# Patient Record
Sex: Male | Born: 1945 | Race: White | Hispanic: No | Marital: Single | Smoking: Former smoker
Health system: Southern US, Community
[De-identification: ages and names within clinical notes are randomized; demographics above are authoritative.]

## PROBLEM LIST (undated history)

## (undated) DIAGNOSIS — E119 Type 2 diabetes mellitus without complications: Secondary | ICD-10-CM

## (undated) DIAGNOSIS — I10 Essential (primary) hypertension: Secondary | ICD-10-CM

## (undated) DIAGNOSIS — L80 Vitiligo: Secondary | ICD-10-CM

## (undated) DIAGNOSIS — N189 Chronic kidney disease, unspecified: Secondary | ICD-10-CM

## (undated) DIAGNOSIS — J449 Chronic obstructive pulmonary disease, unspecified: Secondary | ICD-10-CM

## (undated) DIAGNOSIS — I4891 Unspecified atrial fibrillation: Secondary | ICD-10-CM

## (undated) HISTORY — DX: Vitiligo: L80

## (undated) HISTORY — DX: Essential (primary) hypertension: I10

## (undated) HISTORY — PX: MELANOMA EXCISION: SHX5266

## (undated) HISTORY — DX: Chronic obstructive pulmonary disease, unspecified: J44.9

## (undated) HISTORY — DX: Unspecified atrial fibrillation: I48.91

## (undated) HISTORY — DX: Type 2 diabetes mellitus without complications: E11.9

## (undated) HISTORY — DX: Chronic kidney disease, unspecified: N18.9

---

## 2010-06-24 ENCOUNTER — Emergency Department (HOSPITAL_COMMUNITY): Payer: BC Managed Care – PPO

## 2010-06-24 ENCOUNTER — Inpatient Hospital Stay (HOSPITAL_COMMUNITY)
Admission: EM | Admit: 2010-06-24 | Discharge: 2010-06-25 | DRG: 294 | Disposition: A | Discharge status: Home / Self Care | Payer: BC Managed Care – PPO | Attending: Internal Medicine | Admitting: Internal Medicine

## 2010-06-24 DIAGNOSIS — J40 Bronchitis, not specified as acute or chronic: Secondary | ICD-10-CM | POA: Diagnosis present

## 2010-06-24 DIAGNOSIS — Z9641 Presence of insulin pump (external) (internal): Secondary | ICD-10-CM

## 2010-06-24 DIAGNOSIS — E039 Hypothyroidism, unspecified: Secondary | ICD-10-CM | POA: Diagnosis present

## 2010-06-24 DIAGNOSIS — I10 Essential (primary) hypertension: Secondary | ICD-10-CM | POA: Diagnosis present

## 2010-06-24 DIAGNOSIS — E860 Dehydration: Secondary | ICD-10-CM | POA: Diagnosis present

## 2010-06-24 DIAGNOSIS — D72829 Elevated white blood cell count, unspecified: Secondary | ICD-10-CM | POA: Diagnosis present

## 2010-06-24 DIAGNOSIS — R748 Abnormal levels of other serum enzymes: Secondary | ICD-10-CM | POA: Diagnosis present

## 2010-06-24 DIAGNOSIS — J4489 Other specified chronic obstructive pulmonary disease: Secondary | ICD-10-CM | POA: Diagnosis present

## 2010-06-24 DIAGNOSIS — E1010 Type 1 diabetes mellitus with ketoacidosis without coma: Principal | ICD-10-CM | POA: Diagnosis present

## 2010-06-24 DIAGNOSIS — J449 Chronic obstructive pulmonary disease, unspecified: Secondary | ICD-10-CM | POA: Diagnosis present

## 2010-06-24 DIAGNOSIS — Z794 Long term (current) use of insulin: Secondary | ICD-10-CM

## 2010-06-24 LAB — DIFFERENTIAL
Blasts: 0 %
Metamyelocytes Relative: 0 %
Monocytes Absolute: 0.5 10*3/uL (ref 0.1–1.0)
Monocytes Relative: 3 % (ref 3–12)
Promyelocytes Absolute: 0 %
WBC Morphology: INCREASED
nRBC: 0 /100 WBC

## 2010-06-24 LAB — URINALYSIS, ROUTINE W REFLEX MICROSCOPIC
Glucose, UA: >1000 mg/dL — AB
Hgb urine dipstick: NEGATIVE
Leukocytes, UA: NEGATIVE
Specific Gravity, Urine: 1.026 (ref 1.005–1.030)
Urobilinogen, UA: 0.2 mg/dL (ref 0.0–1.0)

## 2010-06-24 LAB — BASIC METABOLIC PANEL
BUN: 36 mg/dL — ABNORMAL HIGH (ref 6–23)
BUN: 41 mg/dL — ABNORMAL HIGH (ref 6–23)
BUN: 40 mg/dL — ABNORMAL HIGH (ref 6–23)
CO2: 17 mEq/L — ABNORMAL LOW (ref 19–32)
CO2: 26 mEq/L (ref 19–32)
CO2: 25 mEq/L (ref 19–32)
Calcium: 7.9 mg/dL — ABNORMAL LOW (ref 8.4–10.5)
Calcium: 7.8 mg/dL — ABNORMAL LOW (ref 8.4–10.5)
Calcium: 7.8 mg/dL — ABNORMAL LOW (ref 8.4–10.5)
Chloride: 99 mEq/L (ref 96–112)
Chloride: 105 mEq/L (ref 96–112)
Creatinine, Ser: 1.61 mg/dL — ABNORMAL HIGH (ref 0.4–1.5)
Creatinine, Ser: 1.51 mg/dL — ABNORMAL HIGH (ref 0.4–1.5)
GFR calc Af Amer: 52 mL/min — ABNORMAL LOW (ref >60)
GFR calc non Af Amer: 43 mL/min — ABNORMAL LOW (ref >60)
GFR calc non Af Amer: 44 mL/min — ABNORMAL LOW (ref >60)
Glucose, Bld: 594 mg/dL (ref 70–99)
Glucose, Bld: 311 mg/dL — ABNORMAL HIGH (ref 70–99)
Glucose, Bld: 272 mg/dL — ABNORMAL HIGH (ref 70–99)
Glucose, Bld: 203 mg/dL — ABNORMAL HIGH (ref 70–99)
Potassium: 5.3 mEq/L — ABNORMAL HIGH (ref 3.5–5.1)
Potassium: 4.1 mEq/L (ref 3.5–5.1)
Sodium: 135 mEq/L (ref 135–145)
Sodium: 136 mEq/L (ref 135–145)

## 2010-06-24 LAB — RAPID URINE DRUG SCREEN, HOSP PERFORMED
Amphetamines: NOT DETECTED
Barbiturates: NOT DETECTED
Benzodiazepines: NOT DETECTED
Opiates: NOT DETECTED

## 2010-06-24 LAB — GLUCOSE, CAPILLARY
Glucose-Capillary: 339 mg/dL — ABNORMAL HIGH (ref 70–99)
Glucose-Capillary: 233 mg/dL — ABNORMAL HIGH (ref 70–99)
Glucose-Capillary: 210 mg/dL — ABNORMAL HIGH (ref 70–99)

## 2010-06-24 LAB — CARDIAC PANEL(CRET KIN+CKTOT+MB+TROPI)
CK, MB: 7.5 ng/mL (ref 0.3–4.0)
Total CK: 136 U/L (ref 7–232)

## 2010-06-24 LAB — CBC
HCT: 45.2 % (ref 39.0–52.0)
Hemoglobin: 15 g/dL (ref 13.0–17.0)
MCV: 97 fL (ref 78.0–100.0)
RBC: 4.66 MIL/uL (ref 4.22–5.81)
WBC: 16.6 10*3/uL — ABNORMAL HIGH (ref 4.0–10.5)

## 2010-06-24 LAB — PROTIME-INR
INR: 1.07 (ref 0.00–1.49)
Prothrombin Time: 14.1 seconds (ref 11.6–15.2)

## 2010-06-24 LAB — ETHANOL: Alcohol, Ethyl (B): <5 mg/dL (ref 0–10)

## 2010-06-24 LAB — URINE MICROSCOPIC-ADD ON

## 2010-06-25 ENCOUNTER — Inpatient Hospital Stay (HOSPITAL_COMMUNITY): Payer: BC Managed Care – PPO

## 2010-06-25 DIAGNOSIS — R7989 Other specified abnormal findings of blood chemistry: Secondary | ICD-10-CM

## 2010-06-25 LAB — CARDIAC PANEL(CRET KIN+CKTOT+MB+TROPI)
CK, MB: 9.4 ng/mL (ref 0.3–4.0)
CK, MB: 10.3 ng/mL (ref 0.3–4.0)
Relative Index: 5.3 — ABNORMAL HIGH (ref 0.0–2.5)
Total CK: 176 U/L (ref 7–232)
Total CK: 184 U/L (ref 7–232)

## 2010-06-25 LAB — DIFFERENTIAL
Basophils Relative: 0 % (ref 0–1)
Eosinophils Absolute: 0.3 10*3/uL (ref 0.0–0.7)
Eosinophils Relative: 2 % (ref 0–5)
Lymphs Abs: 1.9 10*3/uL (ref 0.7–4.0)
Monocytes Relative: 7 % (ref 3–12)

## 2010-06-25 LAB — SODIUM, URINE, RANDOM: Sodium, Ur: 33 mEq/L

## 2010-06-25 LAB — BASIC METABOLIC PANEL
BUN: 36 mg/dL — ABNORMAL HIGH (ref 6–23)
CO2: 22 mEq/L (ref 19–32)
CO2: 26 mEq/L (ref 19–32)
Calcium: 7.8 mg/dL — ABNORMAL LOW (ref 8.4–10.5)
Chloride: 107 mEq/L (ref 96–112)
Creatinine, Ser: 1.09 mg/dL (ref 0.4–1.5)
GFR calc Af Amer: >60 mL/min (ref >60)
Glucose, Bld: 296 mg/dL — ABNORMAL HIGH (ref 70–99)
Glucose, Bld: 283 mg/dL — ABNORMAL HIGH (ref 70–99)
Sodium: 138 mEq/L (ref 135–145)
Sodium: 137 mEq/L (ref 135–145)

## 2010-06-25 LAB — CBC
MCH: 31.5 pg (ref 26.0–34.0)
MCHC: 33.1 g/dL (ref 30.0–36.0)
MCV: 95.1 fL (ref 78.0–100.0)
Platelets: 263 10*3/uL (ref 150–400)
RDW: 13.8 % (ref 11.5–15.5)

## 2010-06-25 LAB — GLUCOSE, CAPILLARY: Glucose-Capillary: 193 mg/dL — ABNORMAL HIGH (ref 70–99)

## 2010-06-25 LAB — COMPREHENSIVE METABOLIC PANEL
Albumin: 3 g/dL — ABNORMAL LOW (ref 3.5–5.2)
BUN: 37 mg/dL — ABNORMAL HIGH (ref 6–23)
Calcium: 7.9 mg/dL — ABNORMAL LOW (ref 8.4–10.5)
Creatinine, Ser: 1.15 mg/dL (ref 0.4–1.5)
GFR calc Af Amer: >60 mL/min (ref >60)
Total Bilirubin: 1.2 mg/dL (ref 0.3–1.2)
Total Protein: 5.2 g/dL — ABNORMAL LOW (ref 6.0–8.3)

## 2010-06-25 LAB — TSH: TSH: 0.708 u[IU]/mL (ref 0.350–4.500)

## 2010-06-25 LAB — HEMOGLOBIN A1C
Hgb A1c MFr Bld: 10.1 % — ABNORMAL HIGH (ref <5.7)
Mean Plasma Glucose: 243 mg/dL — ABNORMAL HIGH (ref <117)

## 2010-06-26 LAB — URINE CULTURE

## 2010-06-27 NOTE — Discharge Summary (Signed)
  [name], GRAND               ACCOUNT NO.:  [id number]  MEDICAL RECORD NO.:  [id number]           PATIENT TYPE:  I  LOCATION:  1443                         FACILITY:  [facility]  PHYSICIAN:  Conley Canal, MD      DATE OF BIRTH:  February 21, 1946  DATE OF ADMISSION:  06/24/2010 DATE OF DISCHARGE:                         DISCHARGE SUMMARY-REFERRING   PRIMARY CARE PHYSICIAN:  [place], [place].  CONSULTING PHYSICIAN:  [facility].  DISCHARGE DIAGNOSES: 1. Diabetic ketoacidosis. 2. Acute bronchitis. 3. Leukocytosis, combination of #1 and #2. 4. Positive cardiac enzymes, possibly related to leak related to     diabetic ketoacidosis. 5. Hypertension. 6. Hypothyroidism.  DISCHARGE MEDICATIONS: 1. Levaquin 750 mg daily for 7 more days. 2. Advair 250/50 one puff twice daily. 3. Cardizem CD 120 mg daily. 4. Combivent 2 puffs twice daily. 5. Hydrochlorothiazide 50 mg daily. 6. Insulin pump per PCP instructions. 7. Spiriva 18 mcg inhalations daily. 8. Synthroid 50 mcg daily.  PROCEDURES PERFORMED:  Chest x-ray, June 24, 2010 and June 25, 2010, showed bronchitic changes with no evidence of pneumonia.  HOSPITAL COURSE:  This is a very pleasant unfortunate 65 year old male diabetic with a history of COPD, came to the hospital with complaints of feeling like he was in DKA.  He said that his insulin pump malfunctioned.  He also admitted to history of cough, nausea, vomiting, dry heaves, and at the time of admission, he was noted to have a white count of 16,000, blood sugar 594, and bicarbonate 17.  He was also in prerenal azotemia with a BUN of 36, creatinine 1.73.  The patient was hence appropriately started on IV fluids, insulin per protocol.  His cardiac enzymes were also positive, resulting in Cardiology consult. Cardiology felt that this was enzyme leak related to the DKA. Apparently, the patient had a cardiac cath about 1-1/2 years ago which was noted to be normal  in [place].  Today, the patient feels better. He is eager to go home because he lost his mom and funeral is scheduled for the morning and he has requested discharge to attend the funeral. At this point, he seems hemodynamically stable.  We will plan to discharge him and to have him follow with his PCP in the next week or 2. He was discharged on Levaquin for the bronchitis.  The patient was discharged in stable condition.  The time spent for discharge preparation is less than 30 minutes.     Conley Canal, MD     SR/MEDQ  D:  06/25/2010  T:  06/25/2010  Job:  [id number]  Electronically Signed by Conley Canal  on 06/26/2010 08:12:30 PM

## 2010-07-03 NOTE — H&P (Signed)
Arthur [name], Arthur NO.:  [id number]  MEDICAL RECORD NO.:  [id number]           PATIENT TYPE:  E  LOCATION:  WLED                         FACILITY:  [facility]  PHYSICIAN:  Ramiro Harvest, MD    DATE OF BIRTH:  Sep 28, 1945  DATE OF ADMISSION:  06/24/2010 DATE OF DISCHARGE:                             HISTORY & PHYSICAL   PRIMARY CARE PHYSICIAN:  Dr. Hope Budds of [place], [place].  CHIEF COMPLAINT:  "I think I have diabetic ketoacidosis."  HISTORY OF PRESENT ILLNESS:  Tremont Gavitt is a 65 year old Caucasian gentleman, a history of insulin-dependent diabetes x10-15 years on an insulin pump, history of hypertension, hypothyroidism, presenting to the ED saying "I'm in DKA."  The patient states his insulin pump has been malfunctioning for the past 2 days and he is here for his mother' funeral.  The patient has been having a 1-day history of nausea, weakness, dry heaves, and sore throat which has since resolved.  The patient is here from [place].  The patient states he checked his CBGs and they were in the 500s.  The patient also complaining of disorientation.  The patient denies any fever, no chills, no chest pain, no shortness of breath, no abdominal pain, no diarrhea, no constipation, no dysuria, no headache, no visual symptoms.  No melena, no hematemesis, no hematochezia.  No focal neurological symptoms.  The patient was seen in the ED.  Labs obtained; had a BMET with a potassium of 5.3, bicarb of 17, glucose of 594, BUN of 36, creatinine of 1.73.  CBC with a white count of 16.6; otherwise, was within normal limits.  Alcohol level of less than 5.  The patient was given some IV fluids.  Chest x-ray which was done showed chronic bronchitis and emphysema with no active disease. We were called to admit the patient for further evaluation and management.  ALLERGIES:  No known drug allergies.  PAST MEDICAL HISTORY: 1. Insulin-dependent diabetes  mellitus x10-15 years, on an insulin     pump. 2. Questionable component of asthma. 3. COPD. 4. Hypertension. 5. Hypothyroidism.  HOME MEDICATIONS: 1. Insulin pump. 2. Cardizem CD 120 mg p.o. q. daily. 3. Synthroid 50 mcg p.o. q.a.m. 4. HCTZ 50 mg p.o. q. daily. 5. Spiriva 18 mcg inhalation daily. 6. Combivent 2 puffs twice daily. 7. Advair Diskus 250/50 one puff twice daily.  SOCIAL HISTORY:  The patient lives in [place] and is here for his mother's funeral.  Smoked cigarettes for 25 years, but has quit for the past 20 years.  Drinks about two beers a day.  No IV drug use.  FAMILY HISTORY:  Mother deceased, age 40, from old age.  Father deceased, age 107, from carcinoma of the bladder/leukemia.  REVIEW OF SYSTEMS:  As per HPI, otherwise negative.  PHYSICAL EXAM:  VITAL SIGNS:  Temperature 98.5, blood pressure 122/59, pulse of 112, respiratory rate 18, satting 94%. GENERAL:  The patient is a well-developed, well-nourished gentleman in no acute cardiopulmonary distress. HEENT:  Normocephalic, atraumatic.  Pupils are equal, round, and reactive to light and accommodation.  Extraocular movements intact. Oropharynx is clear.  No  lesions, no exudates. NECK:  Supple.  No lymphadenopathy.  Extremely dry mucous membranes. RESPIRATORY:  Lungs are clear to auscultation bilaterally.  No wheezes, no crackles, no rhonchi. CARDIOVASCULAR:  Tachycardic, regular rhythm. ABDOMEN:  Soft, nontender, nondistended.  Positive bowel sounds. EXTREMITIES:  No clubbing, cyanosis, or edema. NEUROLOGICALLY:  The patient is alert and oriented x3.  Cranial nerves 2- 12 are grossly intact with no focal deficits.  ADMISSION LABS:  CBC:  White count of 16.6, hemoglobin 15.0, hematocrit 45.2, platelet count of 242 with an ANC of 13.8.  BMET with a sodium of 135, potassium 5.3, chloride 99, bicarb 17, glucose 594, BUN 36, creatinine 1.73, and a calcium of 8.4.  Alcohol level of less than 5.  Chest x-ray  shows chronic bronchitis and emphysema.  No active disease.  ASSESSMENT AND PLAN:  Mr. Ronrico Dupin is a 65 year old gentleman, a history of insulin-dependent type 1 diabetes x10-15 years on an insulin pump presenting to the emergency department stating that he is in early diabetic ketoacidosis. 1. Mild early diabetic ketoacidosis.  The patient does have an anion     gap of 19.  It is likely secondary to his malfunctioning insulin     pump versus infectious etiology versus acute coronary syndrome.     Will admit the patient to Telemetry.  Will place on a glucose     stabilizer.  Will cycle cardiac enzymes q.8 h. x3.  Check a     urinalysis with cultures and sensitivities.  Check a chest x-ray;     it is negative.  Will repeat chest x-ray in the morning.  Once the     patient is hydrated, check the lipase level.  Check serum ketones.     Check a hemoglobin A1c.  Check a magnesium level.  Check CBGs q.1     hour.  Check a BMET q.2 hours.  Follow glucose stabilizer, IV     fluids, and monitor.  Once his anion gap is closed, we will overlap     with 35 units of Lantus follow and diabetes education. 2. Dehydration.  Hydrate with intravenous fluids. 3. Leukocytosis.  Likely a reactive leukocytosis.  Will check a     urinalysis with cultures and sensitivities.  Check a chest x-ray;     it has been negative.  We will repeat chest x-ray in the morning to     rule out pneumonia.  Once the patient is hydrated, we will monitor     to make sure nothing fluffs out.  Will hold off on antibiotics for     now and follow. 4. Insulin-dependent diabetes mellitus type 1 x10-15 years.  Will     check a hemoglobin A1c, sliding scale insulin.  See problem #1. 5. Chronic obstructive pulmonary disease, stable.  Continue Advair,     Spiriva, and Combivent. 6. Hypothyroidism.  Check a TSH.  Continue Synthroid. 7. Acute renal failure.  Likely secondary to prerenal azotemia     secondary to problems #1 and #2 with  underlying diuretic use.  Will     check a fractional excretion of sodium hydrate with intravenous     fluids.  If no improvement, check a renal ultrasound. 8. Hyperkalemia secondary to #1.  Should correct with the treatment of     diabetic ketoacidosis.  Will follow for     now. 9. Prophylaxis.  Pepcid for gastrointestinal prophylaxis.  Heparin for     deep vein thrombosis prophylaxis.  It has been  a pleasure taking care of Mr. Shloma Roggenkamp.     Ramiro Harvest, MD     DT/MEDQ  D:  06/24/2010  T:  06/24/2010  Job:  [id number]  cc:   Dr. Gwendolyn Grant, [facility]  Electronically Signed by Ramiro Harvest MD on 07/03/2010 08:11:31 PM

## 2010-07-09 NOTE — Consult Note (Signed)
[name], [name]               ACCOUNT NO.:  [id number]  MEDICAL RECORD NO.:  [id number]           PATIENT TYPE:  I  LOCATION:  1443                         FACILITY:  [facility]  PHYSICIAN:  Colleen Can. Deborah Chalk, M.D.DATE OF BIRTH:  03-Mar-1946  DATE OF CONSULTATION:  06/25/2010 DATE OF DISCHARGE:                                CONSULTATION   HISTORY OF PRESENT ILLNESS:  Thank you much for asking me to see Arthur [name].  Mr. Goeken is a 65 year old male with history of insulin-dependent diabetes mellitus for approximately 15 years and on insulin pump.  He has a history of hypertension and hypothyroidism.  He was in [place] with the death of his mother.  He had malfunction of his tubing of his insulin pump and before he realized he was in mild DKA and presented with a bicarb of 17 with a glucose of 594 and BUN of 36.  He was given IV fluids.  Subsequent cardiac enzymes came back mildly elevated and I am asked to see him in this regard.  His cardiac enzymes had shown an MB fraction that started at 7.5 and increased to 9.4 and subsequently 10.3. Troponins have been reasonably consisted of 0.12, 0.16, and 0.11.  EKG has remained normal.  It is interesting by history he had a similar DKA admission approximately a year and a half ago and had positive cardiac enzymes and had catheterizations at that time and by his description was normal.  ALLERGIES:  None.  PAST MEDICAL HISTORY: 1. Diabetes for 10-15 years. 2. Questionable COPD and asthma. 3. Hypertension. 4. Hypothyroidism.  HOME MEDICINES: 1. Insulin pump. 2. Cardizem CD 120 per day. 3. Synthroid 50 mcg p.o. daily. 4. HCTZ 50 mg daily. 5. Spiriva, Combivent, and Advair.  SOCIAL HISTORY:  His father was positioning in [place] for a long time.  We smoke about him.  His mother recently died.  He smokes cigarettes for 25 years but stopped for last 20.  He drinks about 2 beers a day.  FAMILY HISTORY:  His father died of  leukemia.  Mother died at 53 from old age.  REVIEW OF SYSTEMS:  As noted above.  PHYSICAL EXAMINATION:  VITAL SIGNS:  Temperature is 98, blood pressure is 122/59, heart rate 70. HEENT:  He is normocephalic and atraumatic.  Pupils equal, round, and reactive to light. LUNGS:  Clear. HEART:  Regular rate and rhythm. ABDOMEN:  Soft, nontender. EXTREMITIES:  Without edema.  OVERALL ASSESSMENT: 1. Positive cardiac enzymes related to diabetic ketoacidosis. 2. Insulin-dependent diabetes mellitus. 3. Chronic obstructive pulmonary disease. 4. Hypothyroidism. 5. History of hypertension.  PLAN:  In light of the findings, this is all consistent with DKA.  I have especially reassured him the fact that he had normal catheterization within the last 2 years and predicted associated with DKA.  I think it is reasonable for him to be discharged at this point in time with no further followup.     Colleen Can. Deborah Chalk, M.D.     SNT/MEDQ  D:  06/25/2010  T:  06/26/2010  Job:  [id number]  cc:   Hope Budds, M.D. [employer]  Electronically Signed by Roger Shelter M.D. on 07/09/2010 04:16:31 PM

## 2017-06-01 ENCOUNTER — Encounter (HOSPITAL_COMMUNITY): Payer: Self-pay

## 2017-06-01 ENCOUNTER — Telehealth (HOSPITAL_COMMUNITY): Payer: Self-pay
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
## 2017-06-07 ENCOUNTER — Telehealth (HOSPITAL_COMMUNITY): Payer: Self-pay

## 2017-06-08 ENCOUNTER — Telehealth (HOSPITAL_COMMUNITY): Payer: Self-pay

## 2017-06-08 ENCOUNTER — Encounter (HOSPITAL_COMMUNITY): Payer: Self-pay

## 2017-06-08 NOTE — Progress Notes (Signed)
Patient called back to discuss pulmonary rehab referral. Stated that he no longer wants to do the "undergrad" program and request to do the maintenance pulmonary rehab program. RN questioned patient because patient had stated in previous conversation that he could not afford 100.00/month for maintenance program and "wanted it all paid for". Patient also stated in previous conversation that [place] was too costly and [facility] only charged 30.00/month. Patient stated that RN misunderstood previous conversation and he was willing to pay whatever he had to to come to the "graduate" (maintenance) program. RN explained to patient that she would have to check with Maintenance RN coordinator to see if there was a spot available in the class, a new order would need to be signed by the MD for Pulmonary Rehab Maintenance, and maintenance classes were only on Tuesdays and Thursdays at 0930. Patient became agitated and stated "you mean to tell me I cant come on a Saturday or any other day". RN explained that we do not have an open gym and M,W,and F were Cardiac Rehab days. RN confirmed with patient that she would discard undergrad order and seek order from MD for maintenance. Patient said "NO, I am coming to the program. You call me when you have something for me" and hung up the phone on the RN.

## 2018-11-14 ENCOUNTER — Encounter: Payer: Self-pay | Admitting: Internal Medicine

## 2018-11-14 ENCOUNTER — Other Ambulatory Visit: Payer: Self-pay

## 2018-11-14 ENCOUNTER — Ambulatory Visit (INDEPENDENT_AMBULATORY_CARE_PROVIDER_SITE_OTHER): Payer: Medicare Other | Admitting: Internal Medicine

## 2018-11-14 DIAGNOSIS — J449 Chronic obstructive pulmonary disease, unspecified: Secondary | ICD-10-CM | POA: Diagnosis not present

## 2018-11-14 DIAGNOSIS — J9611 Chronic respiratory failure with hypoxia: Secondary | ICD-10-CM

## 2018-11-14 MED ORDER — TRELEGY ELLIPTA 100-62.5-25 MCG/INH IN AEPB: 1.0000 | INHALATION_SPRAY | Freq: Every day | RESPIRATORY_TRACT | 0 refills | Status: DC

## 2018-11-14 MED ORDER — TRELEGY ELLIPTA 100-62.5-25 MCG/INH IN AEPB: 1.0000 | INHALATION_SPRAY | Freq: Every day | RESPIRATORY_TRACT | 1 refills | Status: DC

## 2018-11-14 MED ORDER — FLUTTER DEVI: 0 refills | Status: DC

## 2018-11-14 NOTE — Progress Notes (Addendum)
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 Pulmonary Consult  Dyspnea:  Indolent onset progressive doe abruptly worse winter of 2019  s pattern of aecopd :  Baseline= Walks up to 5lpm pulsed  Does not check sat level At rest no sob. Cough: esp in am very thick white.  Sleep: bipap at hs /3lpm SABA use: alb 3 x daily Bad cns effects of prednisone in past     No obvious day to day or daytime variability or assoc  purulent sputum or mucus plugs or hemoptysis or cp or chest tightness, subjective wheeze or overt sinus or hb symptoms.   Sleeping as above without nocturnal  or early am exacerbation  of respiratory  c/o's or need for noct saba. Also denies any obvious fluctuation of symptoms with weather or environmental changes or other aggravating or alleviating factors except as outlined above   No unusual exposure hx or h/o childhood pna/ asthma or knowledge of premature birth.  Current Allergies, Complete Past Medical History, Past Surgical History, Family History, and Social History were reviewed in [employer] record.  ROS  The following are not active complaints unless bolded Hoarseness, sore throat, dysphagia, dental problems, itching, sneezing,  nasal congestion or discharge of excess mucus or purulent secretions, ear ache,   fever, chills, sweats, unintended wt loss or wt gain, classically pleuritic or exertional cp,  orthopnea pnd or arm/hand swelling  or leg swelling, presyncope, palpitations, abdominal pain, anorexia, nausea, vomiting, diarrhea  or change in bowel habits or  change in bladder habits, change in stools or change in urine, dysuria, hematuria,  rash, arthralgias, visual complaints, headache, numbness, weakness or ataxia or problems with walking or coordination,  change in mood or  Memory. Tremor worse with saba             No past medical history on file.  Outpatient Medications Prior to Visit  Medication Sig Dispense Refill  . albuterol (VENTOLIN HFA) 108 (90 Base) MCG/ACT inhaler Inhale 2 puffs into the lungs every 6 (six) hours as needed for wheezing or shortness of breath.    . diltiazem (CARDIZEM) 120 MG tablet Take 120 mg by mouth daily.    . flecainide (TAMBOCOR) 100 MG tablet Take 100 mg by mouth 2 (two) times daily.    . Fluticasone-Salmeterol (ADVAIR) 500-50 MCG/DOSE AEPB Inhale 1 puff into the lungs 2 (two) times daily.    . insulin aspart (NOVOLOG) 100 UNIT/ML injection Inject into the skin as directed.    Marland Kitchen levothyroxine (SYNTHROID) 88 MCG tablet Take 88 mcg by mouth daily before breakfast.    . OXYGEN 3lpm with exertion    . potassium chloride SA (K-DUR) 20 MEQ tablet Take 20 mEq by mouth daily.    Marland Kitchen SPIRIVA HANDIHALER 18 MCG inhalation capsule Place 1 capsule into inhaler and inhale daily.    . temazepam (RESTORIL) 30 MG capsule Take 30 mg by mouth at bedtime as needed for sleep.    Marland Kitchen torsemide (DEMADEX) 10 MG tablet  Take 10 mg by mouth daily.    Marland Kitchen UNABLE TO FIND Med Name: BIPAP with 3lpm o2    . Tiotropium Bromide Monohydrate (SPIRIVA RESPIMAT) 1.25 MCG/ACT AERS Inhale into the lungs.        Objective:     BP (!) 126/58 (BP Location: Left Arm, Cuff Size: Normal)   Pulse 85   Temp 97.9 F (36.6 C) (Oral)   Ht 5\' 10"  (1.778 m)   Wt 149 lb (67.6 kg)   SpO2 96%   BMI 21.38 kg/m   SpO2: 96 % O2 Type: Continuous O2 O2 Flow Rate (L/min): 3 L/min     HEENT: nl dentition / oropharynx. Nl external ear canals without cough reflex -  Mild bilateral non-specific turbinate edema     NECK :  without JVD/Nodes/TM/ nl carotid  upstrokes bilaterally   LUNGS: no acc muscle use,  Mod barrel  contour chest wall with bilateral  Distant bs s audible wheeze and  without cough on insp or exp maneuver and mod  Hyperresonant  to  percussion bilaterally     CV:  RRR  no s3 or murmur or increase in P2, and no edema   ABD:  soft and nontender with pos mid insp Hoover's  in the supine position. No bruits or organomegaly appreciated, bowel sounds nl  MS:     ext warm without deformities, calf tenderness, cyanosis or clubbing No obvious joint restrictions   SKIN: warm and dry without lesions    NEURO:  alert, approp, nl sensorium with  no motor   deficits apparent - resting tremor noted.       Dr south's office cxr "a month or two ago" ok per pt       Assessment   COPD GOLD ?  Quit smoking 1990  - w/u by Dr Koren Bound [place] ? Alpha one AT status  - 11/14/2018  After extensive coaching inhaler device,  effectiveness =    90% with elipta so changed to Trelegy - 11/14/2018 trained on flutter valve use   DDX of  difficult airways management almost all start with A and  include Adherence, Ace Inhibitors, Acid Reflux, Active Sinus Disease, Alpha 1 Antitripsin deficiency, Anxiety masquerading as Airways dz,  ABPA,  Allergy(esp in young), Aspiration (esp in elderly), Adverse effects of meds,  Active smoking or vaping, A bunch of PE's (a small clot burden can't cause this syndrome unless there is already severe underlying pulm or vascular dz with poor reserve) plus two Bs  = Bronchiectasis and Beta blocker use..and one C= CHF  Adherence is always the initial "prime suspect" and is a multilayered concern that requires a "trust but verify" approach in every patient - starting with knowing how to use medications, especially inhalers, correctly, keeping up with refills and understanding the fundamental difference between maintenance and prns vs those medications only taken for a very short course and then stopped and not refilled.   - see hfa teaching - return with all meds in hand using a trust but verify approach to confirm accurate Medication  Reconciliation The principal here is that until we are certain that the  patients are doing what we've asked, it makes no sense to ask them to do more.    ? Adverse effects of meds, esp high dose advair /spiriva dpi > d/c both and try trelegy sample, fill if tol better   ? Alpha one AT def > need to be sure screening done, recs requested   ?  Anxiety/depression/ deconditioning  > usually at the bottom of this list of usual suspects but should be included may interfere with adherence and also interpretation of response or lack thereof to symptom management which can be quite subjective.   ? Allergy/ asthma component > unlikely s tendency at aecopd   ? Bronchiectasis > add flutter valve empirically, consider hrct next ov       Chronic respiratory failure with hypoxia ([place]) 0 2 dep 24/7 @ 3lpm baseline, up to 5 lpm with activity as of 11/14/2018   Adequate control on present rx, reviewed in detail with pt > no change in rx needed  But advised to monitor sats walking     Total time devoted to counseling  > 50 % of initial 60 min office visit:  reviewedcase with pt/  performed device teaching  using a teach back technique which also  extended face to face time for this visit (see above) discussion of options/alternatives/ personally creating written customized instructions  in presence of pt  then going over those specific  Instructions directly with the pt including how to use all of the meds but in particular covering each new medication in detail and the difference between the maintenance= "automatic" meds and the prns using an action plan format for the latter (If this problem/symptom => do that organization reading Left to right).  Please see AVS from this visit for a full list of these instructions which I personally wrote for this pt and  are unique to this visit.       Christinia Gully, MD 11/14/2018

## 2018-11-14 NOTE — Patient Instructions (Addendum)
Plan A = Automatic =  Trelegy one click each am  - take 2 good drags   Plan B = Backup for breathing Only use your albuterol inhaler as a rescue medication to be used if you can't catch your breath by resting or doing a relaxed purse lip breathing pattern.  - The less you use it, the better it will work when you need it. - Ok to use the inhaler up to 2 puffs  every 4 hours if you must but call for appointment if use goes up over your usual need - Don't leave home without it !!  (think of it like the spare tire for your car)     Plan B = backup for cough mucinex or mucinex dm up to 1200 mg every 12 hours and use the flutter valve as possible    Please schedule a follow up visit in 3 months but call sooner if needed for pfts  - add needs alpha one screening on return

## 2018-11-15 ENCOUNTER — Encounter: Payer: Self-pay | Admitting: Internal Medicine

## 2018-11-15 DIAGNOSIS — J9611 Chronic respiratory failure with hypoxia: Secondary | ICD-10-CM | POA: Insufficient documentation

## 2018-11-15 NOTE — Assessment & Plan Note (Addendum)
Quit smoking 1990  - w/u by Dr Koren [place] ? Alpha one AT status  - 11/14/2018  After extensive coaching inhaler device,  effectiveness =    90% with elipta so changed to Trelegy - 11/14/2018 trained on flutter valve use   DDX of  difficult airways management almost all start with A and  include Adherence, Ace Inhibitors, Acid Reflux, Active Sinus Disease, Alpha 1 Antitripsin deficiency, Anxiety masquerading as Airways dz,  ABPA,  Allergy(esp in young), Aspiration (esp in elderly), Adverse effects of meds,  Active smoking or vaping, A bunch of PE's (a small clot burden can't cause this syndrome unless there is already severe underlying pulm or vascular dz with poor reserve) plus two Bs  = Bronchiectasis and Beta blocker use..and one C= CHF  Adherence is always the initial "prime suspect" and is a multilayered concern that requires a "trust but verify" approach in every patient - starting with knowing how to use medications, especially inhalers, correctly, keeping up with refills and understanding the fundamental difference between maintenance and prns vs those medications only taken for a very short course and then stopped and not refilled.  - see hfa teaching - return with all meds in hand using a trust but verify approach to confirm accurate Medication  Reconciliation The principal here is that until we are certain that the  patients are doing what we've asked, it makes no sense to ask them to do more.    ? Adverse effects of meds, esp high dose advair /spiriva dpi > d/c both and try trelegy sample, fill if tol better   ? Alpha one AT def > need to be sure screening done, recs requested   ? Anxiety/depression/ deconditioning  > usually at the bottom of this list of usual suspects but should be included may interfere with adherence and also interpretation of response or lack thereof to symptom management which can be quite subjective.   ? Allergy/ asthma component > unlikely s tendency at  aecopd   ? Bronchiectasis > add flutter valve empirically, consider hrct next ov

## 2018-11-15 NOTE — Assessment & Plan Note (Signed)
0 2 dep 24/7 @ 3lpm baseline, up to 5 lpm with activity as of 11/14/2018   Adequate control on present rx, reviewed in detail with pt > no change in rx needed  But advised to monitor sats walking     Total time devoted to counseling  > 50 % of initial 60 min office visit:  reviewedcase with pt/  performed device teaching  using a teach back technique which also  extended face to face time for this visit (see above) discussion of options/alternatives/ personally creating written customized instructions  in presence of pt  then going over those specific  Instructions directly with the pt including how to use all of the meds but in particular covering each new medication in detail and the difference between the maintenance= "automatic" meds and the prns using an action plan format for the latter (If this problem/symptom => do that organization reading Left to right).  Please see AVS from this visit for a full list of these instructions which I personally wrote for this pt and  are unique to this visit.

## 2018-11-17 ENCOUNTER — Ambulatory Visit (INDEPENDENT_AMBULATORY_CARE_PROVIDER_SITE_OTHER): Payer: Medicare Other | Admitting: Internal Medicine

## 2018-11-17 ENCOUNTER — Other Ambulatory Visit: Payer: Self-pay

## 2018-11-17 ENCOUNTER — Encounter: Payer: Self-pay | Admitting: Internal Medicine

## 2018-11-17 ENCOUNTER — Encounter

## 2018-11-17 VITALS — BP 144/60 | HR 82 | Temp 98.4°F | Ht 70.0 in | Wt 146.6 lb

## 2018-11-17 DIAGNOSIS — E782 Mixed hyperlipidemia: Secondary | ICD-10-CM | POA: Diagnosis not present

## 2018-11-17 DIAGNOSIS — R0602 Shortness of breath: Secondary | ICD-10-CM

## 2018-11-17 DIAGNOSIS — I4891 Unspecified atrial fibrillation: Secondary | ICD-10-CM

## 2018-11-17 DIAGNOSIS — I10 Essential (primary) hypertension: Secondary | ICD-10-CM | POA: Diagnosis not present

## 2018-11-17 MED ORDER — APIXABAN 5 MG PO TABS: 5.0000 mg | ORAL_TABLET | Freq: Two times a day (BID) | ORAL | 11 refills | Status: DC

## 2018-11-17 NOTE — Progress Notes (Signed)
OFFICE CONSULT NOTE  Chief Complaint:  Establish cardiologist  Primary Care Physician: Bernerd Limbo, MD  HPI:  Arthur [name] is a 73 y.o. male who is being seen today for the evaluation of establish cardiologist at the request of Reynold Bowen, MD.  [name] is a pleasant 73 year old male who is originally from [place] but had been living in [place] for the past 20 years.  He had cardiac care there by cardiologist who has been following him for paroxysmal atrial fibrillation which appears to be remote as well as an episode of SVT.  When he was in [place] number years ago apparently underwent heart catheterization in 2010 which was showed no obstructive coronary disease.  Despite the fact that he has had paroxysmal A. fib, he is not anticoagulated.  He said this was actually his choosing.  He was concerned about the side effects of anticoagulation.  He had previously been on digoxin and flecainide however he stopped his digoxin as his levels were consistently subtherapeutic and he felt that it was not helpful.  He remains on flecainide 100 mg twice daily and diltiazem.  He does not take aspirin.  His chads vas score is 3 or 4 for age, hypertension, diabetes, but no prior stroke.  There is questionable history of vascular disease.  He does have significant COPD and is followed by Dr. work.  He drinks about 3-5 bourbons a week and used to smoke about a pack per day.  He is now on oxygen.  EKG today shows normal sinus rhythm.  PMHx:  Past Medical History:  Diagnosis Date  . Atrial fibrillation ([place])   . Chronic kidney disease   . COPD (chronic obstructive pulmonary disease) ([place])   . Hypertension   . Type 2 diabetes mellitus ([place])   . Vitiligo     History reviewed. No pertinent surgical history.  FAMHx:  Family History  Problem Relation Age of Onset  . Stroke Mother   . Cancer Father     SOCHx:   reports that he quit smoking about 30 years ago. He has a 37.50 pack-year  smoking history. He has never used smokeless tobacco. No history on file for alcohol and drug.  ALLERGIES:  Allergies  Allergen Reactions  . Mefloquine Hcl Nausea And Vomiting    ROS: Pertinent items noted in HPI and remainder of comprehensive ROS otherwise negative.  HOME MEDS: Current Outpatient Medications on File Prior to Visit  Medication Sig Dispense Refill  . albuterol (VENTOLIN HFA) 108 (90 Base) MCG/ACT inhaler Inhale 2 puffs into the lungs every 6 (six) hours as needed for wheezing or shortness of breath.    . diltiazem (CARDIZEM) 120 MG tablet Take 120 mg by mouth daily.    . flecainide (TAMBOCOR) 100 MG tablet Take 100 mg by mouth 2 (two) times daily.    . Fluticasone-Umeclidin-Vilant (TRELEGY ELLIPTA) 100-62.5-25 MCG/INH AEPB Inhale 1 puff into the lungs daily. 60 each 1  . hydrochlorothiazide (MICROZIDE) 12.5 MG capsule Take 12.5 mg by mouth daily.    . insulin aspart (NOVOLOG) 100 UNIT/ML injection Inject into the skin as directed.    Marland Kitchen levothyroxine (SYNTHROID) 88 MCG tablet Take 88 mcg by mouth daily before breakfast.    . OXYGEN 3lpm with exertion    . potassium chloride SA (K-DUR) 20 MEQ tablet Take 20 mEq by mouth daily.    Marland Kitchen Respiratory Therapy Supplies (FLUTTER) DEVI Use as directed 1 each 0  . temazepam (RESTORIL) 30 MG capsule Take 30  mg by mouth at bedtime as needed for sleep.    Marland Kitchen torsemide (DEMADEX) 10 MG tablet Take 10 mg by mouth daily.    Marland Kitchen UNABLE TO FIND Med Name: BIPAP with 3lpm o2     No current facility-administered medications on file prior to visit.     LABS/IMAGING: No results found for this or any previous visit (from the past 48 hour(s)). No results found.  LIPID PANEL: No results found for: CHOL, TRIG, HDL, CHOLHDL, VLDL, LDLCALC, LDLDIRECT  WEIGHTS: Wt Readings from Last 3 Encounters:  11/17/18 146 lb 9.6 oz (66.5 kg)  11/14/18 149 lb (67.6 kg)    VITALS: BP (!) 144/60   Pulse 82   Temp 98.4 F (36.9 C)   Ht 5\' 10"  (1.778 m)    Wt 146 lb 9.6 oz (66.5 kg)   BMI 21.03 kg/m   EXAM: General appearance: alert and no distress Neck: no carotid bruit, no JVD and thyroid not enlarged, symmetric, no tenderness/mass/nodules Lungs: clear to auscultation bilaterally Heart: regular rate and rhythm, S1, S2 normal and systolic murmur: early systolic 2/6, crescendo at 2nd right intercostal space Abdomen: soft, non-tender; bowel sounds normal; no masses,  no organomegaly Extremities: extremities normal, atraumatic, no cyanosis or edema Pulses: 2+ and symmetric Skin: Skin color, texture, turgor normal. No rashes or lesions Neurologic: Grossly normal Psych: Pleasant  EKG: Normal sinus rhythm 82- personally reviewed  ASSESSMENT: 1. Paroxysmal atrial fibrillation-CHADSVASC score of 4 2. Flecainide antiarrhythmic therapy 3. COPD on oxygen 4. Hypertension 5. Dyslipidemia 6. Type 2 diabetes 7. Chronic kidney disease  PLAN: 1.   Mr. Lauricella has a number of medical problems.  He does have a remote history of A. fib with probably a low burden however a high CHADSVASC score of 4.  Based on this I would recommend anticoagulation.  He is agreeable to Eliquis and will start 5 mg twice daily.  He is not on aspirin which is reasonable.  He is on oxygen for COPD and recently had a switch to Trelegy by Dr. Melvyn Novas which she says is helping significantly.  He also has type 2 diabetes, hypertension and dyslipidemia.  I will review records from his prior cardiologist however would like to repeat an echo as he has had some shortness of breath and does have a murmur.  I will contact him with results of the study but otherwise plan on follow-up in 6 months.  Pixie Casino, MD, [facility], [place] Director of the Advanced Lipid Disorders &  Cardiovascular Risk Reduction Clinic Diplomate of the American Board of Clinical Lipidology Attending Cardiologist  Direct Dial: [phone]  Fax: [phone]  Website:   [email or web address] 11/17/2018, 4:59 PM

## 2018-11-17 NOTE — Patient Instructions (Signed)
Medication Instructions:  START eliquis 5mg  twice daily  If you need a refill on your cardiac medications before your next appointment, please call your pharmacy.   Testing/Procedures: Your physician has requested that you have an echocardiogram - [address] - [address]rd Floor. Echocardiography is a painless test that uses sound waves to create images of your heart. It provides your doctor with information about the size and shape of your heart and how well your heart's chambers and valves are working. This procedure takes approximately one hour. There are no restrictions for this procedure.  Follow-Up: At [facility], you and your health needs are our priority.  As part of our continuing mission to provide you with exceptional heart care, we have created designated Provider Care Teams.  These Care Teams include your primary Cardiologist (physician) and Advanced Practice Providers (APPs -  Physician Assistants and Nurse Practitioners) who all work together to provide you with the care you need, when you need it. You will need a follow up appointment in 6 months.  Please call our office 2 months in advance to schedule this appointment.  You may see Dr. Debara Pickett or one of the following Advanced Practice Providers on your designated Care Team: Almyra Deforest, [place] . Fabian Sharp, PA-C  Any Other Special Instructions Will Be Listed Below (If Applicable).

## 2018-11-25 ENCOUNTER — Ambulatory Visit (HOSPITAL_COMMUNITY): Payer: Medicare Other | Attending: Internal Medicine

## 2018-11-25 ENCOUNTER — Other Ambulatory Visit: Payer: Self-pay

## 2018-11-25 DIAGNOSIS — R0602 Shortness of breath: Secondary | ICD-10-CM | POA: Insufficient documentation

## 2018-11-25 DIAGNOSIS — I4891 Unspecified atrial fibrillation: Secondary | ICD-10-CM | POA: Diagnosis present

## 2019-01-16 ENCOUNTER — Other Ambulatory Visit: Payer: Self-pay | Admitting: Internal Medicine

## 2019-02-14 ENCOUNTER — Ambulatory Visit: Payer: BLUE CROSS/BLUE SHIELD | Admitting: Internal Medicine

## 2019-02-21 ENCOUNTER — Other Ambulatory Visit (HOSPITAL_COMMUNITY)
Admission: RE | Admit: 2019-02-21 | Discharge: 2019-02-21 | Disposition: A | Discharge status: Home / Self Care | Payer: Medicare Other | Source: Ambulatory Visit | Attending: Internal Medicine | Admitting: Internal Medicine

## 2019-02-21 DIAGNOSIS — Z01812 Encounter for preprocedural laboratory examination: Secondary | ICD-10-CM | POA: Insufficient documentation

## 2019-02-21 DIAGNOSIS — Z20828 Contact with and (suspected) exposure to other viral communicable diseases: Secondary | ICD-10-CM | POA: Insufficient documentation

## 2019-02-22 LAB — NOVEL CORONAVIRUS, NAA (HOSP ORDER, SEND-OUT TO REF LAB; TAT 18-24 HRS): SARS-CoV-2, NAA: NOT DETECTED

## 2019-02-22 NOTE — Progress Notes (Signed)
Left detailed msg on machine with results

## 2019-02-23 ENCOUNTER — Other Ambulatory Visit: Payer: Self-pay | Admitting: Internal Medicine

## 2019-02-23 DIAGNOSIS — J449 Chronic obstructive pulmonary disease, unspecified: Secondary | ICD-10-CM

## 2019-02-24 ENCOUNTER — Other Ambulatory Visit: Payer: Self-pay

## 2019-02-24 ENCOUNTER — Encounter: Payer: Self-pay | Admitting: Internal Medicine

## 2019-02-24 ENCOUNTER — Ambulatory Visit (INDEPENDENT_AMBULATORY_CARE_PROVIDER_SITE_OTHER): Payer: Medicare Other | Admitting: Internal Medicine

## 2019-02-24 DIAGNOSIS — J449 Chronic obstructive pulmonary disease, unspecified: Secondary | ICD-10-CM | POA: Diagnosis not present

## 2019-02-24 DIAGNOSIS — J9611 Chronic respiratory failure with hypoxia: Secondary | ICD-10-CM | POA: Diagnosis not present

## 2019-02-24 LAB — CBC WITH DIFFERENTIAL/PLATELET
Basophils Absolute: 0.1 10*3/uL (ref 0.0–0.1)
Basophils Relative: 0.8 % (ref 0.0–3.0)
Eosinophils Absolute: 0.2 10*3/uL (ref 0.0–0.7)
Eosinophils Relative: 3.5 % (ref 0.0–5.0)
HCT: 46.7 % (ref 39.0–52.0)
Hemoglobin: 15.5 g/dL (ref 13.0–17.0)
Lymphocytes Relative: 13.6 % (ref 12.0–46.0)
Lymphs Abs: 0.9 10*3/uL (ref 0.7–4.0)
MCHC: 33.3 g/dL (ref 30.0–36.0)
MCV: 98.2 fl (ref 78.0–100.0)
Monocytes Absolute: 0.7 10*3/uL (ref 0.1–1.0)
Monocytes Relative: 9.8 % (ref 3.0–12.0)
Neutro Abs: 4.9 10*3/uL (ref 1.4–7.7)
Neutrophils Relative %: 72.3 % (ref 43.0–77.0)
Platelets: 302 10*3/uL (ref 150.0–400.0)
RBC: 4.76 Mil/uL (ref 4.22–5.81)
RDW: 15.2 % (ref 11.5–15.5)
WBC: 6.7 10*3/uL (ref 4.0–10.5)

## 2019-02-24 LAB — PULMONARY FUNCTION TEST
DL/VA % pred: 45 %
DL/VA: 1.84 ml/min/mmHg/L
DLCO unc % pred: 38 %
DLCO unc: 9.89 ml/min/mmHg
FEF 25-75 Post: 0.4 L/sec
FEF 25-75 Pre: 0.42 L/sec
FEF2575-%Change-Post: -3 %
FEF2575-%Pred-Post: 16 %
FEF2575-%Pred-Pre: 16 %
FEV1-%Change-Post: 2 %
FEV1-%Pred-Post: 29 %
FEV1-%Pred-Pre: 29 %
FEV1-Post: 0.97 L
FEV1-Pre: 0.95 L
FEV1FVC-%Change-Post: 1 %
FEV1FVC-%Pred-Pre: 51 %
FEV6-%Change-Post: -2 %
FEV6-%Pred-Post: 56 %
FEV6-%Pred-Pre: 57 %
FEV6-Post: 2.35 L
FEV6-Pre: 2.4 L
FEV6FVC-%Change-Post: -2 %
FEV6FVC-%Pred-Post: 98 %
FEV6FVC-%Pred-Pre: 101 %
FVC-%Change-Post: 0 %
FVC-%Pred-Post: 57 %
FVC-%Pred-Pre: 56 %
FVC-Post: 2.53 L
FVC-Pre: 2.52 L
Post FEV1/FVC ratio: 38 %
Post FEV6/FVC ratio: 93 %
Pre FEV1/FVC ratio: 38 %
Pre FEV6/FVC Ratio: 95 %
RV % pred: 226 %
RV: 5.56 L
TLC % pred: 124 %
TLC: 8.77 L

## 2019-02-24 MED ORDER — TRELEGY ELLIPTA 100-62.5-25 MCG/INH IN AEPB: 1.0000 | INHALATION_SPRAY | Freq: Every day | RESPIRATORY_TRACT | 0 refills | Status: DC

## 2019-02-24 NOTE — Assessment & Plan Note (Signed)
Quit smoking 1990  - 11/14/2018  After extensive coaching inhaler device,  effectiveness =    90% with elipta so changed to Trelegy  - 11/14/2018 trained on flutter valve use  - PFT's  02/24/2019  FEV1 0.97 (29 % ) ratio 0.38  p 2 % improvement from saba p 0 prior to study with DLCO  9.89 (38%) corrects to 1.84 (45%)  for alv volume and FV curve classic curvature   - alpha one  Antitrypsin level 02/24/2019    Group D in terms of symptom/risk and laba/lama/ICS  therefore appropriate rx at this point >>>  trelegy daily, feels better vs prior sym/spiriva

## 2019-02-24 NOTE — Assessment & Plan Note (Addendum)
0 2 dep 24/7 @ 3lpm baseline, up to 5 lpm with activity as of 11/14/2018   Advised: Make sure you check your oxygen saturations at highest level of activity to be sure it stays over 90% and adjust upward to maintain this level if needed but remember to turn it back to previous settings when you stop (to conserve your supply).   Also Pt informed of the seriousness of COVID 19 infection as a direct risk to his health  and safey and to those of his loved ones and should continue to wear facemask in public and minimize exposure to public locations but especially avoid any area or activity where non-close contacts are not observing distancing or wearing an appropriate face mask.   I had an extended discussion with the patient reviewing all relevant studies completed to date and  lasting 15 to 20 minutes of a 25 minute visit    Each maintenance medication was reviewed in detail including most importantly the difference between maintenance and prns and under what circumstances the prns are to be triggered using an action plan format that is not reflected in the computer generated alphabetically organized AVS.     Please see AVS for specific instructions unique to this visit that I personally wrote and verbalized to the the pt in detail and then reviewed with pt  by my nurse highlighting any  changes in therapy recommended at today's visit to their plan of care.

## 2019-02-24 NOTE — Patient Instructions (Addendum)
Please remember to go to the lab department   for your tests - we will call you with the results when they are available.     No change in respiratory medications  Adjust 02 to keep your saturations above 90%   We will refer you to sleep medicine here to establish    Please schedule a follow up visit in 6  months but call sooner if needed

## 2019-02-24 NOTE — Progress Notes (Signed)
Full PFT performed today. °

## 2019-02-24 NOTE — Progress Notes (Signed)
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 Pulmonary Consult  Dyspnea:  Indolent onset progressive doe abruptly worse winter of 2019  s pattern of aecopd :  Baseline= Walks up to 5lpm pulsed  Does not check sat level At rest no sob. Cough: esp in am very thick white.  Sleep: bipap at hs /3lpm SABA use: alb 3 x daily Bad cns effects of prednisone in past   rec Plan A = Automatic =  Trelegy one click each am  - take 2 good drags  Plan B = Backup for breathing Only use your albuterol inhaler as a rescue medication   Plan B = backup for cough mucinex or mucinex dm up to 1200 mg every 12 hours and use the flutter valve as possible  Please schedule a follow up visit in 3 months but call sooner if needed for pfts  - add needs alpha one screening on return     02/24/2019  f/u ov/ re: gold iv copd/ 02 dep  Chief Complaint  Patient presents with  . Follow-up    PFT done. Breathing has improved since the last visit. He is using his albuterol inhaler rarely.   Dyspnea:  Walking about 2 blocks s stopping x 4 lpm POC - not checking sats Cough: hardly any / some scratchy throat but tolerable  Sleeping: bipap and 3.5 lpm per [place]  SABA use: rare  albuterol  02: about 4lpm poc    No obvious day to day or daytime variability or assoc excess/ purulent sputum or mucus plugs or hemoptysis or cp or chest tightness, subjective wheeze or overt sinus or hb symptoms.   Sleeping  without nocturnal  or early am exacerbation  of respiratory  c/o's or need for noct saba. Also denies any obvious  fluctuation of symptoms with weather or environmental changes or other aggravating or alleviating factors except as outlined above   No unusual exposure hx or h/o childhood pna/ asthma or knowledge of premature birth.  Current Allergies, Complete Past Medical History, Past Surgical History, Family History, and Social History were reviewed in [employer] record.  ROS  The following are not active complaints unless bolded Hoarseness, sore throat, dysphagia, dental problems, itching, sneezing,  nasal congestion or discharge of excess mucus or purulent secretions, ear ache,   fever, chills, sweats, unintended wt loss or wt gain, classically pleuritic or exertional cp,  orthopnea pnd or arm/hand swelling  or leg swelling, presyncope, palpitations, abdominal pain, anorexia, nausea, vomiting, diarrhea  or change in bowel habits or change in bladder habits, change in stools or change in urine, dysuria, hematuria,  rash, arthralgias, visual complaints, headache, numbness, weakness or ataxia or problems with walking or coordination,  change in mood or  memory.        Current Meds  Medication Sig  . albuterol (VENTOLIN HFA) 108 (90 Base) MCG/ACT inhaler Inhale 2 puffs into the lungs every 6 (six) hours as needed for wheezing or shortness of breath.  Marland Kitchen  diltiazem (CARDIZEM) 120 MG tablet Take 120 mg by mouth daily.  . flecainide (TAMBOCOR) 100 MG tablet Take 100 mg by mouth 2 (two) times daily.  . hydrochlorothiazide (MICROZIDE) 12.5 MG capsule Take 12.5 mg by mouth daily.  . insulin aspart (NOVOLOG) 100 UNIT/ML injection Inject into the skin as directed.  Marland Kitchen levothyroxine (SYNTHROID) 88 MCG tablet Take 88 mcg by mouth daily before breakfast.  . OXYGEN 3lpm with exertion  . potassium chloride SA (K-DUR) 20 MEQ tablet Take 20 mEq by mouth daily.  Marland Kitchen Respiratory Therapy Supplies (FLUTTER) DEVI Use as directed  . temazepam (RESTORIL) 30 MG capsule Take 30 mg by mouth at bedtime as needed  for sleep.  Marland Kitchen torsemide (DEMADEX) 10 MG tablet Take 10 mg by mouth daily.  . TRELEGY ELLIPTA 100-62.5-25 MCG/INH AEPB USE 1 PUFF DAILY  . UNABLE TO FIND Med Name: BIPAP with 3lpm o2                           Objective:      amb thin wm and  BP (!) 110/58 (BP Location: Left Arm, Cuff Size: Normal)   Pulse 70   Temp 97.9 F (36.6 C) (Temporal)   Ht 5\' 10"  (1.778 m)   Wt 154 lb (69.9 kg)   SpO2 90% Comment: on RA  BMI 22.10 kg/m       Wt Readings from Last 3 Encounters:  02/24/19 154 lb (69.9 kg)  11/17/18 146 lb 9.6 oz (66.5 kg)  11/14/18 149 lb (67.6 kg)     HEENT : pt wearing mask not removed for exam due to covid -19 concerns.    NECK :  without JVD/Nodes/TM/ nl carotid upstrokes bilaterally   LUNGS: no acc muscle use,  Mod barrel  contour chest wall with bilateral  Distant bs s audible wheeze and  without cough on insp or exp maneuvers and mod  Hyperresonant  to  percussion bilaterally     CV:  RRR  no s3 or murmur or increase in P2, and no edema   ABD:  soft and nontender with pos mid insp Hoover's  in the supine position. No bruits or organomegaly appreciated, bowel sounds nl  MS:     ext warm without deformities, calf tenderness, cyanosis or clubbing No obvious joint restrictions   SKIN: warm and dry without lesions    NEURO:  alert, approp, nl sensorium with  no motor or cerebellar deficits apparent.       Labs ordered 02/24/2019  :   alpha one AT phenotype  / cbc with diff      Assessment

## 2019-03-01 ENCOUNTER — Other Ambulatory Visit: Payer: Self-pay

## 2019-03-01 ENCOUNTER — Encounter: Payer: Self-pay | Admitting: Physician Assistant

## 2019-03-01 ENCOUNTER — Ambulatory Visit (INDEPENDENT_AMBULATORY_CARE_PROVIDER_SITE_OTHER): Payer: Medicare Other | Admitting: Physician Assistant

## 2019-03-01 VITALS — BP 100/60 | HR 68 | Temp 98.0°F | Ht 70.0 in | Wt 155.2 lb

## 2019-03-01 DIAGNOSIS — R195 Other fecal abnormalities: Secondary | ICD-10-CM

## 2019-03-01 NOTE — Patient Instructions (Signed)
Follow up with Dr. Silverio Decamp or Nicoletta Ba, PA-C as needed.

## 2019-03-02 ENCOUNTER — Encounter: Payer: Self-pay | Admitting: Physician Assistant

## 2019-03-02 NOTE — Progress Notes (Signed)
Subjective:    Patient ID: Arthur [name], male    DOB: [birth year], 73 y.o.   MRN: [id number]  HPI Arthur [name] is a pleasant 73 year old white male, new to GI today and referred by Dr. Coletta Memos, for positive Cologuard. Patient has not had any prior GI evaluation, no prior colonoscopy. He relates family history negative for colon cancer. He denies any current GI symptoms, specifically no complaints of abdominal discomfort, changes in bowel habits melena or hematochezia.  Appetite has been good, weight has been stable. Recent labs reviewed from 02/24/2019 and CBC within normal limits. Patient has significant comorbidities including end-stage COPD, on chronic oxygen 3 L at nighttime and as needed during the day.  He also requires BiPAP at nighttime. 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 He is very concerned about his ability to complete a prep with his brittle diabetes.  He also has significant concerns about undergoing colonoscopy, and sedation.  He has questions about the accuracy of Cologuard and what it means. He states that he knows that if he was found to have a colon cancer, he would not have surgery, as he does not think that he would survive.  He knows that his life expectancy is limited with his severe COPD.   Review of Systems Pertinent positive and negative review of systems were noted in the above HPI section.  All other review of systems was otherwise negative.  Outpatient Encounter Medications as of 03/01/2019  Medication Sig  . albuterol (VENTOLIN HFA) 108 (90 Base) MCG/ACT inhaler Inhale 2 puffs into the lungs every 6 (six) hours as needed for wheezing or shortness of breath.  . diltiazem (CARDIZEM) 120 MG tablet Take 120 mg by mouth daily.  . flecainide (TAMBOCOR) 100 MG tablet Take 100 mg by mouth 2 (two) times daily.  . hydrochlorothiazide (MICROZIDE) 12.5 MG capsule Take 12.5 mg by mouth daily.  . insulin aspart  (NOVOLOG) 100 UNIT/ML injection Inject into the skin as directed.  Marland Kitchen levothyroxine (SYNTHROID) 88 MCG tablet Take 88 mcg by mouth daily before breakfast.  . OXYGEN 3lpm with exertion  . potassium chloride SA (K-DUR) 20 MEQ tablet Take 20 mEq by mouth daily.  Marland Kitchen Respiratory Therapy Supplies (FLUTTER) DEVI Use as directed  . temazepam (RESTORIL) 30 MG capsule Take 30 mg by mouth at bedtime as needed for sleep.  Marland Kitchen torsemide (DEMADEX) 10 MG tablet Take 10 mg by mouth daily.  . TRELEGY ELLIPTA 100-62.5-25 MCG/INH AEPB USE 1 PUFF DAILY  . UNABLE TO FIND Med Name: BIPAP with 3lpm o2   No facility-administered encounter medications on file as of 03/01/2019.    Allergies  Allergen Reactions  . Mefloquine Hcl Nausea And Vomiting   Patient Active Problem List   Diagnosis Date Noted  . Chronic respiratory failure with hypoxia ([place]) 11/15/2018  . COPD GOLD IV  11/14/2018   Social History   Socioeconomic History  . Marital status: Single    Spouse name: Not on file  . Number of children: Not on file  . Years of education: Not on file  . Highest education level: Not on file  Occupational History  . Not on file  Social Needs  . Financial resource strain: Not on file  . Food insecurity    Worry: Not on file    Inability: Not on file  . Transportation needs    Medical: Not on file    Non-medical: Not on file  Tobacco Use  .  Smoking status: Former Smoker    Packs/day: 1.50    Years: 25.00    Pack years: 32.50    Quit date: 04/20/1988    Years since quitting: 30.8  . Smokeless tobacco: Never Used  Substance and Sexual Activity  . Alcohol use: Yes  . Drug use: Not on file  . Sexual activity: Not on file  Lifestyle  . Physical activity    Days per week: Not on file    Minutes per session: Not on file  . Stress: Not on file  Relationships  . Social Herbalist on phone: Not on file    Gets together: Not on file    Attends religious service: Not on file    Active member of  club or organization: Not on file    Attends meetings of clubs or organizations: Not on file    Relationship status: Not on file  . Intimate partner violence    Fear of current or ex partner: Not on file    Emotionally abused: Not on file    Physically abused: Not on file    Forced sexual activity: Not on file  Other Topics Concern  . Not on file  Social History Narrative  . Not on file    Arthur [name] family history includes Cancer in his father; Stroke in his mother.      Objective:    Vitals:   03/01/19 1527  BP: 100/60  Pulse: 68  Temp: 98 F (36.7 C)    Physical Exam Well-developed older white male, somewhat chronically ill-appearing, on nasal O2 -in no acute distress.  Pleasant , Weight, 155 BMI 22.2  HEENT; nontraumatic normocephalic, EOMI, PE RR LA, sclera anicteric.  Extremities; no clubbing cyanosis or edema skin warm and dry Neuro/Psych; alert and oriented x4, grossly nonfocal mood and affect appropriate       Assessment & Plan:   #40 73 year old white male with positive Cologuard, asymptomatic and with normal hemoglobin. Cannot rule out occult colon neoplasm, or polyps  #2 end-stage COPD-on chronic oxygen, requires 3 L at nighttime and BiPAP #3 insulin-dependent diabetes mellitus-brittle with insulin pump #4 hypertension  Plan; Long discussion with the patient today regarding Colonoscopy including details regarding prep, and discussion regarding increased risk for complications with sedation given his severe COPD. We discussed the limitations of Cologuard, which is felt to be about 92% sensitive for colon neoplasm, and about 42% sensitive for detection of precancerous polyps.  There is a false positive rate of 10 to 12%. Patient is not concerned about polyps, and states that he knows that he would not have surgery if he were ever found to have a colon cancer and would opt for hospice. He is comfortable not proceeding with Colonoscopy, and I think this is  very reasonable given his severe COPD. Would not do future screenings for occult blood or colon cancer. Patient advised we are happy to see him back at any point, should he develop any GI symptoms or other GI issues.  Amy Genia Harold PA-C 03/02/2019   Cc: Bernerd Limbo, MD

## 2019-03-03 LAB — ALPHA-1 ANTITRYPSIN PHENOTYPE: A-1 Antitrypsin, Ser: 139 mg/dL (ref 83–199)

## 2019-03-06 NOTE — Progress Notes (Signed)
LMTCB

## 2019-03-07 ENCOUNTER — Telehealth: Payer: Self-pay | Admitting: Internal Medicine

## 2019-03-07 NOTE — Telephone Encounter (Signed)
Call returned to patient, confirmed DOB, requesting lab results:  Notes recorded by Tanda Rockers, MD on 03/03/2019 at 6:19 AM EST. Call patient : Study is neg for alpha one def.   Made aware of results per MW. Voiced understanding. Pt states he was supposed to be referred to a Sleep Doctor in the clinic but no one ever contacted him. Consult appt made for OSA, currently on Bi-pap. Voiced understanding.   Nothing further needed at this time.

## 2019-03-20 NOTE — Progress Notes (Signed)
Reviewed and agree with documentation and assessment and plan. K. Veena Felma Pfefferle , MD   

## 2019-03-22 ENCOUNTER — Other Ambulatory Visit: Payer: Self-pay | Admitting: Internal Medicine

## 2019-03-23 ENCOUNTER — Other Ambulatory Visit: Payer: Self-pay

## 2019-03-23 ENCOUNTER — Telehealth: Payer: Self-pay | Admitting: Pulmonary Disease

## 2019-03-23 ENCOUNTER — Encounter: Payer: Self-pay | Admitting: Pulmonary Disease

## 2019-03-23 ENCOUNTER — Ambulatory Visit (INDEPENDENT_AMBULATORY_CARE_PROVIDER_SITE_OTHER): Payer: Medicare Other | Admitting: Pulmonary Disease

## 2019-03-23 VITALS — BP 142/62 | HR 89 | Ht 70.0 in | Wt 155.6 lb

## 2019-03-23 DIAGNOSIS — G4733 Obstructive sleep apnea (adult) (pediatric): Secondary | ICD-10-CM

## 2019-03-23 DIAGNOSIS — J449 Chronic obstructive pulmonary disease, unspecified: Secondary | ICD-10-CM

## 2019-03-23 NOTE — Telephone Encounter (Signed)
[facility] [phone] to request sleep study from retired provider Dr. Zenia Resides.  LM on VM to call back with fax number. Medical release signed by Patient.  Will continue to follow up until medical release faxed to [facility].

## 2019-03-23 NOTE — Addendum Note (Signed)
Addended by: Elton Sin on: 03/23/2019 02:41 PM   Modules accepted: Orders

## 2019-03-23 NOTE — Telephone Encounter (Signed)
Called [phone] and LM requesting fax number to request sleep study. Will follow up

## 2019-03-23 NOTE — Patient Instructions (Signed)
History of obstructive sleep apnea Gold stage IV  Using BiPAP on a regular basis Tolerates BiPAP well  Continue BiPAP We will try and get a copy of your old study  We will send a prescription for BiPAP to your medical supply company to see if we can get it approved  I will see you back in the office in about 6 months

## 2019-03-23 NOTE — Progress Notes (Signed)
Arthur [name]    [id number]    Oct 28, 1945  Primary Care Physician:Bouska, Shanon Brow, MD  Referring Physician: Bernerd Limbo, [address] [place],  [place] [address]  Chief complaint:   Patient being seen for history of obstructive sleep apnea Gold stage IV  HPI:  Has been using BiPAP for many years Relocated from [employer] up with Dr. Franchot Heidelberg is dated  Patient is on 3 L of oxygen continuous flow  Is concerned about being in any environment that puts him at risk Does not want to repeat a sleep study at present  Obstructive sleep apnea was diagnosed about 2014 Very compliant with BiPAP use  Reformed smoker-quit in 1990 Retired ICU nurse  Comorbidities include emphysema, diabetes, hypertension, atrial fibrillation  Uses temazepam nightly to help him sleep  Outpatient Encounter Medications as of 03/23/2019  Medication Sig  . albuterol (VENTOLIN HFA) 108 (90 Base) MCG/ACT inhaler Inhale 2 puffs into the lungs every 6 (six) hours as needed for wheezing or shortness of breath.  . ALLOPURINOL PO Take 300 mg by mouth daily.  Marland Kitchen diltiazem (CARDIZEM) 120 MG tablet Take 120 mg by mouth daily.  . flecainide (TAMBOCOR) 100 MG tablet Take 100 mg by mouth 2 (two) times daily.  . hydrochlorothiazide (MICROZIDE) 12.5 MG capsule Take 12.5 mg by mouth daily.  . insulin aspart (NOVOLOG) 100 UNIT/ML injection Inject into the skin as directed.  Marland Kitchen levothyroxine (SYNTHROID) 88 MCG tablet Take 88 mcg by mouth daily before breakfast.  . OXYGEN 3lpm with exertion  . potassium chloride SA (K-DUR) 20 MEQ tablet Take 20 mEq by mouth daily.  Marland Kitchen Respiratory Therapy Supplies (FLUTTER) DEVI Use as directed  . temazepam (RESTORIL) 30 MG capsule Take 30 mg by mouth at bedtime as needed for sleep.  Marland Kitchen torsemide (DEMADEX) 10 MG tablet Take 10 mg by mouth daily.  . TRELEGY ELLIPTA 100-62.5-25 MCG/INH AEPB INHALE 1 PUFF DAILY  . UNABLE TO FIND Med Name: BIPAP with  3lpm o2   No facility-administered encounter medications on file as of 03/23/2019.     Allergies as of 03/23/2019 - Review Complete 03/23/2019  Allergen Reaction Noted  . Mefloquine hcl Nausea And Vomiting 07/03/2010    Past Medical History:  Diagnosis Date  . Atrial fibrillation ([place])   . Chronic kidney disease   . COPD (chronic obstructive pulmonary disease) ([place])   . Hypertension   . Type 2 diabetes mellitus ([place])   . Vitiligo     Past Surgical History:  Procedure Laterality Date  . MELANOMA EXCISION      Family History  Problem Relation Age of Onset  . Stroke Mother   . Cancer Father     Social History   Socioeconomic History  . Marital status: Single    Spouse name: Not on file  . Number of children: Not on file  . Years of education: Not on file  . Highest education level: Not on file  Occupational History  . Not on file  Social Needs  . Financial resource strain: Not on file  . Food insecurity    Worry: Not on file    Inability: Not on file  . Transportation needs    Medical: Not on file    Non-medical: Not on file  Tobacco Use  . Smoking status: Former Smoker    Packs/day: 1.50    Years: 25.00    Pack years: 37.50    Quit  date: 04/20/1988    Years since quitting: 30.9  . Smokeless tobacco: Never Used  Substance and Sexual Activity  . Alcohol use: Yes  . Drug use: Not on file  . Sexual activity: Not on file  Lifestyle  . Physical activity    Days per week: Not on file    Minutes per session: Not on file  . Stress: Not on file  Relationships  . Social Herbalist on phone: Not on file    Gets together: Not on file    Attends religious service: Not on file    Active member of club or organization: Not on file    Attends meetings of clubs or organizations: Not on file    Relationship status: Not on file  . Intimate partner violence    Fear of current or ex partner: Not on file    Emotionally abused: Not on file    Physically  abused: Not on file    Forced sexual activity: Not on file  Other Topics Concern  . Not on file  Social History Narrative  . Not on file    Review of Systems  Constitutional: Negative.   Respiratory: Positive for apnea and shortness of breath. Negative for cough.   Cardiovascular: Negative for chest pain.  Psychiatric/Behavioral: Positive for sleep disturbance.    Vitals:   03/23/19 1108  BP: (!) 142/62  Pulse: 89  SpO2: 96%     Physical Exam  Constitutional: He appears well-developed and well-nourished.  HENT:  Head: Normocephalic and atraumatic.  Eyes: Pupils are equal, round, and reactive to light. Right eye exhibits no discharge.  Neck: Normal range of motion. Neck supple. No tracheal deviation present. No thyromegaly present.  Cardiovascular: Normal rate and regular rhythm.  Pulmonary/Chest: Effort normal. No respiratory distress. He has no wheezes. He has no rales. He exhibits no tenderness.  Poor air movement bilaterally  Musculoskeletal: Normal range of motion.  Neurological: He is alert.  Skin: Skin is warm and dry.  Psychiatric: He has a normal mood and affect.   Data Reviewed: Previous study-sleep study not available  PFT reveals an FEV1 of 0.95 L, diffusing capacity of 38% Download reveals excellent compliance AHI of 1.1  Assessment:  Obstructive sleep apnea -On BiPAP therapy 10/5 -Tolerates BiPAP well with excellent compliance -Significant improvement in symptoms  Stage IV COPD  Chronic respiratory failure chronic home oxygen  Plan/Recommendations: Patient requires a new BiPAP machine  He is afraid of contracting Covid with evaluation in the sleep lab He is eligible for new machine  With his history of good compliance and good improvement in symptoms  We will try to get a copy of his old study and send in a prescription for new BiPAP-same settings as previous as it does demonstrate good compliance and improvement in symptoms  The likelihood  of coming to harm without continuing to use his BiPAP is very high, he feels his current machine is at a point where it may become dysfunctional  I will see him back in the office in 3 months  Sherrilyn Rist MD [facility] 03/23/2019, 11:41 AM  CC: Bernerd Limbo, MD

## 2019-03-24 NOTE — Telephone Encounter (Signed)
[facility] [phone], spoke with [facility].  Fax number [phone] given. Medical release faxed requesting sleep study and office notes. Nothing further at this time.

## 2019-04-19 ENCOUNTER — Telehealth: Payer: Self-pay | Admitting: Pulmonary Disease

## 2019-04-19 NOTE — Telephone Encounter (Signed)
Notes from 03/23/19 office visit printed and faxed to [phone] to the attention of [place] at [place]. Nothing further needed at this time.

## 2019-04-26 ENCOUNTER — Telehealth: Payer: Self-pay | Admitting: Pulmonary Disease

## 2019-04-26 DIAGNOSIS — G4733 Obstructive sleep apnea (adult) (pediatric): Secondary | ICD-10-CM

## 2019-04-26 NOTE — Telephone Encounter (Signed)
Split-night study report reviewed-study date November 14, 2012  Diagnosed with mild obstructive sleep apnea with AHI of 9.4 Was intolerant of CPAP Titrated to BiPAP of 10/6  Study has been 72 years old  Patient requires a home sleep study  If positive for obstructive sleep apnea Will be treated with BiPAP-previous settings   Order a home sleep study on the patient  Inform the patient that his study was reviewed but because of the timeline, has been many years since his last study-we need to confirm that significant obstructive sleep apnea is still present

## 2019-04-26 NOTE — Telephone Encounter (Signed)
Called and spoke with pt letting him know the information stated by AO in regards to the split-night study. Stated to pt that he would require to have a HST if he was fine with it and he said it was okay for [place] to order it.  Order has been placed for the HST. Nothing further needed.

## 2019-05-13 ENCOUNTER — Ambulatory Visit: Payer: Medicare Other | Attending: Internal Medicine

## 2019-05-13 DIAGNOSIS — Z23 Encounter for immunization: Secondary | ICD-10-CM

## 2019-06-03 ENCOUNTER — Ambulatory Visit: Payer: Medicare Other | Attending: Internal Medicine

## 2019-06-03 DIAGNOSIS — Z23 Encounter for immunization: Secondary | ICD-10-CM

## 2019-06-03 NOTE — Progress Notes (Signed)
   Covid-19 Vaccination Clinic  Name:  Srujan Valera    MRN: [id number] DOB: 04-05-1946  06/03/2019  Mr. Dionne was observed post Covid-19 immunization for 15 minutes without incidence. He was provided with Vaccine Information Sheet and instruction to access the V-Safe system.   Mr. Karnitz was instructed to call 911 with any severe reactions post vaccine: Marland Kitchen Difficulty breathing  . Swelling of your face and throat  . A fast heartbeat  . A bad rash all over your body  . Dizziness and weakness    Immunizations Administered    Name Date Dose VIS Date Route   Pfizer COVID-19 Vaccine 06/03/2019 12:44 PM 0.3 mL 03/31/2019 Intramuscular   Manufacturer: [place]   Lot: [id number]   [place]: [id number]

## 2019-07-13 ENCOUNTER — Encounter: Payer: Self-pay | Admitting: Pulmonary Disease

## 2019-07-13 ENCOUNTER — Ambulatory Visit (INDEPENDENT_AMBULATORY_CARE_PROVIDER_SITE_OTHER): Payer: Medicare Other | Admitting: Pulmonary Disease

## 2019-07-13 ENCOUNTER — Other Ambulatory Visit: Payer: Self-pay

## 2019-07-13 VITALS — BP 132/70 | HR 78 | Temp 97.4°F | Ht 70.0 in | Wt 155.2 lb

## 2019-07-13 DIAGNOSIS — G4733 Obstructive sleep apnea (adult) (pediatric): Secondary | ICD-10-CM

## 2019-07-13 DIAGNOSIS — Z9989 Dependence on other enabling machines and devices: Secondary | ICD-10-CM

## 2019-07-13 DIAGNOSIS — J449 Chronic obstructive pulmonary disease, unspecified: Secondary | ICD-10-CM | POA: Diagnosis not present

## 2019-07-13 DIAGNOSIS — J9611 Chronic respiratory failure with hypoxia: Secondary | ICD-10-CM | POA: Diagnosis not present

## 2019-07-13 NOTE — Progress Notes (Signed)
Bassel Rudisill    [id number]    May 08, 1945  Primary Care Physician:Bouska, Shanon Brow, MD  Referring Physician: Bernerd Limbo, [address] [place],  [place] [address]  Chief complaint:   Patient being seen for history of obstructive sleep apnea Gold stage IV Has been holding relatively steady  HPI:  Feels well generally On BiPAP and tolerating it well Currently on BiPAP 10/5 Has a history of obstructive sleep apnea  Follows up with Dr. 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 albuterol (VENTOLIN HFA) 108 (90 Base) MCG/ACT inhaler Inhale 2 puffs into the lungs every 6 (six) hours as needed for wheezing or shortness of breath.  . ALLOPURINOL PO Take 300 mg by mouth daily.  Marland Kitchen diltiazem (CARDIZEM) 120 MG tablet Take 120 mg by mouth daily.  . flecainide (TAMBOCOR) 100 MG tablet Take 100 mg by mouth 2 (two) times daily.  . hydrochlorothiazide (MICROZIDE) 12.5 MG capsule Take 12.5 mg by mouth daily.  . insulin aspart (NOVOLOG) 100 UNIT/ML injection Inject into the skin as directed.  Marland Kitchen levothyroxine (SYNTHROID) 88 MCG tablet Take 88 mcg by mouth daily before breakfast.  . OXYGEN 3lpm with exertion  . potassium chloride SA (K-DUR) 20 MEQ tablet Take 20 mEq by mouth daily.  Marland Kitchen Respiratory Therapy Supplies (FLUTTER) DEVI Use as directed  . temazepam (RESTORIL) 30 MG capsule Take 30 mg by mouth at bedtime as needed for sleep.  Marland Kitchen  torsemide (DEMADEX) 10 MG tablet Take 10 mg by mouth daily.  . TRELEGY ELLIPTA 100-62.5-25 MCG/INH AEPB INHALE 1 PUFF DAILY  . UNABLE TO FIND Med Name: BIPAP with 3lpm o2   No facility-administered encounter medications on file as of 07/13/2019.    Allergies as of 07/13/2019 - Review Complete 03/23/2019  Allergen Reaction Noted  . Mefloquine hcl Nausea And Vomiting 07/03/2010    Past Medical History:  Diagnosis Date  . Atrial fibrillation ([place])   . Chronic kidney disease   . COPD (chronic obstructive pulmonary disease) ([place])   . Hypertension   . Type 2 diabetes mellitus ([place])   . Vitiligo     Past Surgical History:  Procedure Laterality Date  . MELANOMA EXCISION      Family History  Problem Relation Age of Onset  . Stroke Mother   . Cancer Father     Social History   Socioeconomic History  . Marital status: Single    Spouse name: Not on file  . Number of children: Not on file  . Years of education: Not on file  . Highest education level: Not on file  Occupational History  . Not on file  Tobacco Use  . Smoking status: Former Smoker    Packs/day: 1.50    Years: 25.00    Pack years: 75.50    Quit date: 04/20/1988    Years since quitting: 31.2  . Smokeless tobacco: Never Used  Substance and Sexual Activity  .  Alcohol use: Yes  . Drug use: Not on file  . Sexual activity: Not on file  Other Topics Concern  . Not on file  Social History Narrative  . Not on file   Social Determinants of Health   Financial Resource Strain:   . Difficulty of Paying Living Expenses:   Food Insecurity:   . Worried About Charity fundraiser in the Last Year:   . Arboriculturist in the Last Year:   Transportation Needs:   . Film/video editor (Medical):   Marland Kitchen Lack of Transportation (Non-Medical):   Physical Activity:   . Days of Exercise per Week:   . Minutes of Exercise per Session:   Stress:   . Feeling of Stress :   Social Connections:   . Frequency of Communication  with Friends and Family:   . Frequency of Social Gatherings with Friends and Family:   . Attends Religious Services:   . Active Member of Clubs or Organizations:   . Attends Archivist Meetings:   Marland Kitchen Marital Status:   Intimate Partner Violence:   . Fear of Current or Ex-Partner:   . Emotionally Abused:   Marland Kitchen Physically Abused:   . Sexually Abused:     Review of Systems  Constitutional: Negative.   Respiratory: Positive for apnea and shortness of breath. Negative for cough.   Cardiovascular: Negative for chest pain.  Psychiatric/Behavioral: Positive for sleep disturbance.    Vitals:   07/13/19 1106  BP: 132/70  Pulse: 78  Temp: (!) 97.4 F (36.3 C)  SpO2: 93%     Physical Exam  Constitutional: He appears well-developed and well-nourished.  HENT:  Head: Normocephalic and atraumatic.  Eyes: Pupils are equal, round, and reactive to light. Right eye exhibits no discharge.  Neck: No tracheal deviation present. No thyromegaly present.  Cardiovascular: Normal rate and regular rhythm.  Pulmonary/Chest: Effort normal. No respiratory distress. He has no wheezes. He has no rales. He exhibits no tenderness.  Poor air movement bilaterally  Musculoskeletal:     Cervical back: Normal range of motion and neck supple.   Data Reviewed: Previous study-sleep study not available  PFT reveals an FEV1 of 0.95 L, diffusing capacity of 38% Download reveals excellent compliance AHI of 1.1  BiPAP compliance 100% compliance--2/23 -3/24 AHI of 1 Set at 10/5   Assessment:  Obstructive sleep apnea -On BiPAP therapy 10/5 -Tolerates BiPAP well with excellent compliance -Significant improvement in symptoms  Stage IV COPD -Trelegy continues to help  Chronic respiratory failure chronic home oxygen  Plan/Recommendations:  Continue BiPAP for obstructive sleep apnea-good compliance  Continue oxygen supplementation  Continue Trelegy  Call with significant concerns  I will see  him again in 3 months  He wants to go back to rehab as he is lost a lot of muscle strength since not been able to exercise regularly He will give [place] a call to let us know when we can send a note to rehab facility to resume rehab  Sherrilyn Rist MD [facility] 07/13/2019, 11:52 AM  CC: Bernerd Limbo, MD

## 2019-07-13 NOTE — Patient Instructions (Signed)
COPD Chronic respiratory failure  Continue oxygen supplementation Continue Trelegy Graded exercises as tolerated  I will follow-up with you in about 3 months Call with significant concerns

## 2019-08-09 ENCOUNTER — Telehealth: Payer: Self-pay | Admitting: Pulmonary Disease

## 2019-08-09 MED ORDER — FLUTTER DEVI: 1.0000 | 0 refills | Status: AC | PRN

## 2019-08-09 NOTE — Telephone Encounter (Signed)
Flutter valves cost [id number] so he can certainly purchase on his own but insurance only pays for one   [facility] with me to change providers

## 2019-08-09 NOTE — Telephone Encounter (Signed)
Okay with me 

## 2019-08-09 NOTE — Telephone Encounter (Signed)
Spoke with Arthur [name], he would like to switch from Dr. Melvyn Novas to Dr. Ander Slade. Please advise.   Can we give Arthur [name] a (green) flutter valve? He would like the order sent to [facility] [phone]. He would like to have 2 or would like to have it to where he doesn't have to call every time he needs one.

## 2019-08-09 NOTE — Telephone Encounter (Signed)
AO, are you ok with him switching to you for his sleep and pulmonary needs? You have already seen him for his bipap. Please advise. Thanks!

## 2019-08-09 NOTE — Telephone Encounter (Signed)
Spoke with patient. He is aware that AO is ok with taking over his care. I also made him aware that MW stated that it was ok for him to have a RX for a flutter valve to be sent over to [facility].   [place] and spoke with Nira Conn. She provided me with their fax number [phone].   Will go ahead and print the order. Will have to stamp Dr. Gustavus Bryant name since he is in [place] this week. Leaving encounter open for follow up.

## 2019-08-10 NOTE — Telephone Encounter (Signed)
Form was faxed to [facility] this morning.   Will close this encounter.

## 2019-08-24 ENCOUNTER — Ambulatory Visit: Payer: Medicare Other | Admitting: Internal Medicine

## 2019-08-30 ENCOUNTER — Other Ambulatory Visit: Payer: Self-pay | Admitting: Internal Medicine

## 2019-10-24 ENCOUNTER — Ambulatory Visit (INDEPENDENT_AMBULATORY_CARE_PROVIDER_SITE_OTHER): Payer: Medicare Other | Admitting: Pulmonary Disease

## 2019-10-24 ENCOUNTER — Other Ambulatory Visit: Payer: Self-pay

## 2019-10-24 ENCOUNTER — Encounter: Payer: Self-pay | Admitting: Pulmonary Disease

## 2019-10-24 VITALS — BP 142/72 | HR 102 | Temp 97.9°F | Ht 70.0 in | Wt 150.0 lb

## 2019-10-24 DIAGNOSIS — J9611 Chronic respiratory failure with hypoxia: Secondary | ICD-10-CM | POA: Diagnosis not present

## 2019-10-24 DIAGNOSIS — J449 Chronic obstructive pulmonary disease, unspecified: Secondary | ICD-10-CM | POA: Diagnosis not present

## 2019-10-24 NOTE — Patient Instructions (Signed)
Severe COPD Respiratory failure  Continue oxygen supplementation  Graded exercise as he can tolerate  Continue inhaler use  Continue BiPAP use   We'll see you in 3 months

## 2019-10-24 NOTE — Progress Notes (Signed)
Arthur [name]    [id number]    03-08-1946  Primary Care Physician:Bouska, Shanon Brow, MD  Referring Physician: Bernerd Limbo, [place],   [id number]  Chief complaint:   Patient being seen for history of obstructive sleep apnea Gold stage IV Has been holding relatively steady  HPI:  Feels well generally On BiPAP and tolerating it well Currently on BiPAP 10/5 Has a history of obstructive sleep apnea  Stage IV COPD Limited with activities of daily living Routine activities are challenging  We did talk extensively about pulmonary rehab and regular exercises  Doing relatively well  Patient is on 3 L of oxygen continuous flow Titrate his oxygen higher to maintain saturations greater than 90 whenever he is active  Is concerned about being in any environment that puts him at risk Does not want to repeat a sleep study at present  Obstructive sleep apnea was diagnosed about 2014 Very compliant with BiPAP use  Reformed smoker-quit in 1990 Retired ICU nurse  Comorbidities include emphysema, diabetes, hypertension, atrial fibrillation  Uses temazepam nightly to help him sleep  Outpatient Encounter Medications as of 10/24/2019  Medication Sig  . albuterol (VENTOLIN HFA) 108 (90 Base) MCG/ACT inhaler Inhale 2 puffs into the lungs every 6 (six) hours as needed for wheezing or shortness of breath.  . ALLOPURINOL PO Take 300 mg by mouth daily.  Marland Kitchen diltiazem (CARDIZEM) 120 MG tablet Take 120 mg by mouth daily.  . flecainide (TAMBOCOR) 100 MG tablet Take 100 mg by mouth 2 (two) times daily.  . hydrochlorothiazide (MICROZIDE) 12.5 MG capsule Take 12.5 mg by mouth daily.  . hydroxychloroquine (PLAQUENIL) 200 MG tablet   . insulin aspart (NOVOLOG) 100 UNIT/ML injection Inject into the skin as directed.  Marland Kitchen levothyroxine (SYNTHROID) 88 MCG tablet Take 88 mcg by mouth daily before breakfast.  . OXYGEN 3lpm with exertion  . potassium chloride  SA (K-DUR) 20 MEQ tablet Take 20 mEq by mouth daily.  Marland Kitchen Respiratory Therapy Supplies (FLUTTER) DEVI Use as directed  . Respiratory Therapy Supplies (FLUTTER) DEVI 1 Device by Does not apply route as needed.  . temazepam (RESTORIL) 30 MG capsule Take 30 mg by mouth at bedtime as needed for sleep.  Marland Kitchen torsemide (DEMADEX) 10 MG tablet Take 10 mg by mouth daily.  . TRELEGY ELLIPTA 100-62.5-25 MCG/INH AEPB TAKE 1 PUFF BY MOUTH EVERY DAY  . UNABLE TO FIND Med Name: BIPAP with 3lpm o2   No facility-administered encounter medications on file as of 10/24/2019.    Allergies as of 10/24/2019 - Review Complete 10/24/2019  Allergen Reaction Noted  . Mefloquine hcl Nausea And Vomiting 07/03/2010    Past Medical History:  Diagnosis Date  . Atrial fibrillation ([place])   . Chronic kidney disease   . COPD (chronic obstructive pulmonary disease) ([place])   . Hypertension   . Type 2 diabetes mellitus ([place])   . Vitiligo     Past Surgical History:  Procedure Laterality Date  . MELANOMA EXCISION      Family History  Problem Relation Age of Onset  . Stroke Mother   . Cancer Father     Social History   Socioeconomic History  . Marital status: Single    Spouse name: Not on file  . Number of children: Not on file  . Years of education: Not on file  . Highest education level: Not on file  Occupational History  . Not on file  Tobacco Use  . Smoking status: Former Smoker    Packs/day: 1.50    Years: 25.00    Pack years: 62.50    Quit date: 04/20/1988    Years since quitting: 31.5  . Smokeless tobacco: Never Used  Vaping Use  . Vaping Use: Never used  Substance and Sexual Activity  . Alcohol use: Yes  . Drug use: Not on file  . Sexual activity: Not on file  Other Topics Concern  . Not on file  Social History Narrative  . Not on file   Social Determinants of Health   Financial Resource Strain:   . Difficulty of Paying Living Expenses:   Food Insecurity:   . Worried About Sales executive in the Last Year:   . Arboriculturist in the Last Year:   Transportation Needs:   . Film/video editor (Medical):   Marland Kitchen Lack of Transportation (Non-Medical):   Physical Activity:   . Days of Exercise per Week:   . Minutes of Exercise per Session:   Stress:   . Feeling of Stress :   Social Connections:   . Frequency of Communication with Friends and Family:   . Frequency of Social Gatherings with Friends and Family:   . Attends Religious Services:   . Active Member of Clubs or Organizations:   . Attends Archivist Meetings:   Marland Kitchen Marital Status:   Intimate Partner Violence:   . Fear of Current or Ex-Partner:   . Emotionally Abused:   Marland Kitchen Physically Abused:   . Sexually Abused:     Review of Systems  Constitutional: Negative.   Respiratory: Positive for apnea and shortness of breath. Negative for cough.   Cardiovascular: Negative for chest pain.  Psychiatric/Behavioral: Positive for sleep disturbance.    Vitals:   10/24/19 1628  BP: (!) 142/72  Pulse: (!) 102  Temp: 97.9 F (36.6 C)  SpO2: 93%     Physical Exam Constitutional:      Appearance: He is well-developed.  HENT:     Head: Normocephalic and atraumatic.  Eyes:     General:        Right eye: No discharge.     Pupils: Pupils are equal, round, and reactive to light.  Neck:     Thyroid: No thyromegaly.     Trachea: No tracheal deviation.  Cardiovascular:     Rate and Rhythm: Normal rate and regular rhythm.  Pulmonary:     Effort: Pulmonary effort is normal. No respiratory distress.     Breath sounds: No stridor. No wheezing, rhonchi or rales.  Musculoskeletal:     Cervical back: Normal range of motion and neck supple.  Psychiatric:        Mood and Affect: Mood normal.    Data Reviewed: Previous study-sleep study not available  PFT reveals an FEV1 of 0.95 L, diffusing capacity of 38% Download reveals excellent compliance AHI of 1.1  BiPAP compliance 100% compliance--2/23 -3/24 AHI  of 1 Set at 10/5  Most recent compliance from 09/20/2019 to 10/19/2019 reveals AHI of 0.7, 100% compliance   Assessment:  Obstructive sleep apnea -On BiPAP therapy 10/5 -Continue with BiPAP at same setting  Stage IV COPD -Continue Trelegy  Chronic respiratory failure chronic home oxygen -Continue oxygen supplementation  Importance of graded exercises and regularly challenging himself was discussed  Plan/Recommendations:  Continue BiPAP for obstructive sleep apnea-good compliance  Continue oxygen supplementation  Continue Trelegy  Call with significant concerns  Follow-up  in 3 months  Graded exercises as tolerated Consider going back to rehab- He did try it one time since the last time he was here and could not tolerate it  Handicap parking form filled  Sherrilyn Rist MD  Pulmonary and Critical Care 10/24/2019, 4:36 PM  CC: Bernerd Limbo, MD

## 2020-01-09 ENCOUNTER — Ambulatory Visit: Payer: Medicare Other | Attending: Internal Medicine

## 2020-01-09 DIAGNOSIS — Z23 Encounter for immunization: Secondary | ICD-10-CM

## 2020-01-09 NOTE — Progress Notes (Signed)
   Covid-19 Vaccination Clinic  Name:  Arthur [name]    MRN: [id number] DOB: [birth year]  01/09/2020  [name] was observed post Covid-19 immunization for 15 minutes without incident. He was provided with Vaccine Information Sheet and instruction to access the V-Safe system.   [name] was instructed to call 911 with any severe reactions post vaccine: Marland Kitchen Difficulty breathing  . Swelling of face and throat  . A fast heartbeat  . A bad rash all over body  . Dizziness and weakness

## 2020-01-30 ENCOUNTER — Encounter: Payer: Self-pay | Admitting: Pulmonary Disease

## 2020-01-30 ENCOUNTER — Other Ambulatory Visit: Payer: Self-pay

## 2020-01-30 ENCOUNTER — Ambulatory Visit (INDEPENDENT_AMBULATORY_CARE_PROVIDER_SITE_OTHER): Payer: Medicare Other | Admitting: Pulmonary Disease

## 2020-01-30 VITALS — BP 124/74 | HR 78 | Temp 97.2°F | Ht 70.0 in | Wt 154.0 lb

## 2020-01-30 DIAGNOSIS — J449 Chronic obstructive pulmonary disease, unspecified: Secondary | ICD-10-CM | POA: Diagnosis not present

## 2020-01-30 DIAGNOSIS — G4733 Obstructive sleep apnea (adult) (pediatric): Secondary | ICD-10-CM

## 2020-01-30 DIAGNOSIS — Z9989 Dependence on other enabling machines and devices: Secondary | ICD-10-CM | POA: Diagnosis not present

## 2020-01-30 NOTE — Patient Instructions (Signed)
Continue graded exercises as we discussed  Continue using her BiPAP on a regular basis  Call with significant concerns  It is important that you start regular graded exercises to help keep your muscle mass  I will see you back in 3 months

## 2020-01-30 NOTE — Progress Notes (Signed)
Arthur [name]    [id number]    01/16/1946  Primary Care Physician:Bouska, Shanon Brow, MD  Referring Physician: Bernerd Limbo, [place],  [place] [id number]  Chief complaint:   Patient with stage IV COPD Obstructive sleep apnea   HPI: Remains compliant with BiPAP use Compliant with Trelegy  Has not been staying very active Shortness of breath with activity  Generally feels okay but does get short of breath with most activities  We did talk extensively about pulmonary rehab and regular exercises -Not interested in driving anywhere to get exercises so we spent a good amount of time talking about exercises he may be able to do at home  Doing relatively well  Patient is on 3 L of oxygen continuous flow Titrate his oxygen higher to maintain saturations greater than 90 whenever he is active  Obstructive sleep apnea was diagnosed about 2014 Very compliant with BiPAP use  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 albuterol (VENTOLIN HFA) 108 (90 Base) MCG/ACT inhaler Inhale 2 puffs into the lungs every 6 (six) hours as needed for wheezing or shortness of breath.  . ALLOPURINOL PO Take 300 mg by mouth daily.  Marland Kitchen diltiazem (CARDIZEM) 120 MG tablet Take 120 mg by mouth daily.  . flecainide (TAMBOCOR) 100 MG tablet Take 100 mg by mouth 2 (two) times daily.  . hydrochlorothiazide (MICROZIDE) 12.5 MG capsule Take 12.5 mg by mouth daily.  . hydroxychloroquine (PLAQUENIL) 200 MG tablet   . insulin aspart (NOVOLOG) 100 UNIT/ML injection Inject into the skin as directed.  Marland Kitchen levothyroxine (SYNTHROID) 88 MCG tablet Take 88 mcg by mouth daily before breakfast.  . OXYGEN 3lpm with exertion  . potassium chloride SA (K-DUR) 20 MEQ tablet Take 20 mEq by mouth  daily.  Marland Kitchen Respiratory Therapy Supplies (FLUTTER) DEVI Use as directed  . Respiratory Therapy Supplies (FLUTTER) DEVI 1 Device by Does not apply route as needed.  . temazepam (RESTORIL) 30 MG capsule Take 30 mg by mouth at bedtime as needed for sleep.  Marland Kitchen torsemide (DEMADEX) 10 MG tablet Take 10 mg by mouth daily.  . TRELEGY ELLIPTA 100-62.5-25 MCG/INH AEPB TAKE 1 PUFF BY MOUTH EVERY DAY  . UNABLE TO FIND Med Name: BIPAP with 3lpm o2   No facility-administered encounter medications on file as of 01/30/2020.    Allergies as of 01/30/2020 - Review Complete 01/30/2020  Allergen Reaction Noted  . Mefloquine hcl Nausea And Vomiting 07/03/2010    Past Medical History:  Diagnosis Date  . Atrial fibrillation ([place])   . Chronic kidney disease   . COPD (chronic obstructive pulmonary disease) ([place])   . Hypertension   . Type 2 diabetes mellitus ([place])   . Vitiligo     Past Surgical History:  Procedure Laterality Date  . MELANOMA EXCISION      Family History  Problem Relation Age of Onset  . Stroke Mother   . Cancer Father     Social History   Socioeconomic History  . Marital status: Single    Spouse name: Not on file  . Number of children: Not on file  . Years of education: Not on file  . Highest education level: Not on file  Occupational History  . Not on file  Tobacco Use  . Smoking status:  Former Smoker    Packs/day: 1.50    Years: 25.00    Pack years: 37.50    Quit date: 04/20/1988    Years since quitting: 31.8  . Smokeless tobacco: Never Used  Vaping Use  . Vaping Use: Never used  Substance and Sexual Activity  . Alcohol use: Yes  . Drug use: Not on file  . Sexual activity: Not on file  Other Topics Concern  . Not on file  Social History Narrative  . Not on file   Social Determinants of Health   Financial Resource Strain:   . Difficulty of Paying Living Expenses: Not on file  Food Insecurity:   . Worried About Charity fundraiser in the Last Year: Not on file   . Ran Out of Food in the Last Year: Not on file  Transportation Needs:   . Lack of Transportation (Medical): Not on file  . Lack of Transportation (Non-Medical): Not on file  Physical Activity:   . Days of Exercise per Week: Not on file  . Minutes of Exercise per Session: Not on file  Stress:   . Feeling of Stress : Not on file  Social Connections:   . Frequency of Communication with Friends and Family: Not on file  . Frequency of Social Gatherings with Friends and Family: Not on file  . Attends Religious Services: Not on file  . Active Member of Clubs or Organizations: Not on file  . Attends Archivist Meetings: Not on file  . Marital Status: Not on file  Intimate Partner Violence:   . Fear of Current or Ex-Partner: Not on file  . Emotionally Abused: Not on file  . Physically Abused: Not on file  . Sexually Abused: Not on file    Review of Systems  Constitutional: Negative.   Respiratory: Positive for apnea and shortness of breath. Negative for cough.   Cardiovascular: Negative for chest pain.  Psychiatric/Behavioral: Positive for sleep disturbance.    Vitals:   01/30/20 1159  BP: 124/74  Pulse: 78  Temp: (!) 97.2 F (36.2 C)  SpO2: 92%     Physical Exam Constitutional:      Appearance: He is well-developed.  HENT:     Head: Normocephalic and atraumatic.  Eyes:     General:        Right eye: No discharge.     Pupils: Pupils are equal, round, and reactive to light.  Neck:     Thyroid: No thyromegaly.     Trachea: No tracheal deviation.  Cardiovascular:     Rate and Rhythm: Normal rate and regular rhythm.  Pulmonary:     Effort: No respiratory distress.     Breath sounds: No stridor. No wheezing, rhonchi or rales.     Comments: Decreased air movement bilaterally Musculoskeletal:     Cervical back: No rigidity or tenderness.  Psychiatric:        Mood and Affect: Mood normal.    Data Reviewed: Previous study-sleep study not available  PFT  reviewed  BiPAP compliance-12/30/2019 to 01/28/2020 100% compliance with residual AHI of 0.9  BiPAP compliance 100% compliance--2/23 -3/24 AHI of 1 Set at 10/5  Recent compliance from 09/20/2019 to 10/19/2019 reveals AHI of 0.7, 100% compliance   Assessment:  Obstructive sleep apnea -Continue BiPAP therapy  Stage IV COPD -Continue Trelegy  Chronic respiratory failure -Continue oxygen supplementation  Importance of graded exercises and regularly challenging himself was discussed  -Extensive discussions about exercises he may be able  to do at home  Plan/Recommendations:  Continue BiPAP therapy  Continue oxygen supplementation  Continue Trelegy  Call with significant concerns  We will follow-up in 3 months  Sherrilyn Rist MD [facility] 01/30/2020, 12:16 PM  CC: Bernerd Limbo, MD

## 2020-03-25 ENCOUNTER — Other Ambulatory Visit: Payer: Self-pay | Admitting: Pulmonary Disease

## 2020-04-15 ENCOUNTER — Telehealth: Payer: Self-pay | Admitting: Pulmonary Disease

## 2020-04-15 DIAGNOSIS — R042 Hemoptysis: Secondary | ICD-10-CM

## 2020-04-15 NOTE — Telephone Encounter (Signed)
Primary Pulmonologist: AO Last office visit and with whom: 01/30/20 with AO What do we see them for (pulmonary problems): COPD and OSA Last OV assessment/plan: Continue graded exercises as we discussed  Continue using her BiPAP on a regular basis  Call with significant concerns  It is important that you start regular graded exercises to help keep your muscle mass  I will see you back in 3 months   Was appointment offered to patient (explain)?  Patient is aware that an appt will be needed, he wanted recommendations before scheduling appt.    Reason for call: Spoke with patient. He stated that he has been coughing with clear phlegm with blood tinged sputum for the past 3 days. He denied any SOB, wheezing or fevers. Also denied being around anyone has been sick recently. While on the phone, he stated that he last had a CT scan in [place] in 2018 that showed nodules. He has not had any CT scans since then. I advised him that he would need a OV with AO to discuss the need for a CT scan, he agreed.      Allergies  Allergen Reactions  . Mefloquine Hcl Nausea And Vomiting    Immunization History  Administered Date(s) Administered  . Influenza Inj Mdck Quad Pf 03/02/2012  . Influenza, High Dose Seasonal PF 01/18/2014, 01/11/2015, 05/08/2016, 03/15/2017, 01/04/2018, 01/12/2019  . Influenza, Quadrivalent, Recombinant, Inj, Pf 02/09/2013  . Influenza, Seasonal, Injecte, Preservative Fre 02/14/2010, 01/27/2011, 02/09/2013  . Influenza-Unspecified 02/14/2010, 01/17/2020  . PFIZER SARS-COV-2 Vaccination 05/13/2019, 06/03/2019, 01/09/2020  . Pneumococcal Conjugate-13 08/08/2013  . Pneumococcal Polysaccharide-23 03/04/2007, 02/04/2015   AO, please advise. Thanks.

## 2020-04-16 ENCOUNTER — Other Ambulatory Visit: Payer: Self-pay | Admitting: Pulmonary Disease

## 2020-04-16 MED ORDER — DOXYCYCLINE HYCLATE 100 MG PO TABS: 100.0000 mg | ORAL_TABLET | Freq: Two times a day (BID) | ORAL | 0 refills | Status: DC

## 2020-04-16 MED ORDER — PREDNISONE 10 MG PO TABS: 10.0000 mg | ORAL_TABLET | Freq: Two times a day (BID) | ORAL | 0 refills | Status: AC

## 2020-04-16 NOTE — Telephone Encounter (Signed)
Schedule him for office visit as soon as possible with a chest x-ray on the day of visit  May see myself or one of the APP's  Call him in a course of antibiotics and steroids

## 2020-04-16 NOTE — Telephone Encounter (Addendum)
Arthur [name], There are no openings for this patient to be seen this week.  Is there anyway you can see this patient this afternoon?  Please advise.  Thank you.

## 2020-04-16 NOTE — Telephone Encounter (Signed)
Pt was scheduled for an appt by Kateri Plummer when pt called office back. Pt is scheduled to see [facility] 04/23/20. I have made notation that pt will need cxr prior to being seen. Nothing further needed.

## 2020-04-16 NOTE — Telephone Encounter (Signed)
Okay to lift slot.  Please have patient come into the office now order stat chest x-ray.  Please explained to the patient we will see him as soon as we are able to work him in.  The reasoning of having the patient come in early is because potentially based off a chest x-ray imaging he still may need to go to the emergency room.  Patient needs to be aware that this may still be a recommendation even after in person physical exam today.  Elisha Headland, FNP

## 2020-04-16 NOTE — Telephone Encounter (Signed)
Clarified with Dr. Wynona Neat that patient can be seen on Wednesday January 5th.  TP has openings.  He will need a stat cxr prior to his visit.  He will need to see emergency care if he continues to cough up blood prior to his visit.  Left VM for patient for him to return call to office regarding medications sent to [facility] and scheduling an appointment to see our NP on Wednesday January 5th.  Will await return call.

## 2020-04-16 NOTE — Progress Notes (Signed)
Prescription for doxycycline and prednisone called in 

## 2020-04-22 ENCOUNTER — Telehealth: Payer: Self-pay | Admitting: Primary Care

## 2020-04-22 NOTE — Telephone Encounter (Signed)
Spoke with patient. He wanted to know if he could change his visit tomorrow to a televisit. I explained to him that AO wanted him to have a CXR before OV, therefore he would need to come into the office. Was able to get him rescheduled for 01/10 at 1130 with Beth. He verbalized understanding.   Nothing further needed at time of call.

## 2020-04-23 ENCOUNTER — Ambulatory Visit: Payer: Medicare Other | Admitting: Primary Care

## 2020-04-29 ENCOUNTER — Ambulatory Visit: Payer: Medicare Other | Admitting: Primary Care

## 2020-05-01 NOTE — Progress Notes (Signed)
_0  ID: Arthur [name], male    DOB: May 27, 1945, 75 y.o.   MRN: [id number]  Chief Complaint  Patient presents with  . Follow-up    Small Pink tint to sputum occass. Usually 2-3 wks., worse sob    Referring provider: Bernerd Limbo, MD  HPI: 75 year old male, former smoker quit 1990 (37.5-pack-year history).  Medical history significant for COPD Gold 4, chronic respiratory failure with hypoxia OSA.  Patient of Dr. Ander Slade, last seen on 01/30/20. Maintained on Bipap, oxygen supplement and Trelegy Ellipta. Recommended fu in 3 months.   05/02/2020 Patient called our office on 04/15/2020 with reports of hemoptysis and shortness of breath x3 days. States that he had a CT scan and outside hospital in 2018 that showed pulmonary nodules.  Dr. Ander Slade recommending stat chest x-ray.  He was unable to come to apt that was scheduled on 04/23/20.   Shortness of breath has worsened over the last 3 months. He is coughing up pink tinge sputum on average 3 times a week. One time he had a speck of frank red blood, but all other times its been tinged. He also has some associated pleuretic chest pain which he rates 2/10 and has since resolved. He did not take prednisone or doxycycline course that was prescribed. States that prednisone makes him hyperactive. He states that he really like Trelegy Ellipta inhaler. He has been using his flutter valve every day. He gets out of breath walking short distances. He is on 4L POC. He was placed on continuous oxygen after walking back to exam room and O2 was 98% on 5L. He wears BIPAP with 3.5L oxygen. Patient reports that he had CT chest on 12/05/16 that showed numberous nodules, 1 had doubled in size but all remained < 1cm. He had a repeat CT chest with contrast in 02/2017 that showed all nodules were <1cm. Patient states that he does not want to undergo invasive diagnostic procedures or treatment if lung nodule has grown in size.    Airview download 04/01/20-04/30/20 Usage  days- 28/30 days; 90% > 4 hours Average usage 9 hours 10 mins Pressure IPAP 10cm h20; EPAP 5cm h20  Airleaks 31L/min (95%) AHI 0.5  Allergies  Allergen Reactions  . Mefloquine Hcl Nausea And Vomiting    Immunization History  Administered Date(s) Administered  . Influenza Inj Mdck Quad Pf 03/02/2012  . Influenza, High Dose Seasonal PF 01/18/2014, 01/11/2015, 05/08/2016, 03/15/2017, 01/04/2018, 01/12/2019  . Influenza, Quadrivalent, Recombinant, Inj, Pf 02/09/2013  . Influenza, Seasonal, Injecte, Preservative Fre 02/14/2010, 01/27/2011, 02/09/2013  . Influenza-Unspecified 02/14/2010, 01/17/2020  . PFIZER(Purple Top)SARS-COV-2 Vaccination 05/13/2019, 06/03/2019, 01/09/2020  . Pneumococcal Conjugate-13 08/08/2013  . Pneumococcal Polysaccharide-23 03/04/2007, 02/04/2015    Past Medical History:  Diagnosis Date  . Atrial fibrillation ([place])   . Chronic kidney disease   . COPD (chronic obstructive pulmonary disease) ([place])   . Hypertension   . Type 2 diabetes mellitus ([place])   . Vitiligo     Tobacco History: Social History   Tobacco Use  Smoking Status Former Smoker  . Packs/day: 1.50  . Years: 25.00  . Pack years: 37.50  . Quit date: 04/20/1988  . Years since quitting: 32.0  Smokeless Tobacco Never Used   Counseling given: Not Answered   Outpatient Medications Prior to Visit  Medication Sig Dispense Refill  . albuterol (VENTOLIN HFA) 108 (90 Base) MCG/ACT inhaler Inhale 2 puffs into the lungs every 6 (six) hours as needed for wheezing or shortness of breath.    . ALLOPURINOL  PO Take 300 mg by mouth daily.    Marland Kitchen diltiazem (CARDIZEM) 120 MG tablet Take 120 mg by mouth daily.    . flecainide (TAMBOCOR) 100 MG tablet Take 100 mg by mouth 2 (two) times daily.    . hydrochlorothiazide (MICROZIDE) 12.5 MG capsule Take 12.5 mg by mouth daily.    . hydroxychloroquine (PLAQUENIL) 200 MG tablet     . insulin aspart (NOVOLOG) 100 UNIT/ML injection Inject into the skin as directed.     Marland Kitchen levothyroxine (SYNTHROID) 88 MCG tablet Take 88 mcg by mouth daily before breakfast.    . OXYGEN 3lpm with exertion    . potassium chloride SA (K-DUR) 20 MEQ tablet Take 20 mEq by mouth daily.    Marland Kitchen Respiratory Therapy Supplies (FLUTTER) DEVI Use as directed 1 each 0  . temazepam (RESTORIL) 30 MG capsule Take 30 mg by mouth at bedtime as needed for sleep.    Marland Kitchen torsemide (DEMADEX) 10 MG tablet Take 10 mg by mouth daily.    . TRELEGY ELLIPTA 100-62.5-25 MCG/INH AEPB INHALE 1 PUFF BY MOUTH EVERY DAY 60 each 5  . UNABLE TO FIND Med Name: BIPAP with 3lpm o2    . doxycycline (VIBRA-TABS) 100 MG tablet Take 1 tablet (100 mg total) by mouth 2 (two) times daily. 14 tablet 0  . Respiratory Therapy Supplies (FLUTTER) DEVI 1 Device by Does not apply route as needed. 2 each 0   No facility-administered medications prior to visit.    Review of Systems  Review of Systems  Constitutional: Negative.   Respiratory: Positive for cough and shortness of breath. Negative for chest tightness and wheezing.   Cardiovascular: Negative.     Physical Exam  BP 110/60 (BP Location: Left Arm, Cuff Size: Normal)   Pulse 92   Temp (!) 97.2 F (36.2 C) (Temporal)   Ht 5' 10" (1.778 m)   Wt 146 lb (66.2 kg)   SpO2 98%   BMI 20.95 kg/m  Physical Exam Constitutional:      General: He is not in acute distress.    Appearance: Normal appearance.     Comments: Thin male; Chronically ill appearing  Cardiovascular:     Rate and Rhythm: Normal rate and regular rhythm.  Pulmonary:     Effort: Pulmonary effort is normal.     Comments: Diminished; no overt rhonchi or wheezing Neurological:     Mental Status: He is alert.  Psychiatric:        Mood and Affect: Mood normal.        Behavior: Behavior normal.        Thought Content: Thought content normal.        Judgment: Judgment normal.      Lab Results:  CBC    Component Value Date/Time   WBC 9.2 05/02/2020 1256   RBC 5.18 05/02/2020 1256   HGB 16.1  05/02/2020 1256   HCT 49.4 05/02/2020 1256   PLT 268.0 05/02/2020 1256   MCV 95.4 05/02/2020 1256   MCH 31.5 06/25/2010 0525   MCHC 32.5 05/02/2020 1256   RDW 15.9 (H) 05/02/2020 1256   LYMPHSABS 0.7 05/02/2020 1256   MONOABS 0.9 05/02/2020 1256   EOSABS 0.2 05/02/2020 1256   BASOSABS 0.1 05/02/2020 1256    BMET    Component Value Date/Time   NA 138 05/02/2020 1256   K 4.1 05/02/2020 1256   CL 96 05/02/2020 1256   CO2 34 (H) 05/02/2020 1256   GLUCOSE 215 (H) 05/02/2020 1256  BUN 32 (H) 05/02/2020 1256   CREATININE 1.05 05/02/2020 1256   CALCIUM 9.3 05/02/2020 1256   GFRNONAA >60 06/25/2010 1349   GFRAA  06/25/2010 1349    >60        The eGFR has been calculated using the MDRD equation. This calculation has not been validated in all clinical situations. eGFR's persistently <60 mL/min signify possible Chronic Kidney Disease.    BNP No results found for: BNP  ProBNP No results found for: PROBNP  Imaging: DG Chest 2 View  Result Date: 05/02/2020 CLINICAL DATA:  Hemoptysis EXAM: CHEST - 2 VIEW COMPARISON:  06/25/2010 chest radiograph. FINDINGS: Normal heart size. New irregular thickening of the right paratracheal stripe. No pneumothorax. Hyperinflated lungs. No pleural effusion. Right upper lobe 2.4 cm pulmonary nodule. Patchy hazy opacities in the bilateral parahilar and upper left lungs. IMPRESSION: 1. Right upper lobe 2.4 cm pulmonary nodule. New irregular thickening of the right paratracheal stripe, cannot exclude mediastinal adenopathy. Chest CT with IV contrast recommended for further evaluation. 2. Patchy hazy opacities in the bilateral parahilar and upper left lungs, differential includes alveolar hemorrhage or atypical infection. Electronically Signed   By: Ilona Sorrel M.D.   On: 05/02/2020 13:02   CT Angio Chest W/Cm &/Or Wo Cm  Result Date: 05/06/2020 CLINICAL DATA:  Positive D-dimer and shortness of breath, mass on recent chest x-ray with suggestion of  central lymphadenopathy. EXAM: CT ANGIOGRAPHY CHEST WITH CONTRAST TECHNIQUE: Multidetector CT imaging of the chest was performed using the standard protocol during bolus administration of intravenous contrast. Multiplanar CT image reconstructions and MIPs were obtained to evaluate the vascular anatomy. CONTRAST:  148m OMNIPAQUE IOHEXOL 350 MG/ML SOLN COMPARISON:  05/02/2020 FINDINGS: Cardiovascular: Thoracic aorta demonstrates atherosclerotic calcifications. No aneurysmal dilatation or dissection is noted. Scattered coronary calcifications are identified. The pulmonary artery is well visualized without evidence of pulmonary emboli. No cardiac enlargement is noted. Mediastinum/Nodes: Significant mediastinal adenopathy is identified. There are multiple right paratracheal lymph nodes identified. The largest conglomeration of adenopathy measures 5.0 x 3.8 cm. Prevascular node is noted on image number 28 of series 4 measuring 13 mm in short axis. Precarinal adenopathy measures 3.7 x 3.6 cm and appears contiguous with the right paratracheal adenopathy mass. AP window lymph node on image number 40 of series 4 measures 13 mm in short axis. Right hilar adenopathy measuring 3.6 x 3.6 cm is noted on image number 52. On the same image there is evidence of left hilar lymph node measuring almost 13 mm in short axis. Subcarinal adenopathy is noted which measures at least 15 mm in short axis. Is difficult to discern the esophagus from the adjacent adenopathy which may be result in some under evaluation of size of subcarinal adenopathy. Fullness in the right thoracic inlet is noted portion of which is consistent with adenopathy measuring at least 15 mm in short axis on image number 5 of series 4. The timing of the contrast bolus somewhat limits the evaluation of cervical adenopathy at this time. Lungs/Pleura: Lungs are well aerated and demonstrate diffuse emphysematous changes consistent with the given clinical history. In the  right upper lobe there is a multilobulated mass lesion which measures 2.8 cm in greatest transverse diameter and 2.1 cm in greatest AP diameter. It measures approximately 1.4 cm in craniocaudad projection. Scattered smaller subcentimeter nodules are noted throughout the right lung. The largest of these is noted at the junction of the minor and major fissures best seen on image number 87 of series 6 measuring  8.6 mm. Smaller nodules are seen on images 51, 61, 63, 96, 99, 110, 118 and 125 of series 6. In the left lung, multiple nodules are noted. The largest of these measures 12.4 mm best seen on image number 33 of series 6. Scattered smaller nodules are noted on images 17, 21, 27, 40, 49, 60 , 67 and 81 of series 6. Additionally, there is a pleural based nodule which measures 2.2 x 1.0 cm best seen on image number 60 of series 6. Upper Abdomen: Visualized upper abdomen is within normal limits. No definitive mass is seen. No discrete sizable adenopathy is identified at this time. Musculoskeletal: No chest wall abnormality. No acute or significant osseous findings. Review of the MIP images confirms the above findings. IMPRESSION: Dominant mass lesion in the right upper lobe with multiple parenchymal nodules bilaterally and significant mediastinal and hilar adenopathy bilaterally. 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 No evidence of pulmonary emboli. Aortic Atherosclerosis (ICD10-I70.0) and Emphysema (ICD10-J43.9). Critical Value/emergent results were called by telephone at the time of interpretation on 05/06/2020 at 4:51 pm to [facility], NP , who verbally acknowledged these results. She states she is had conversations with the patient with regards to this diagnosis. Further workup can be determined as clinically indicated but could include PET-CT imaging  and tissue diagnosis Electronically Signed   By: Inez Catalina M.D.   On: 05/06/2020 16:59     Assessment & Plan:   Hemoptysis - Former smoker with new hemoptysis and associated pleuritic chest pain  - Patient reports that he previously had CT chest in 2018 at outside facility that showed numerous lung nodules <1cm. These are not available for review, we will request records if able - Patient states that he does not want to undergo invasive diagnostic procedures or treatment if lung nodule has grown in size.  - CXR today showed right upper lobe 2.4 cm pulmonary nodule; patchy hazy opacities in the bilateral parahilar and upper left lungs, differential includes alveolar hemorrhage or atypical infection. - Needs CTA d/t elevated d-dimer - Advised patient he can go ahead and take Doxycycline that was prescribed for him  - Will discuss above with primary pulmonologist Dr. Ander Slade   COPD GOLD IV  - Shortness of breath has progressively worsened over the last 3 months - Start Trelegy 200 one puff daily in the morning  OSA treated with BiPAP - Patient is 90% compliant with BIPAP - Pressure IPAP 10/ EPAP 5; Residual AHI 0.5 - No changes today  Chronic respiratory failure with hypoxia ([place]) - Needs best fit for continuous oxygen tanks with Ellwood Handler, NP 05/07/2020

## 2020-05-02 ENCOUNTER — Ambulatory Visit (INDEPENDENT_AMBULATORY_CARE_PROVIDER_SITE_OTHER): Payer: Medicare Other | Admitting: Primary Care

## 2020-05-02 ENCOUNTER — Ambulatory Visit (INDEPENDENT_AMBULATORY_CARE_PROVIDER_SITE_OTHER): Payer: Medicare Other

## 2020-05-02 ENCOUNTER — Encounter: Payer: Self-pay | Admitting: Primary Care

## 2020-05-02 ENCOUNTER — Other Ambulatory Visit: Payer: Self-pay

## 2020-05-02 VITALS — BP 110/60 | HR 92 | Temp 97.2°F | Ht 70.0 in | Wt 146.0 lb

## 2020-05-02 DIAGNOSIS — G4733 Obstructive sleep apnea (adult) (pediatric): Secondary | ICD-10-CM | POA: Diagnosis not present

## 2020-05-02 DIAGNOSIS — R042 Hemoptysis: Secondary | ICD-10-CM

## 2020-05-02 DIAGNOSIS — J449 Chronic obstructive pulmonary disease, unspecified: Secondary | ICD-10-CM | POA: Diagnosis not present

## 2020-05-02 DIAGNOSIS — J9611 Chronic respiratory failure with hypoxia: Secondary | ICD-10-CM

## 2020-05-02 LAB — CBC WITH DIFFERENTIAL/PLATELET
Basophils Absolute: 0.1 10*3/uL (ref 0.0–0.1)
Basophils Relative: 0.9 % (ref 0.0–3.0)
Eosinophils Absolute: 0.2 10*3/uL (ref 0.0–0.7)
Eosinophils Relative: 1.7 % (ref 0.0–5.0)
HCT: 49.4 % (ref 39.0–52.0)
Hemoglobin: 16.1 g/dL (ref 13.0–17.0)
Lymphocytes Relative: 7.2 % — ABNORMAL LOW (ref 12.0–46.0)
Lymphs Abs: 0.7 10*3/uL (ref 0.7–4.0)
MCHC: 32.5 g/dL (ref 30.0–36.0)
MCV: 95.4 fl (ref 78.0–100.0)
Monocytes Absolute: 0.9 10*3/uL (ref 0.1–1.0)
Monocytes Relative: 10.1 % (ref 3.0–12.0)
Neutro Abs: 7.4 10*3/uL (ref 1.4–7.7)
Neutrophils Relative %: 80.1 % — ABNORMAL HIGH (ref 43.0–77.0)
Platelets: 268 10*3/uL (ref 150.0–400.0)
RBC: 5.18 Mil/uL (ref 4.22–5.81)
RDW: 15.9 % — ABNORMAL HIGH (ref 11.5–15.5)
WBC: 9.2 10*3/uL (ref 4.0–10.5)

## 2020-05-02 LAB — BASIC METABOLIC PANEL
BUN: 32 mg/dL — ABNORMAL HIGH (ref 6–23)
CO2: 34 mEq/L — ABNORMAL HIGH (ref 19–32)
Calcium: 9.3 mg/dL (ref 8.4–10.5)
Chloride: 96 mEq/L (ref 96–112)
Creatinine, Ser: 1.05 mg/dL (ref 0.40–1.50)
GFR: 71.11 mL/min (ref >60.00)
Glucose, Bld: 215 mg/dL — ABNORMAL HIGH (ref 70–99)
Potassium: 4.1 mEq/L (ref 3.5–5.1)
Sodium: 138 mEq/L (ref 135–145)

## 2020-05-02 LAB — D-DIMER, QUANTITATIVE: D-Dimer, Quant: 1.33 mcg/mL FEU — ABNORMAL HIGH (ref <0.50)

## 2020-05-02 MED ORDER — TRELEGY ELLIPTA 200-62.5-25 MCG/INH IN AEPB: 1.0000 | INHALATION_SPRAY | Freq: Every day | RESPIRATORY_TRACT | 0 refills | Status: DC

## 2020-05-02 NOTE — Patient Instructions (Addendum)
Possible causes of hemoptysis could be pneumonia, pulmonary embolism, lung mass or bronchiectasis. CXR today will show [place] if you have an active infection or pneumonia. We will get a CT scan of lungs to make sure you did not have a pulmonary embolism or large pulmonary nodules.   Recommendations: Start Trelegy 200- take one puff daily in the morning Hold off on taking prednisone or antibiotic  We will call you with CXR results by tomorrow - this is to make sure you do not have a large pneumonia We will also order CT scan of your lungs   Orders: Labs today Best fit for continuous oxygen tanks with [facility]   Follow-up: 1 month with Dr. Ander Slade or his first available

## 2020-05-03 ENCOUNTER — Telehealth: Payer: Self-pay | Admitting: Primary Care

## 2020-05-03 ENCOUNTER — Telehealth: Payer: Self-pay | Admitting: Pulmonary Disease

## 2020-05-03 ENCOUNTER — Ambulatory Visit (HOSPITAL_COMMUNITY): Payer: Medicare Other

## 2020-05-03 DIAGNOSIS — J9611 Chronic respiratory failure with hypoxia: Secondary | ICD-10-CM

## 2020-05-03 DIAGNOSIS — R0782 Intercostal pain: Secondary | ICD-10-CM

## 2020-05-03 NOTE — Telephone Encounter (Signed)
Called and spoke with the pt

## 2020-05-03 NOTE — Telephone Encounter (Signed)
Arthur [name] has scheduled it

## 2020-05-03 NOTE — Telephone Encounter (Signed)
Spoke with Estill Bamberg  She states needing order sent to them for continuous tanks  Based on last ov AVS will send order

## 2020-05-03 NOTE — Telephone Encounter (Signed)
Needs stat CTA r/o PE, new pulmonary nodule. Patient is aware I have ordered.

## 2020-05-06 ENCOUNTER — Ambulatory Visit (HOSPITAL_COMMUNITY)
Admission: RE | Admit: 2020-05-06 | Discharge: 2020-05-06 | Disposition: A | Discharge status: Home / Self Care | Payer: Medicare Other | Source: Ambulatory Visit | Attending: Primary Care | Admitting: Primary Care

## 2020-05-06 ENCOUNTER — Other Ambulatory Visit: Payer: Self-pay

## 2020-05-06 DIAGNOSIS — R0782 Intercostal pain: Secondary | ICD-10-CM

## 2020-05-06 MED ORDER — IOHEXOL 350 MG/ML SOLN
100.0000 mL | Freq: Once | INTRAVENOUS | Status: AC | PRN
  Administered 2020-05-06: 100 mL via INTRAVENOUS

## 2020-05-07 ENCOUNTER — Encounter: Payer: Self-pay | Admitting: Primary Care

## 2020-05-07 DIAGNOSIS — R042 Hemoptysis: Secondary | ICD-10-CM | POA: Insufficient documentation

## 2020-05-07 DIAGNOSIS — G4733 Obstructive sleep apnea (adult) (pediatric): Secondary | ICD-10-CM | POA: Insufficient documentation

## 2020-05-07 NOTE — Assessment & Plan Note (Signed)
-   Needs best fit for continuous oxygen tanks with [facility]

## 2020-05-07 NOTE — Assessment & Plan Note (Signed)
-   Shortness of breath has progressively worsened over the last 3 months - Start Trelegy 200 one puff daily in the morning

## 2020-05-07 NOTE — Assessment & Plan Note (Addendum)
-   Former smoker with new hemoptysis and associated pleuritic chest pain  - Patient reports that he previously had CT chest in 2018 at outside facility that showed numerous lung nodules <1cm. These are not available for review, we will request records if able - Patient states that he does not want to undergo invasive diagnostic procedures or treatment if lung nodule has grown in size.  - CXR today showed right upper lobe 2.4 cm pulmonary nodule; patchy hazy opacities in the bilateral parahilar and upper left lungs, differential includes alveolar hemorrhage or atypical infection. - Needs CTA d/t elevated d-dimer - Advised patient he can go ahead and take Doxycycline that was prescribed for him  - Will discuss above with primary pulmonologist Dr. Ander Slade

## 2020-05-07 NOTE — Assessment & Plan Note (Addendum)
-   Patient is 90% compliant with BIPAP - Pressure IPAP 10/ EPAP 5; Residual AHI 0.5 - No changes today

## 2020-05-08 ENCOUNTER — Telehealth: Payer: Self-pay | Admitting: Pulmonary Disease

## 2020-05-08 NOTE — Telephone Encounter (Signed)
Thank you for getting this

## 2020-05-08 NOTE — Telephone Encounter (Signed)
Would like CT results. Please call after 1130 when he will be available

## 2020-05-08 NOTE — Telephone Encounter (Signed)
I spoke with patient and notified him of results of right upper lobe lung mass. Patient is open to diagnostic procedures, unsure how aggressive he would wants to treat this. Likely not a great candidate for bronchoscopy d/t severe COPD. May be able to get tissue bx with IR. Referring to [facility].   Raquel Sarna can we see if we can get his CT imaging from 2018, may need to call patient and ask where this was done

## 2020-05-08 NOTE — Telephone Encounter (Signed)
CT was done 03/08/2017 at [facility]. The written report is able to be seen in Care Everywhere:  Interface, Rad Results In - 03/08/2017 2:39 PM EST  CT SCAN OF THE CHEST WITH IV CONTRAST:   COMPARISON: 12/07/2016   TECHNIQUE: Utilizing automatic exposure control, helically  acquired images were obtained from the lung apices through the  adrenal glands following IV contrast administration.   CONTRAST: 80 mL iohexol (OMNIPAQUE) 350 mg iodine/mL injection  100 mL, no reaction.   FINDINGS:   Right thyroid lobe low-density nodule with calcification is  stable. No axillary lymphadenopathy. Normal caliber thoracic  aorta and main pulmonary artery. No mediastinal lymphadenopathy.  Heart size is normal. No pericardial or pleural effusion. No  pneumothorax. Panlobular emphysema. 5 mm left upper lobe nodule  on image 24 is new. Right apical nodule on image 23 measures 3 mm  and is stable. Right upper lobe nodule on image 55 is stable.  Fissural nodule on the right measures 8 mm, stable. Right middle  lobe nodules on images 80 and 81 are stable. Nodule adjacent to  the right horizontal fissure on image74 is stable. Fissural  nodule on the right on image 85 is stable. Right lower lobe  anterior basal segment nodule on image 89 is stable. Inferior  lingular pleural-based nodule on image 56 is stable. No acute  bony findings. Left-sided nephrolithiasis.No findings of urinary  obstruction in the upper abdomen.    IMPRESSION:   SEVERE EMPHYSEMA WITH NUMEROUS STABLE BILATERAL PULMONARY  NODULES. HOWEVER, THERE IS A NEW LEFT UPPER LOBE 5 MM NODULE.  RECOMMEND CONTINUED CT SURVEILLANCE.   LEFT-SIDED NEPHROLITHIASIS.   Dictated By: Manon Hilding Dziedzic MD 03/08/2017 2:34 PM  Electronically Signed by: Manon Hilding Dziedzic MD 03/08/2017 2:37 PM Specimen Collected: -- Last Resulted: --  Date: 03/08/17   Received From: [facility], please  advise if we need to get a disc of this CT.

## 2020-05-09 ENCOUNTER — Telehealth: Payer: Self-pay | Admitting: Primary Care

## 2020-05-09 ENCOUNTER — Other Ambulatory Visit: Payer: Self-pay | Admitting: *Deleted

## 2020-05-09 DIAGNOSIS — R918 Other nonspecific abnormal finding of lung field: Secondary | ICD-10-CM

## 2020-05-09 MED ORDER — TRELEGY ELLIPTA 200-62.5-25 MCG/INH IN AEPB: 1.0000 | INHALATION_SPRAY | Freq: Every day | RESPIRATORY_TRACT | 11 refills | Status: AC

## 2020-05-09 NOTE — Telephone Encounter (Signed)
Called and spoke with pt letting him know the info stated by [facility] that  He needs to contact either endocrinology or PCP about figuring out how to stop insulin pump prior to PET. Pt stated he has already gotten this taken care of. Nothing further needed.

## 2020-05-09 NOTE — Progress Notes (Signed)
The proposed treatment discussed in cancer conference is for discussion purpose only and is not a binding recommendation. The patient was not physically examined nor present for their treatment options. Therefore, final treatment plans cannot be decided.  ?

## 2020-05-09 NOTE — Telephone Encounter (Signed)
Patient was discussed at [facility] today, recommending PET prior to possible IR bx. Pulmonary nodules primary upper lobes, there is concern regarding adenopathy and need to rule out distant disease/involvement which would change treatment options for patient. Patient was notified and agreeing to imaging. Will touch base afterwards.

## 2020-05-09 NOTE — Addendum Note (Signed)
Addended by: Martyn Ehrich on: 05/09/2020 10:00 AM   Modules accepted: Orders

## 2020-05-09 NOTE — Telephone Encounter (Signed)
Raquel Sarna can you call Mr [place] and let him know that he will need to either contact his endocrinologist regarding this or PCP

## 2020-05-10 NOTE — Telephone Encounter (Signed)
Schedule follow-up visit visit  Order PET/CT-this will help identify active lesions to target.   Very high risk of pneumothorax with extensive emphysema with biopsy

## 2020-05-10 NOTE — Telephone Encounter (Signed)
Pt is scheduled for PET scan 05/21/20. Pt already has a f/u scheduled with AO so the results can be discussed then during that visit, if not before.

## 2020-05-14 ENCOUNTER — Telehealth: Payer: Self-pay | Admitting: Primary Care

## 2020-05-15 NOTE — Telephone Encounter (Signed)
This is what I told Arthur [name] as well, just wanted to check. Thank you

## 2020-05-15 NOTE — Telephone Encounter (Signed)
Called and spoke with pt and he was wondering why the PET scan is being done prior to having the biopsy.  He wanted to know if this is correct?   His DME gave him a hard time about his oxygen needs.He stated that they told him that he is using too much oxygen from his tanks.    He had some questions about this and would like to have Beth call him please.

## 2020-05-15 NOTE — Telephone Encounter (Signed)
Called [employer] and spoke with Tiffany stating the info from [place]. Tiffany stated that they called and discussed info with pt yesterday 1/25.  Per Tiffany with [employer], the protocol is that patients are required to use the home concentrator while they are at home as that is what it is for and the tanks are supposed to be used when patients go out.  She also stated that pt's concentrator can go up to 5L so patient will be able to have all the O2 he needs while using the concentrator.  Routing to [employer].

## 2020-05-15 NOTE — Telephone Encounter (Signed)
Attempted to call pt to reiterate the info from [place] but unable to reach. Left message for him to return call.

## 2020-05-15 NOTE — Telephone Encounter (Signed)
Spoke with patient. Explained why we are getting PET scan prior to [place], this was recommended by [place]. He also tells me that [employer] is giving him a hard time about using too many tanks. He is using tanks during the day while he is at home. He is only using concentrator at night time. He uses between 2.5-4L oxygen. He is unable to use POC d/t oxygen need. I explained that typically patients use their concentrator during the day while at home and use tanks when leaving the house.   Can we please call or send an email to [employer] to see what they recommend? He would prefer to use tanks as it is easier for him to get around at home. Is this possible?

## 2020-05-15 NOTE — Telephone Encounter (Signed)
Called and spoke with pt letting him know the info found out by [employer] and he verbalized understanding. Nothing further needed.

## 2020-05-21 ENCOUNTER — Other Ambulatory Visit: Payer: Self-pay

## 2020-05-21 ENCOUNTER — Ambulatory Visit (HOSPITAL_COMMUNITY)
Admission: RE | Admit: 2020-05-21 | Discharge: 2020-05-21 | Disposition: A | Discharge status: Home / Self Care | Payer: Medicare Other | Source: Ambulatory Visit | Attending: Primary Care | Admitting: Primary Care

## 2020-05-21 DIAGNOSIS — J439 Emphysema, unspecified: Secondary | ICD-10-CM | POA: Diagnosis not present

## 2020-05-21 DIAGNOSIS — R918 Other nonspecific abnormal finding of lung field: Secondary | ICD-10-CM | POA: Diagnosis not present

## 2020-05-21 DIAGNOSIS — C7972 Secondary malignant neoplasm of left adrenal gland: Secondary | ICD-10-CM | POA: Diagnosis not present

## 2020-05-21 DIAGNOSIS — C7951 Secondary malignant neoplasm of bone: Secondary | ICD-10-CM | POA: Diagnosis not present

## 2020-05-21 DIAGNOSIS — N200 Calculus of kidney: Secondary | ICD-10-CM | POA: Diagnosis not present

## 2020-05-21 DIAGNOSIS — I700 Atherosclerosis of aorta: Secondary | ICD-10-CM | POA: Diagnosis not present

## 2020-05-21 DIAGNOSIS — I2510 Atherosclerotic heart disease of native coronary artery without angina pectoris: Secondary | ICD-10-CM | POA: Insufficient documentation

## 2020-05-21 LAB — GLUCOSE, CAPILLARY: Glucose-Capillary: 244 mg/dL — ABNORMAL HIGH (ref 70–99)

## 2020-05-21 MED ORDER — FLUDEOXYGLUCOSE F - 18 (FDG) INJECTION
7.4000 | Freq: Once | INTRAVENOUS | Status: AC
  Administered 2020-05-21: 7.4 via INTRAVENOUS

## 2020-05-23 ENCOUNTER — Telehealth: Payer: Self-pay | Admitting: Primary Care

## 2020-05-23 ENCOUNTER — Encounter: Payer: Self-pay | Admitting: *Deleted

## 2020-05-23 ENCOUNTER — Other Ambulatory Visit: Payer: Self-pay | Admitting: *Deleted

## 2020-05-23 DIAGNOSIS — R221 Localized swelling, mass and lump, neck: Secondary | ICD-10-CM

## 2020-05-23 NOTE — Telephone Encounter (Signed)
Order has been placed,  PCC's will get scheduled.

## 2020-05-23 NOTE — Addendum Note (Signed)
Addended by: Amado Coe on: 05/23/2020 04:18 PM   Modules accepted: Orders

## 2020-05-23 NOTE — Progress Notes (Signed)
Per NP Volanda Napoleon, patient's case was discussed at cancer conference today.  I updated her on recommendations.

## 2020-05-23 NOTE — Telephone Encounter (Signed)
Spoke with patient and updated him on PET scan results and next steps. He is on board with proceeding with IR bx.   Lauren can we please get Mr Gillian scheduled for IR - BX of neck lymph node. He does not need general anesthesia.

## 2020-05-23 NOTE — Telephone Encounter (Signed)
-----   Message from Garner Nash, DO sent at 05/23/2020  9:39 AM EST ----- Regarding: RE: update I wasn't. But we will get scheduled.  Sorry in another meeting Thanks Rich Number  - I would send to IR for neck node biospy. He doesn't need general anesthesia for that.  Thanks Brad     ----- Message ----- From: Valrie Hart, RN Sent: 05/23/2020   9:30 AM EST To: Garner Nash, DO, Martyn Ehrich, NP Subject: update                                         Annie Paras, I am not sure if you were on caner conference today. Mr. Grape case was discussed and tx recommendations are [place] bx lymph node or bronchoscopy.  Thanks Hinton Dyer

## 2020-05-23 NOTE — Progress Notes (Signed)
The proposed treatment discussed in cancer conference is for discussion purpose only and is not a binding recommendation. The patient was not physically examined nor present for their treatment options. Therefore, final treatment plans cannot be decided.  ?

## 2020-05-26 ENCOUNTER — Telehealth: Payer: Self-pay | Admitting: Pulmonary Disease

## 2020-05-26 NOTE — Telephone Encounter (Signed)
PCCM:  Received a call from the answering service  I called and spoke with the patient requesting up date on next steps.   He is very nervous about all the news that he has received this week.  I promised that there was a team of working on his next steps and appointments.  [facility] 05/26/2020 4:37 PM

## 2020-05-27 NOTE — Telephone Encounter (Signed)
Thank you, I have spoke with him several times throughout this process. If he has any further questions I would recommend he get set up with an apt with Dr. Ander Slade

## 2020-05-29 NOTE — Telephone Encounter (Signed)
Spoke with patient. He was calling about getting the biopsy scheduled. He stated that he was told last Thursday that it would be ordered. I see that it was ordered but then the order was cancelled and a new order was never placed. Patient was very upset that this has not been taken care of.   Will route to the procedure pool for follow up.

## 2020-05-30 NOTE — Telephone Encounter (Signed)
Patient called back wanting to know the delay.  The correct order was place on 05/23/20 correctly per IR  Told patient to call IR and ask what the delay was for scheduling.  According to Judeen Hammans our [facility] the IR staff would schedule once order was placed.   Nothing further needed at this time.

## 2020-05-30 NOTE — Addendum Note (Signed)
Addended by: Amado Coe on: 05/30/2020 10:50 AM   Modules accepted: Orders

## 2020-05-31 ENCOUNTER — Encounter (HOSPITAL_COMMUNITY): Payer: Self-pay

## 2020-05-31 NOTE — Progress Notes (Unsigned)
RB    1       Shloimy Michalski Male, 75 y.o., [birth year]  MRN:  [id number] Phone:  [phone] Jerilynn Mages)       PCP:  Bernerd Limbo, MD Primary Cvg:  Medicare/Medicare Part A And B  Next Appt With Radiology ([facility]-US 2) 06/11/2020 at 1:00 PM           RE: Biopsy Received: Today  Message Details  Suttle, Rosanne Ashing, MD  L[facility]ers E Approved for ultrasound guided right cervical lymph node biopsy.   Dylan    Previous Messages  ----- Message -----  From: Lenore Cordia  Sent: 05/31/2020  8:54 AM EST  To: Ir Procedure Requests  Subject: Biopsy                      Procedure Requested: CT US GUIDED BIOPSY    Reason for Procedure: neck nodal metastases    Provider Requesting: Garner Nash  Provider Telephone: [phone]    Other Info:

## 2020-06-02 ENCOUNTER — Encounter (HOSPITAL_COMMUNITY): Payer: Self-pay

## 2020-06-02 ENCOUNTER — Emergency Department (HOSPITAL_COMMUNITY): Payer: Medicare Other

## 2020-06-02 ENCOUNTER — Inpatient Hospital Stay (HOSPITAL_COMMUNITY)
Admission: EM | Admit: 2020-06-02 | Discharge: 2020-06-13 | DRG: 579 | Disposition: A | Discharge status: Hospice | Payer: Medicare Other | Attending: Internal Medicine | Admitting: Internal Medicine

## 2020-06-02 ENCOUNTER — Other Ambulatory Visit: Payer: Self-pay

## 2020-06-02 DIAGNOSIS — C439 Malignant melanoma of skin, unspecified: Secondary | ICD-10-CM | POA: Diagnosis present

## 2020-06-02 DIAGNOSIS — C7951 Secondary malignant neoplasm of bone: Secondary | ICD-10-CM | POA: Diagnosis present

## 2020-06-02 DIAGNOSIS — Z9641 Presence of insulin pump (external) (internal): Secondary | ICD-10-CM | POA: Diagnosis present

## 2020-06-02 DIAGNOSIS — W06XXXA Fall from bed, initial encounter: Secondary | ICD-10-CM | POA: Diagnosis present

## 2020-06-02 DIAGNOSIS — T380X5A Adverse effect of glucocorticoids and synthetic analogues, initial encounter: Secondary | ICD-10-CM | POA: Diagnosis not present

## 2020-06-02 DIAGNOSIS — E1065 Type 1 diabetes mellitus with hyperglycemia: Secondary | ICD-10-CM | POA: Diagnosis present

## 2020-06-02 DIAGNOSIS — R531 Weakness: Secondary | ICD-10-CM | POA: Diagnosis not present

## 2020-06-02 DIAGNOSIS — I129 Hypertensive chronic kidney disease with stage 1 through stage 4 chronic kidney disease, or unspecified chronic kidney disease: Secondary | ICD-10-CM | POA: Diagnosis present

## 2020-06-02 DIAGNOSIS — C7801 Secondary malignant neoplasm of right lung: Secondary | ICD-10-CM | POA: Diagnosis present

## 2020-06-02 DIAGNOSIS — F419 Anxiety disorder, unspecified: Secondary | ICD-10-CM | POA: Diagnosis present

## 2020-06-02 DIAGNOSIS — C7931 Secondary malignant neoplasm of brain: Secondary | ICD-10-CM | POA: Diagnosis present

## 2020-06-02 DIAGNOSIS — N189 Chronic kidney disease, unspecified: Secondary | ICD-10-CM | POA: Diagnosis present

## 2020-06-02 DIAGNOSIS — R590 Localized enlarged lymph nodes: Secondary | ICD-10-CM | POA: Diagnosis present

## 2020-06-02 DIAGNOSIS — E43 Unspecified severe protein-calorie malnutrition: Secondary | ICD-10-CM | POA: Diagnosis not present

## 2020-06-02 DIAGNOSIS — E890 Postprocedural hypothyroidism: Secondary | ICD-10-CM | POA: Diagnosis present

## 2020-06-02 DIAGNOSIS — G936 Cerebral edema: Secondary | ICD-10-CM | POA: Diagnosis present

## 2020-06-02 DIAGNOSIS — Z66 Do not resuscitate: Secondary | ICD-10-CM | POA: Diagnosis present

## 2020-06-02 DIAGNOSIS — E1022 Type 1 diabetes mellitus with diabetic chronic kidney disease: Secondary | ICD-10-CM | POA: Diagnosis present

## 2020-06-02 DIAGNOSIS — Z7189 Other specified counseling: Secondary | ICD-10-CM

## 2020-06-02 DIAGNOSIS — Z79899 Other long term (current) drug therapy: Secondary | ICD-10-CM

## 2020-06-02 DIAGNOSIS — J441 Chronic obstructive pulmonary disease with (acute) exacerbation: Secondary | ICD-10-CM | POA: Diagnosis present

## 2020-06-02 DIAGNOSIS — Z20822 Contact with and (suspected) exposure to covid-19: Secondary | ICD-10-CM | POA: Diagnosis present

## 2020-06-02 DIAGNOSIS — J9611 Chronic respiratory failure with hypoxia: Secondary | ICD-10-CM | POA: Diagnosis present

## 2020-06-02 DIAGNOSIS — R270 Ataxia, unspecified: Secondary | ICD-10-CM | POA: Diagnosis present

## 2020-06-02 DIAGNOSIS — C189 Malignant neoplasm of colon, unspecified: Secondary | ICD-10-CM | POA: Diagnosis present

## 2020-06-02 DIAGNOSIS — Z6820 Body mass index (BMI) 20.0-20.9, adult: Secondary | ICD-10-CM

## 2020-06-02 DIAGNOSIS — R739 Hyperglycemia, unspecified: Secondary | ICD-10-CM | POA: Diagnosis not present

## 2020-06-02 DIAGNOSIS — Z888 Allergy status to other drugs, medicaments and biological substances status: Secondary | ICD-10-CM

## 2020-06-02 DIAGNOSIS — E039 Hypothyroidism, unspecified: Secondary | ICD-10-CM | POA: Diagnosis present

## 2020-06-02 DIAGNOSIS — C7972 Secondary malignant neoplasm of left adrenal gland: Secondary | ICD-10-CM | POA: Diagnosis present

## 2020-06-02 DIAGNOSIS — E875 Hyperkalemia: Secondary | ICD-10-CM | POA: Diagnosis not present

## 2020-06-02 DIAGNOSIS — D496 Neoplasm of unspecified behavior of brain: Secondary | ICD-10-CM

## 2020-06-02 DIAGNOSIS — E46 Unspecified protein-calorie malnutrition: Secondary | ICD-10-CM | POA: Diagnosis present

## 2020-06-02 DIAGNOSIS — C7802 Secondary malignant neoplasm of left lung: Secondary | ICD-10-CM | POA: Diagnosis present

## 2020-06-02 DIAGNOSIS — I10 Essential (primary) hypertension: Secondary | ICD-10-CM | POA: Diagnosis not present

## 2020-06-02 DIAGNOSIS — J9621 Acute and chronic respiratory failure with hypoxia: Secondary | ICD-10-CM | POA: Diagnosis present

## 2020-06-02 DIAGNOSIS — G4733 Obstructive sleep apnea (adult) (pediatric): Secondary | ICD-10-CM | POA: Diagnosis present

## 2020-06-02 DIAGNOSIS — C799 Secondary malignant neoplasm of unspecified site: Secondary | ICD-10-CM | POA: Diagnosis not present

## 2020-06-02 DIAGNOSIS — E10319 Type 1 diabetes mellitus with unspecified diabetic retinopathy without macular edema: Secondary | ICD-10-CM | POA: Diagnosis present

## 2020-06-02 DIAGNOSIS — R918 Other nonspecific abnormal finding of lung field: Secondary | ICD-10-CM

## 2020-06-02 DIAGNOSIS — I480 Paroxysmal atrial fibrillation: Secondary | ICD-10-CM | POA: Diagnosis present

## 2020-06-02 DIAGNOSIS — R0602 Shortness of breath: Secondary | ICD-10-CM

## 2020-06-02 DIAGNOSIS — Z794 Long term (current) use of insulin: Secondary | ICD-10-CM

## 2020-06-02 DIAGNOSIS — Z515 Encounter for palliative care: Secondary | ICD-10-CM | POA: Diagnosis not present

## 2020-06-02 DIAGNOSIS — Z87891 Personal history of nicotine dependence: Secondary | ICD-10-CM

## 2020-06-02 DIAGNOSIS — Z79890 Hormone replacement therapy (postmenopausal): Secondary | ICD-10-CM

## 2020-06-02 DIAGNOSIS — K5900 Constipation, unspecified: Secondary | ICD-10-CM | POA: Diagnosis present

## 2020-06-02 DIAGNOSIS — Z9981 Dependence on supplemental oxygen: Secondary | ICD-10-CM

## 2020-06-02 DIAGNOSIS — E440 Moderate protein-calorie malnutrition: Secondary | ICD-10-CM | POA: Diagnosis present

## 2020-06-02 LAB — PROTIME-INR
INR: 1.1 (ref 0.8–1.2)
Prothrombin Time: 13.5 seconds (ref 11.4–15.2)

## 2020-06-02 LAB — LACTIC ACID, PLASMA: Lactic Acid, Venous: 1.9 mmol/L (ref 0.5–1.9)

## 2020-06-02 LAB — CBC
HCT: 42.8 % (ref 39.0–52.0)
Hemoglobin: 14 g/dL (ref 13.0–17.0)
MCH: 31.3 pg (ref 26.0–34.0)
MCHC: 32.7 g/dL (ref 30.0–36.0)
MCV: 95.7 fL (ref 80.0–100.0)
Platelets: 281 10*3/uL (ref 150–400)
RBC: 4.47 MIL/uL (ref 4.22–5.81)
RDW: 15.9 % — ABNORMAL HIGH (ref 11.5–15.5)
WBC: 12.8 10*3/uL — ABNORMAL HIGH (ref 4.0–10.5)
nRBC: 0 % (ref 0.0–0.2)

## 2020-06-02 LAB — DIFFERENTIAL
Abs Immature Granulocytes: 0.06 10*3/uL (ref 0.00–0.07)
Basophils Absolute: 0 10*3/uL (ref 0.0–0.1)
Basophils Relative: 0 %
Eosinophils Absolute: 0 10*3/uL (ref 0.0–0.5)
Eosinophils Relative: 0 %
Immature Granulocytes: 1 %
Lymphocytes Relative: 5 %
Lymphs Abs: 0.6 10*3/uL — ABNORMAL LOW (ref 0.7–4.0)
Monocytes Absolute: 1.8 10*3/uL — ABNORMAL HIGH (ref 0.1–1.0)
Monocytes Relative: 14 %
Neutro Abs: 10.2 10*3/uL — ABNORMAL HIGH (ref 1.7–7.7)
Neutrophils Relative %: 80 %

## 2020-06-02 LAB — ETHANOL: Alcohol, Ethyl (B): <10 mg/dL (ref <10)

## 2020-06-02 LAB — BLOOD GAS, VENOUS
Acid-base deficit: 0.6 mmol/L (ref 0.0–2.0)
Bicarbonate: 26.9 mmol/L (ref 20.0–28.0)
O2 Saturation: 63.7 %
Patient temperature: 98.6
pCO2, Ven: 59.6 mmHg (ref 44.0–60.0)
pH, Ven: 7.276 (ref 7.250–7.430)
pO2, Ven: 37.2 mmHg (ref 32.0–45.0)

## 2020-06-02 LAB — I-STAT CHEM 8, ED
BUN: 59 mg/dL — ABNORMAL HIGH (ref 8–23)
Calcium, Ion: 1.07 mmol/L — ABNORMAL LOW (ref 1.15–1.40)
Chloride: 99 mmol/L (ref 98–111)
Creatinine, Ser: 0.8 mg/dL (ref 0.61–1.24)
Glucose, Bld: 426 mg/dL — ABNORMAL HIGH (ref 70–99)
HCT: 47 % (ref 39.0–52.0)
Hemoglobin: 16 g/dL (ref 13.0–17.0)
Potassium: 7.8 mmol/L (ref 3.5–5.1)
Sodium: 132 mmol/L — ABNORMAL LOW (ref 135–145)
TCO2: 32 mmol/L (ref 22–32)

## 2020-06-02 LAB — APTT: aPTT: 32 seconds (ref 24–36)

## 2020-06-02 LAB — BETA-HYDROXYBUTYRIC ACID: Beta-Hydroxybutyric Acid: 1.12 mmol/L — ABNORMAL HIGH (ref 0.05–0.27)

## 2020-06-02 LAB — CBG MONITORING, ED
Glucose-Capillary: 400 mg/dL — ABNORMAL HIGH (ref 70–99)
Glucose-Capillary: 385 mg/dL — ABNORMAL HIGH (ref 70–99)

## 2020-06-02 LAB — POTASSIUM: Potassium: 4.3 mmol/L (ref 3.5–5.1)

## 2020-06-02 MED ORDER — DEXTROSE 50 % IV SOLN: 0.0000 mL | INTRAVENOUS | Status: DC | PRN

## 2020-06-02 MED ORDER — DEXTROSE IN LACTATED RINGERS 5 % IV SOLN: INTRAVENOUS | Status: DC

## 2020-06-02 MED ORDER — DEXAMETHASONE SODIUM PHOSPHATE 10 MG/ML IJ SOLN
10.0000 mg | Freq: Once | INTRAMUSCULAR | Status: AC
  Administered 2020-06-03: 10 mg via INTRAVENOUS
  Filled 2020-06-02: qty 1

## 2020-06-02 MED ORDER — INSULIN REGULAR(HUMAN) IN NACL 100-0.9 UT/100ML-% IV SOLN
INTRAVENOUS | Status: DC
  Administered 2020-06-02: 10 [IU]/h via INTRAVENOUS
  Filled 2020-06-02: qty 100

## 2020-06-02 MED ORDER — LACTATED RINGERS IV SOLN: INTRAVENOUS | Status: DC

## 2020-06-02 MED ORDER — DEXAMETHASONE SODIUM PHOSPHATE 10 MG/ML IJ SOLN
10.0000 mg | Freq: Every day | INTRAMUSCULAR | Status: DC
  Administered 2020-06-03 – 2020-06-06 (×4): 10 mg via INTRAVENOUS
  Filled 2020-06-02 (×4): qty 1

## 2020-06-02 NOTE — ED Triage Notes (Signed)
Pt BIB EMS Pt fell yesterday, caused insulin pump to fall out. 500 CBG upon EMS arrival. 18 gauge left forearm, 900 ml NS.   Vitals 140/72 HR 98 100% 3L CBG 500

## 2020-06-02 NOTE — ED Provider Notes (Signed)
Arthur [name] DEPT Provider Note   CSN: [id number] Arrival date & time: 06/02/20  2128     History Chief Complaint  Patient presents with  . Hyperglycemia    Arthur [name] Age is a 75 y.o. Arthur [name] [name].  HPI   This patient is a 75 year old Arthur [name] [name], Arthur [name] Arthur [name] [name], Arthur [name] Arthur [name] [name], Arthur [name] Arthur [name] [name], Arthur [name] Arthur [name] [name] patient was recently diagnosed with lung cancer  In January Arthur [name] had a CT scan that showed a right upper lobe lung mass and some mediastinal lymphadenopathy.  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  Arthur [name] is on a [employer] insulin pump, his last A1c was 6.8  Chief complaint is hyperglycemia  Arthur [name] [name] patient has had a blood sugar of over 500, it is persistent, Arthur [name], not associated with vomiting or diarrhea, Arthur [name] has taken his home insulin by insulin pump without significant improvement  Paramedics report that his blood sugar was just over 500, vital signs were otherwise unremarkable on his home Arthur [name] [name].  Pt states that for approximately 3 days Arthur [name] has had weakness of his left leg and feels like it is just giving out so Arthur [name] cannot stand on it.  Arthur [name] is trying to get on his feet and get around Arthur [name] [name] house but had a fall overnight last night.  Arthur [name] was on Arthur [name] [name] ground for a couple of hours, paramedics were called to help him get up, his insulin pump came out, they helped him get it back in but his blood sugar has been elevated today as high as 500.  Arthur [name] is having ongoing hyperglycemia and feels like his legs still abnormal but only notices it when Arthur [name] stands.  Arthur [name] denies any weakness in Arthur [name] [name] arms or Arthur [name] [name] right leg.  No difficulty with speech or vision.  Past Medical History:  Diagnosis Date  . Atrial Arthur [name] [name] ([place])   . Arthur [name] Arthur [name] [name]   . 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 Arthur [name]   . Type 2  diabetes mellitus ([place])   . Vitiligo     Patient Active Problem List   Diagnosis Date Noted  . Hemoptysis 05/07/2020  . OSA treated with BiPAP 05/07/2020  . Arthur [name] respiratory failure with hypoxia ([place]) 11/15/2018  . COPD GOLD IV  11/14/2018    Past Surgical History:  Procedure Laterality Date  . MELANOMA EXCISION         Family History  Problem Relation Age of Onset  . Stroke Mother   . Cancer Father     Social History   Tobacco Use  . Smoking status: Former Smoker    Packs/day: 1.50    Years: 25.00    Pack years: 68.50    Quit date: 04/20/1988    Years since quitting: 32.1  . Smokeless tobacco: Never Used  Vaping Use  . Vaping Use: Never used  Substance Use Topics  . Alcohol use: Yes    Home Medications Prior to Admission medications   Medication Sig Start Date End Date Taking? Authorizing Provider  albuterol (VENTOLIN HFA) 108 (90 Base) MCG/ACT inhaler Inhale 2 puffs into Arthur [name] [name] lungs every 6 (six) hours as needed for wheezing or shortness of breath.    [provider]  ALLOPURINOL PO Take 300 mg by mouth daily.    [provider]  diltiazem (CARDIZEM) 120 MG tablet Take 120 mg by mouth  daily.    [provider]  doxycycline (VIBRA-TABS) 100 MG tablet Take 1 tablet (100 mg total) by mouth 2 (two) times daily. 04/16/20   Laurin Coder, MD  flecainide (TAMBOCOR) 100 MG tablet Take 100 mg by mouth 2 (two) times daily.    [provider]  Fluticasone-Umeclidin-Vilant (TRELEGY ELLIPTA) 200-62.5-25 MCG/INH AEPB Inhale 1 puff into Arthur [name] [name] lungs daily. 05/02/20   Martyn Ehrich, NP  Fluticasone-Umeclidin-Vilant (TRELEGY ELLIPTA) 200-62.5-25 MCG/INH AEPB Inhale 1 puff into Arthur [name] [name] lungs daily. 05/09/20   Martyn Ehrich, NP  hydrochlorothiazide (MICROZIDE) 12.5 MG capsule Take 12.5 mg by mouth daily.    [provider]  hydroxychloroquine (PLAQUENIL) 200 MG tablet  05/15/19   [provider]  insulin aspart (NOVOLOG)  100 UNIT/ML injection Inject into Arthur [name] [name] skin as directed.    [provider]  levothyroxine (SYNTHROID) 88 MCG tablet Take 88 mcg by mouth daily before breakfast.    [provider]  Arthur [name] [name] 3lpm with exertion    [provider]  potassium chloride SA (K-DUR) 20 MEQ tablet Take 20 mEq by mouth daily.    [provider]  Respiratory Therapy Supplies (FLUTTER) DEVI Use as directed 11/14/18   Tanda Rockers, MD  Respiratory Therapy Supplies (FLUTTER) DEVI 1 Device by Does not apply route as needed. 08/09/19   Tanda Rockers, MD  temazepam (RESTORIL) 30 MG capsule Take 30 mg by mouth at bedtime as needed for sleep.    [provider]  torsemide (DEMADEX) 10 MG tablet Take 10 mg by mouth daily.    [provider]  UNABLE TO FIND Med Name: BIPAP with 3lpm o2    [provider]    Allergies    Mefloquine hcl  Review of Systems   Review of Systems  All other systems reviewed and are negative.   Physical Exam Updated Vital Signs BP (!) 153/72 (BP Location: Right Arm)   Pulse (!) 101   Temp 98.1 F (36.7 C) (Oral)   Resp 16   Ht 1.778 m (5\' 10" )   Wt 66.2 kg   SpO2 100%   BMI 20.94 kg/m   Physical Exam Vitals and nursing note reviewed.  Constitutional:      General: Arthur [name] is not in acute distress.    Appearance: Arthur [name] is well-developed and well-nourished.  HENT:     Head: Normocephalic and atraumatic.     Mouth/Throat:     Mouth: Oropharynx is clear and moist.     Pharynx: No oropharyngeal exudate.  Eyes:     General: No scleral icterus.       Right eye: No discharge.        Left eye: No discharge.     Extraocular Movements: EOM normal.     Conjunctiva/sclera: Conjunctivae normal.     Pupils: Pupils are equal, round, and reactive to light.  Neck:     Thyroid: No thyromegaly.     Vascular: No JVD.  Cardiovascular:     Rate and Rhythm: Normal rate and regular rhythm.     Pulses: Intact distal pulses.     Heart sounds:  Normal heart sounds. No murmur heard. No friction rub. No gallop.   Pulmonary:     Effort: No respiratory distress.     Breath sounds: Wheezing present. No rales.     Comments: Arthur [name] [name] patient has increased expiratory time and prolonged expiratory phase with diffuse wheezing, is able to speak in shortened sentences, states this is normal  for him Abdominal:     General: Bowel sounds are normal. There is no distension.     Palpations: Abdomen is soft. There is no mass.     Tenderness: There is no abdominal tenderness.  Musculoskeletal:        General: No tenderness or edema. Normal range of motion.     Cervical back: Normal range of motion and neck supple.     Comments: There is no edema, Arthur [name] [name] patient is very thin  Lymphadenopathy:     Cervical: No cervical adenopathy.  Skin:    General: Skin is warm and dry.     Findings: No erythema or rash.  Neurological:     Mental Status: Arthur [name] is alert.     Coordination: Coordination normal.     Comments: Arthur [name] [name] patient has a focal weakness of Arthur [name] [name] left lower extremity, it is 4 out of 5 in strength compared to 5 out of 5 in Arthur [name] [name] right lower extremity.  This includes Arthur [name] [name] hip knee and ankle.  Arthur [name] [name] left upper extremity is normal, cranial nerves III through XII are normal  Psychiatric:        Mood and Affect: Mood and affect normal.        Behavior: Behavior normal.     ED Results / Procedures / Treatments   Labs (all labs ordered are listed, but only abnormal results are displayed) Labs Reviewed  CBC - Abnormal; Notable for Arthur [name] [name] following components:      Result Value   WBC 12.8 (*)    RDW 15.9 (*)    All other components within normal limits  DIFFERENTIAL - Abnormal; Notable for Arthur [name] [name] following components:   Neutro Abs 10.2 (*)    Lymphs Abs 0.6 (*)    Monocytes Absolute 1.8 (*)    All other components within normal limits  CBG MONITORING, ED - Abnormal; Notable for Arthur [name] [name] following components:   Glucose-Capillary 400 (*)    All other components within  normal limits  I-STAT CHEM 8, ED - Abnormal; Notable for Arthur [name] [name] following components:   Sodium 132 (*)    Potassium 7.8 (*)    BUN 59 (*)    Glucose, Bld 426 (*)    Calcium, Ion 1.07 (*)    All other components within normal limits  RESP PANEL BY RT-PCR (FLU A&B, COVID) ARPGX2  ETHANOL  PROTIME-INR  APTT  COMPREHENSIVE METABOLIC PANEL  RAPID URINE DRUG SCREEN, HOSP PERFORMED  URINALYSIS, ROUTINE W REFLEX MICROSCOPIC  BETA-HYDROXYBUTYRIC ACID  LACTIC ACID, PLASMA  LACTIC ACID, PLASMA  BLOOD GAS, VENOUS  POTASSIUM    EKG EKG Interpretation  Date/Time:  Sunday June 02 2020 21:49:19 EST Ventricular Rate:  101 PR Interval:    QRS Duration: 98 QT Interval:  325 QTC Calculation: 422 R Axis:   79 Text Interpretation: Sinus tachycardia Prolonged PR interval Probable left atrial enlargement Anterior infarct, old Since last tracing rate slower Confirmed by Noemi Chapel [phone]) on 06/02/2020 10:11:45 PM   Radiology CT HEAD WO CONTRAST  Result Date: 06/02/2020 CLINICAL DATA:  [place].  History of metastatic lung cancer. EXAM: CT HEAD WITHOUT CONTRAST TECHNIQUE: Contiguous axial images were obtained from Arthur [name] [name] base of Arthur [name] [name] skull through Arthur [name] [name] vertex without intravenous contrast. COMPARISON:  PET scan 05/21/2020 FINDINGS: Brain: No focal abnormality seen affecting Arthur [name] [name] brainstem or cerebellum. There is a 1 cm mass at Arthur [name] [name] inferior frontal lobe on Arthur [name] [name] right with surrounding edema. There is a necrotic mass in Arthur [name] [name] right frontal lobe measuring 3.3 cm in diameter,  with surrounding edema, consistent with a metastasis. There is a necrotic mass at Arthur [name] [name] right parietal vertex measuring up to 2.8 cm in diameter consistent with a metastasis. Some surrounding edema in this location as well. No metastasis to Arthur [name] [name] left hemisphere identified. No hydrocephalus. Right-to-left shift of 2 mm. No extra-axial collection. Vascular: There is atherosclerotic calcification of Arthur [name] [name] major vessels at Arthur [name] [name] base of Arthur [name] [name] brain. Skull: No  fracture. No evidence skull or skull base metastatic lesion. Sinuses/Orbits: Mucosal inflammatory changes of Arthur [name] [name] left maxillary sinus and left ethmoid sinuses. Orbits negative. Other: None IMPRESSION: 1 cm solid mass in Arthur [name] [name] inferior frontal lobe on Arthur [name] [name] right with mild edema. Two necrotic masses in Arthur [name] [name] right hemisphere, located in Arthur [name] [name] right frontal lobe and right parietal vertex consistent with metastases. Surrounding edema. 2 mm of right-to-left shift. No hydrocephalus. No traumatic finding. Non necrotic portions of Arthur [name] [name] metastases are slightly hyperdense, likely indicating Arthur [name] [name] presence of petechial hemorrhage. This hemorrhage probably relates to Arthur [name] [name] biology of Arthur [name] [name] tumor rather than Arthur [name] [name] recent fall. Electronically Signed   By: Nelson Chimes M.D.   On: 06/02/2020 22:23   DG Chest Portable 1 View  Result Date: 06/02/2020 CLINICAL DATA:  Shortness of breath.  COPD.  Lung cancer suspected. EXAM: PORTABLE CHEST 1 VIEW COMPARISON:  05/02/2020 radiography.  PET scan 05/21/2020 FINDINGS: Heart size is normal. Right hilar adenopathy and right paratracheal adenopathy have worsened. Mass lesion in Arthur [name] [name] right upper lobe consistent with a malignancy shown by PET scan. Other scattered pulmonary densities consistent with intrapulmonary metastatic Arthur [name] [name] also shown better by PET. No consolidation, collapse or effusion. No focal bone lesion. IMPRESSION: Worsened right hilar and right paratracheal adenopathy. Mass lesion in Arthur [name] [name] right upper lobe consistent with a malignancy shown by PET scan. Other scattered pulmonary densities consistent with intrapulmonary metastatic Arthur [name] [name] also better shown by PET. No other acute process. Electronically Signed   By: Nelson Chimes M.D.   On: 06/02/2020 22:16    Procedures .Critical Care Performed by: Noemi Chapel, MD Authorized by: Noemi Chapel, MD   Critical care provider statement:    Critical care time (minutes):  35   Critical care time was exclusive of:  Separately billable  procedures and treating other patients and teaching time   Critical care was necessary to treat or prevent imminent or life-threatening deterioration of Arthur [name] [name] following conditions:  CNS failure or compromise and endocrine crisis   Critical care was time spent personally by me on Arthur [name] [name] following activities:  Blood draw for specimens, development of treatment plan with patient or surrogate, discussions with consultants, evaluation of patient's response to treatment, examination of patient, obtaining history from patient or surrogate, ordering and performing treatments and interventions, ordering and review of laboratory studies, ordering and review of radiographic studies, pulse oximetry, re-evaluation of patient's condition and review of old charts     Medications Ordered in ED Medications  insulin regular, human (MYXREDLIN) 100 units/ 100 mL infusion (10 Units/hr Intravenous New Bag/Given 06/02/20 2249)  lactated ringers infusion (has no administration in time range)  dextrose 5 % in lactated ringers infusion (has no administration in time range)  dextrose 50 % solution 0-50 mL (has no administration in time range)  dexamethasone (DECADRON) injection 10 mg (has no administration in time range)    ED Course  I have reviewed Arthur [name] [name] triage vital signs and Arthur [name] [name] nursing notes.  Pertinent labs & imaging results that were available during my care of Arthur [name] [name] patient were reviewed by me and considered in my medical  decision making (see chart for details).    MDM Rules/Calculators/A&P                          This patient is hyperglycemic measured at 400 on arrival, Arthur [name] has far more concerning things at this time including Arthur [name] [name] new weakness of his left leg which could be any number of things, this could be related to a metabolic abnormality but is more likely to be related to either a stroke or a compressive anatomic lesion from his known cancer.  It is unclear whether Arthur [name] has metastatic Arthur [name] [name] to his brain but Arthur [name]  does have metastatic Arthur [name] [name] to other parts of his body.  At this time Arthur [name] will undergo CT scan of Arthur [name] [name] brain and treatment for his hyperglycemia.  I discussed Arthur [name] [name] care with Arthur [name] [name] oncologist Dr. Earlie Server who recommends Arthur [name] [name] patient be admitted to [name] hospital, agrees with Decadron, agrees with insulin, they will see Arthur [name] [name] patient in consultation tomorrow, agrees that neurosurgery does not need to be consulted at this time.  Labs reveal that Arthur [name] [name] patient has a normal creatinine, his BUN is 59, his glucose was 426 and a CO2 of 32.  Comprehensive metabolic panel is pending, CBC shows a slight leukocytosis.  Will base decision on potassium based on CMP, EKG shows no signs of hyperkalemia.  This patient is critically ill, brain metastatic Arthur [name] [name], weakness of Arthur [name] [name] leg, hyperglycemia, needs care for multiple services.  Final Clinical Impression(s) / ED Diagnoses Final diagnoses:  Metastatic malignant neoplasm, unspecified site [facility])  Hyperglycemia  Brain tumor ([place])      Noemi Chapel, MD 06/02/20 2257

## 2020-06-03 ENCOUNTER — Encounter (HOSPITAL_COMMUNITY): Payer: Self-pay | Admitting: Family Medicine

## 2020-06-03 ENCOUNTER — Inpatient Hospital Stay (HOSPITAL_COMMUNITY): Payer: Medicare Other

## 2020-06-03 ENCOUNTER — Ambulatory Visit
Admit: 2020-06-03 | Discharge: 2020-06-03 | Disposition: A | Discharge status: Home / Self Care | Payer: Medicare Other | Attending: Radiation Oncology | Admitting: Radiation Oncology

## 2020-06-03 DIAGNOSIS — C7931 Secondary malignant neoplasm of brain: Secondary | ICD-10-CM

## 2020-06-03 DIAGNOSIS — E039 Hypothyroidism, unspecified: Secondary | ICD-10-CM | POA: Diagnosis present

## 2020-06-03 DIAGNOSIS — E875 Hyperkalemia: Secondary | ICD-10-CM | POA: Diagnosis not present

## 2020-06-03 DIAGNOSIS — R739 Hyperglycemia, unspecified: Secondary | ICD-10-CM

## 2020-06-03 DIAGNOSIS — J9611 Chronic respiratory failure with hypoxia: Secondary | ICD-10-CM | POA: Diagnosis not present

## 2020-06-03 DIAGNOSIS — I10 Essential (primary) hypertension: Secondary | ICD-10-CM | POA: Diagnosis not present

## 2020-06-03 DIAGNOSIS — E43 Unspecified severe protein-calorie malnutrition: Secondary | ICD-10-CM

## 2020-06-03 DIAGNOSIS — I480 Paroxysmal atrial fibrillation: Secondary | ICD-10-CM | POA: Diagnosis present

## 2020-06-03 DIAGNOSIS — E46 Unspecified protein-calorie malnutrition: Secondary | ICD-10-CM | POA: Diagnosis present

## 2020-06-03 LAB — COMPREHENSIVE METABOLIC PANEL
ALT: 15 U/L (ref 0–44)
AST: 24 U/L (ref 15–41)
Albumin: 3.8 g/dL (ref 3.5–5.0)
Alkaline Phosphatase: 117 U/L (ref 38–126)
Anion gap: 11 (ref 5–15)
BUN: 35 mg/dL — ABNORMAL HIGH (ref 8–23)
CO2: 28 mmol/L (ref 22–32)
Calcium: 8.9 mg/dL (ref 8.9–10.3)
Chloride: 98 mmol/L (ref 98–111)
Creatinine, Ser: 0.99 mg/dL (ref 0.61–1.24)
GFR, Estimated: >60 mL/min (ref >60)
Glucose, Bld: 419 mg/dL — ABNORMAL HIGH (ref 70–99)
Potassium: 4.3 mmol/L (ref 3.5–5.1)
Sodium: 137 mmol/L (ref 135–145)
Total Bilirubin: 1.2 mg/dL (ref 0.3–1.2)
Total Protein: 6.5 g/dL (ref 6.5–8.1)

## 2020-06-03 LAB — BASIC METABOLIC PANEL
Anion gap: 8 (ref 5–15)
BUN: 30 mg/dL — ABNORMAL HIGH (ref 8–23)
CO2: 31 mmol/L (ref 22–32)
Calcium: 8.7 mg/dL — ABNORMAL LOW (ref 8.9–10.3)
Chloride: 102 mmol/L (ref 98–111)
Creatinine, Ser: 0.68 mg/dL (ref 0.61–1.24)
GFR, Estimated: >60 mL/min (ref >60)
Glucose, Bld: 211 mg/dL — ABNORMAL HIGH (ref 70–99)
Potassium: 3.8 mmol/L (ref 3.5–5.1)
Sodium: 141 mmol/L (ref 135–145)

## 2020-06-03 LAB — POCT I-STAT, CHEM 8
BUN: 59 mg/dL — ABNORMAL HIGH (ref 8–23)
Calcium, Ion: 1.07 mmol/L — ABNORMAL LOW (ref 1.15–1.40)
Chloride: 99 mmol/L (ref 98–111)
Creatinine, Ser: 0.8 mg/dL (ref 0.61–1.24)
Glucose, Bld: 426 mg/dL — ABNORMAL HIGH (ref 70–99)
HCT: 47 % (ref 39.0–52.0)
Hemoglobin: 16 g/dL (ref 13.0–17.0)
Potassium: 7.8 mmol/L (ref 3.5–5.1)
Sodium: 132 mmol/L — ABNORMAL LOW (ref 135–145)
TCO2: 32 mmol/L (ref 22–32)

## 2020-06-03 LAB — RAPID URINE DRUG SCREEN, HOSP PERFORMED
Amphetamines: NOT DETECTED
Barbiturates: NOT DETECTED
Benzodiazepines: POSITIVE — AB
Cocaine: NOT DETECTED
Opiates: NOT DETECTED
Tetrahydrocannabinol: NOT DETECTED

## 2020-06-03 LAB — URINALYSIS, ROUTINE W REFLEX MICROSCOPIC
Bacteria, UA: NONE SEEN
Bilirubin Urine: NEGATIVE
Glucose, UA: >=500 mg/dL — AB
Hgb urine dipstick: NEGATIVE
Ketones, ur: 20 mg/dL — AB
Leukocytes,Ua: NEGATIVE
Nitrite: NEGATIVE
Protein, ur: NEGATIVE mg/dL
Specific Gravity, Urine: 1.017 (ref 1.005–1.030)
pH: 5 (ref 5.0–8.0)

## 2020-06-03 LAB — CBC
HCT: 37.9 % — ABNORMAL LOW (ref 39.0–52.0)
Hemoglobin: 12.6 g/dL — ABNORMAL LOW (ref 13.0–17.0)
MCH: 32.1 pg (ref 26.0–34.0)
MCHC: 33.2 g/dL (ref 30.0–36.0)
MCV: 96.7 fL (ref 80.0–100.0)
Platelets: 261 10*3/uL (ref 150–400)
RBC: 3.92 MIL/uL — ABNORMAL LOW (ref 4.22–5.81)
RDW: 15.8 % — ABNORMAL HIGH (ref 11.5–15.5)
WBC: 12.2 10*3/uL — ABNORMAL HIGH (ref 4.0–10.5)
nRBC: 0 % (ref 0.0–0.2)

## 2020-06-03 LAB — LACTIC ACID, PLASMA: Lactic Acid, Venous: 1.2 mmol/L (ref 0.5–1.9)

## 2020-06-03 LAB — GLUCOSE, CAPILLARY
Glucose-Capillary: 150 mg/dL — ABNORMAL HIGH (ref 70–99)
Glucose-Capillary: 163 mg/dL — ABNORMAL HIGH (ref 70–99)
Glucose-Capillary: 242 mg/dL — ABNORMAL HIGH (ref 70–99)
Glucose-Capillary: 380 mg/dL — ABNORMAL HIGH (ref 70–99)

## 2020-06-03 LAB — CBG MONITORING, ED
Glucose-Capillary: 138 mg/dL — ABNORMAL HIGH (ref 70–99)
Glucose-Capillary: 144 mg/dL — ABNORMAL HIGH (ref 70–99)
Glucose-Capillary: 146 mg/dL — ABNORMAL HIGH (ref 70–99)
Glucose-Capillary: 157 mg/dL — ABNORMAL HIGH (ref 70–99)
Glucose-Capillary: 179 mg/dL — ABNORMAL HIGH (ref 70–99)
Glucose-Capillary: 238 mg/dL — ABNORMAL HIGH (ref 70–99)
Glucose-Capillary: 294 mg/dL — ABNORMAL HIGH (ref 70–99)

## 2020-06-03 LAB — RESP PANEL BY RT-PCR (FLU A&B, COVID) ARPGX2
Influenza A by PCR: NEGATIVE
Influenza B by PCR: NEGATIVE
SARS Coronavirus 2 by RT PCR: NEGATIVE

## 2020-06-03 LAB — MRSA PCR SCREENING: MRSA by PCR: NEGATIVE

## 2020-06-03 MED ORDER — INSULIN ASPART 100 UNIT/ML ~~LOC~~ SOLN
0.0000 [IU] | Freq: Every day | SUBCUTANEOUS | Status: DC
  Administered 2020-06-03: 5 [IU] via SUBCUTANEOUS

## 2020-06-03 MED ORDER — LEVOTHYROXINE SODIUM 88 MCG PO TABS
88.0000 ug | ORAL_TABLET | Freq: Every day | ORAL | Status: DC
  Administered 2020-06-03 – 2020-06-13 (×11): 88 ug via ORAL
  Filled 2020-06-03 (×11): qty 1

## 2020-06-03 MED ORDER — CHLORHEXIDINE GLUCONATE CLOTH 2 % EX PADS
6.0000 | MEDICATED_PAD | Freq: Every day | CUTANEOUS | Status: DC
  Administered 2020-06-03 – 2020-06-08 (×5): 6 via TOPICAL

## 2020-06-03 MED ORDER — POTASSIUM CHLORIDE CRYS ER 20 MEQ PO TBCR
20.0000 meq | EXTENDED_RELEASE_TABLET | Freq: Every day | ORAL | Status: DC
  Administered 2020-06-03 – 2020-06-04 (×2): 20 meq via ORAL
  Filled 2020-06-03 (×2): qty 1

## 2020-06-03 MED ORDER — DILTIAZEM HCL 60 MG PO TABS
120.0000 mg | ORAL_TABLET | Freq: Every day | ORAL | Status: DC
  Administered 2020-06-03 – 2020-06-13 (×11): 120 mg via ORAL
  Filled 2020-06-03 (×11): qty 2

## 2020-06-03 MED ORDER — FLUTICASONE FUROATE-VILANTEROL 100-25 MCG/INH IN AEPB
1.0000 | INHALATION_SPRAY | Freq: Every day | RESPIRATORY_TRACT | Status: DC
  Administered 2020-06-04 – 2020-06-06 (×3): 1 via RESPIRATORY_TRACT
  Filled 2020-06-03: qty 28

## 2020-06-03 MED ORDER — GUAIFENESIN ER 600 MG PO TB12
1200.0000 mg | ORAL_TABLET | Freq: Two times a day (BID) | ORAL | Status: DC
  Administered 2020-06-03 – 2020-06-13 (×23): 1200 mg via ORAL
  Filled 2020-06-03 (×23): qty 2

## 2020-06-03 MED ORDER — FLECAINIDE ACETATE 100 MG PO TABS
200.0000 mg | ORAL_TABLET | Freq: Two times a day (BID) | ORAL | Status: DC
  Administered 2020-06-03 – 2020-06-13 (×22): 200 mg via ORAL
  Filled 2020-06-03 (×24): qty 2

## 2020-06-03 MED ORDER — INSULIN ASPART 100 UNIT/ML ~~LOC~~ SOLN: 0.0000 [IU] | Freq: Three times a day (TID) | SUBCUTANEOUS | Status: DC

## 2020-06-03 MED ORDER — HYDROXYCHLOROQUINE SULFATE 200 MG PO TABS
200.0000 mg | ORAL_TABLET | Freq: Every day | ORAL | Status: DC
  Administered 2020-06-03 – 2020-06-13 (×11): 200 mg via ORAL
  Filled 2020-06-03 (×12): qty 1

## 2020-06-03 MED ORDER — TEMAZEPAM 15 MG PO CAPS
30.0000 mg | ORAL_CAPSULE | Freq: Every evening | ORAL | Status: DC | PRN
  Administered 2020-06-03 – 2020-06-13 (×10): 30 mg via ORAL
  Filled 2020-06-03 (×10): qty 2

## 2020-06-03 MED ORDER — FLUTICASONE-UMECLIDIN-VILANT 200-62.5-25 MCG/INH IN AEPB: 1.0000 | INHALATION_SPRAY | Freq: Every day | RESPIRATORY_TRACT | Status: DC

## 2020-06-03 MED ORDER — ALLOPURINOL 300 MG PO TABS
300.0000 mg | ORAL_TABLET | Freq: Every day | ORAL | Status: DC
  Administered 2020-06-03 – 2020-06-13 (×11): 300 mg via ORAL
  Filled 2020-06-03: qty 1
  Filled 2020-06-03 (×2): qty 3
  Filled 2020-06-03 (×2): qty 1
  Filled 2020-06-03: qty 3
  Filled 2020-06-03: qty 1
  Filled 2020-06-03 (×2): qty 3
  Filled 2020-06-03 (×2): qty 1
  Filled 2020-06-03: qty 3

## 2020-06-03 MED ORDER — INSULIN ASPART 100 UNIT/ML ~~LOC~~ SOLN
3.0000 [IU] | Freq: Three times a day (TID) | SUBCUTANEOUS | Status: DC
  Administered 2020-06-03 – 2020-06-04 (×2): 3 [IU] via SUBCUTANEOUS

## 2020-06-03 MED ORDER — HYDROCHLOROTHIAZIDE 12.5 MG PO CAPS
12.5000 mg | ORAL_CAPSULE | Freq: Every day | ORAL | Status: DC
  Administered 2020-06-03 – 2020-06-08 (×6): 12.5 mg via ORAL
  Filled 2020-06-03 (×6): qty 1

## 2020-06-03 MED ORDER — ORAL CARE MOUTH RINSE
15.0000 mL | Freq: Two times a day (BID) | OROMUCOSAL | Status: DC
  Administered 2020-06-03 – 2020-06-10 (×13): 15 mL via OROMUCOSAL

## 2020-06-03 MED ORDER — CHLORHEXIDINE GLUCONATE 0.12 % MT SOLN
15.0000 mL | Freq: Two times a day (BID) | OROMUCOSAL | Status: DC
  Administered 2020-06-03 – 2020-06-13 (×12): 15 mL via OROMUCOSAL
  Filled 2020-06-03 (×18): qty 15

## 2020-06-03 MED ORDER — INSULIN GLARGINE 100 UNIT/ML ~~LOC~~ SOLN
15.0000 [IU] | Freq: Every day | SUBCUTANEOUS | Status: DC
  Administered 2020-06-03 – 2020-06-04 (×2): 15 [IU] via SUBCUTANEOUS
  Filled 2020-06-03 (×2): qty 0.15

## 2020-06-03 MED ORDER — UMECLIDINIUM BROMIDE 62.5 MCG/INH IN AEPB
1.0000 | INHALATION_SPRAY | Freq: Every day | RESPIRATORY_TRACT | Status: DC
  Administered 2020-06-04 – 2020-06-05 (×2): 1 via RESPIRATORY_TRACT
  Filled 2020-06-03: qty 7

## 2020-06-03 MED ORDER — INSULIN PUMP
Freq: Three times a day (TID) | SUBCUTANEOUS | Status: DC
  Filled 2020-06-03: qty 1

## 2020-06-03 MED ORDER — INSULIN ASPART 100 UNIT/ML ~~LOC~~ SOLN
0.0000 [IU] | Freq: Three times a day (TID) | SUBCUTANEOUS | Status: DC
  Administered 2020-06-03: 3 [IU] via SUBCUTANEOUS
  Administered 2020-06-04 (×2): 9 [IU] via SUBCUTANEOUS

## 2020-06-03 MED ORDER — TORSEMIDE 10 MG PO TABS
10.0000 mg | ORAL_TABLET | Freq: Every day | ORAL | Status: DC
  Administered 2020-06-03 – 2020-06-04 (×2): 10 mg via ORAL
  Filled 2020-06-03 (×3): qty 1

## 2020-06-03 NOTE — Progress Notes (Signed)
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 Ander Slade pulmonology Positive Cologuard-refusing work-up and 02/2019 Found to have right upper lobe lung mass after office visit January-not a great candidate for bronchoscopy given severe emphysema risk of pneumothorax etc. PET scan showed metastatic disease to left adrenal and bone in addition to nodules Presented to emergency room 2/13 secondary to fall causing insulin pump to fall out and sugars were in the 500 range Also stated weakness, inability to stand, being down on the ground CT head =1 cm solid mass inferior frontal lobe on the right side with edema and necrotic masses in the right hemisphere with surrounding edema with 2 mm right to left shift Oncology consulted patient to start Decadron Neurosurgeon Dr. 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 etc. deferred to oncologist Severe COPD 3 L baseline Requiring more than 4 L to keep above 90 Keep on stepdown today Continue Trelegy daily, albuterol as needed, Mucinex 1.2 g twice daily Probable colon cancer Poor candidate for further planning and work-up Prior malignant melanoma DM TY two on insulin pump at home Pump not functioning well Poor control as expected on steroids Increased Lantus to 25 daily-change to resistant scale without at bedtime coverage and monitor trends Paroxysmal A. Fib not on anticoagulation Continue Cardizem 120 flecainide 200 twice daily   DVT prophylaxis: SCD Code Status: DNR confirmed Family Communication: Discussed with Sister Ginger on telephone today Disposition:  Status is: Inpatient  Remains inpatient appropriate because:Hemodynamically unstable, Altered mental status, Ongoing diagnostic testing needed not appropriate for outpatient work up and IV treatments appropriate due to intensity of illness or inability to take PO   Dispo: The patient is from: Home              Anticipated d/c is to: SNF              Anticipated d/c date is: 3 days              Patient currently is not medically stable to d/c.   Difficult to place patient No       Consultants:   Oncology  Radiation oncology  Procedures: None  Antimicrobials: None currently   Subjective: Awake coherent seems to have a little bit of respiratory distress No chest pain  Objective: Vitals:   06/03/20 0400 06/03/20 0500 06/03/20 0530 06/03/20 0630  BP: 127/72 130/72 117/67 129/69  Pulse: 89 89 85 89  Resp: 18 16 18 20   Temp:      TempSrc:  SpO2: 96% 100% 99% 98%  Weight:      Height:       No intake or output data in the 24 hours ending 06/03/20 0706 Filed Weights   06/02/20 2146  Weight: 66.2 kg    Examination:  EOMI NCAT quite frail Posterior laterally does have some wheeze abdomen soft no rebound No lower extremity edema Moving all four  limbs equally and sensory grossly intact to touch-full exam deferred   Data Reviewed: I have personally reviewed following labs and imaging studies  BUNs/creatinine 33/0.8  COVID-19 Labs  No results for input(s): DDIMER, FERRITIN, LDH, CRP in the last 72 hours.  Lab Results  Component Value Date   [place] NEGATIVE 06/02/2020   SARSCOV2NAA NOT DETECTED 02/21/2019     Radiology Studies: CT HEAD WO CONTRAST  Result Date: 06/02/2020 CLINICAL DATA:  [place].  History of metastatic lung cancer. EXAM: CT HEAD WITHOUT CONTRAST TECHNIQUE: Contiguous axial images were obtained from the base of the skull through the vertex without intravenous contrast. COMPARISON:  PET scan 05/21/2020 FINDINGS: Brain: No focal abnormality seen affecting the brainstem or cerebellum. There is a 1 cm mass at the inferior frontal lobe on the right with surrounding edema. There is a necrotic mass in the right frontal lobe measuring 3.3 cm in diameter, with surrounding edema, consistent with a metastasis. There is a necrotic mass at the right parietal vertex measuring up to 2.8 cm in diameter consistent with a metastasis. Some surrounding edema in this location as well. No metastasis to the left hemisphere identified. No hydrocephalus. Right-to-left shift of 2 mm. No extra-axial collection. Vascular: There is atherosclerotic calcification of the major vessels at the base of the brain. Skull: No fracture. No evidence skull or skull base metastatic lesion. Sinuses/Orbits: Mucosal inflammatory changes of the left maxillary sinus and left ethmoid sinuses. Orbits negative. Other: None IMPRESSION: 1 cm solid mass in the inferior frontal lobe on the right with mild edema. Two necrotic masses in the right hemisphere, located in the right frontal lobe and right parietal vertex consistent with metastases. Surrounding edema. 2 mm of right-to-left shift. No hydrocephalus. No traumatic finding. Non necrotic portions of the metastases are  slightly hyperdense, likely indicating the presence of petechial hemorrhage. This hemorrhage probably relates to the biology of the tumor rather than the recent fall. Electronically Signed   By: Nelson Chimes M.D.   On: 06/02/2020 22:23   DG Chest Portable 1 View  Result Date: 06/02/2020 CLINICAL DATA:  Shortness of breath.  COPD.  Lung cancer suspected. EXAM: PORTABLE CHEST 1 VIEW COMPARISON:  05/02/2020 radiography.  PET scan 05/21/2020 FINDINGS: Heart size is normal. Right hilar adenopathy and right paratracheal adenopathy have worsened. Mass lesion in the right upper lobe consistent with a malignancy shown by PET scan. 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 No consolidation, collapse or effusion. No focal bone lesion. IMPRESSION: Worsened right hilar and right paratracheal adenopathy. Mass lesion in the right upper lobe consistent with a malignancy shown by PET scan. 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 No other acute process. Electronically Signed   By: Nelson Chimes M.D.   On: 06/02/2020 22:16     Scheduled Meds: . allopurinol  300 mg Oral Daily  . dexamethasone (DECADRON) injection  10 mg Intravenous Daily  . diltiazem  120 mg Oral Daily  . flecainide  200 mg Oral BID  . fluticasone furoate-vilanterol  1 puff Inhalation Daily  . guaiFENesin  1,200 mg Oral BID  . hydrochlorothiazide  12.5 mg Oral Daily  . hydroxychloroquine  200 mg Oral Daily  . levothyroxine  88 mcg Oral Q0600  . potassium chloride SA  20 mEq Oral Daily  . torsemide  10 mg Oral Daily  . umeclidinium bromide  1 puff Inhalation Daily   Continuous Infusions: . dextrose 5% lactated ringers 125 mL/hr at 06/03/20 0144  . insulin 2.2 Units/hr (06/03/20 5035)  . lactated ringers 125 mL/hr at 06/03/20 0033     LOS: 1 day    Time spent: [place], MD [facility] To contact the attending provider between 7A-7P or the covering provider during after hours 7P-7A, please log into the web site [email or web address] and access using universal [place] password for that web site. If you do not have the password, please call the hospital operator.  06/03/2020, 7:06 AM

## 2020-06-03 NOTE — ED Notes (Signed)
Report to the floor

## 2020-06-03 NOTE — Progress Notes (Signed)
   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 Ander Slade pulmonology Positive Cologuard-refusing work-up  02/2019 Found to have right upper lobe lung mass after office visit January-not a great candidate for bronchoscopy given severe emphysema risk of pneumothorax etc. PET scan showed metastatic disease to left adrenal and bone in addition to nodules Presented to emergency room 2/13 secondary to fall causing insulin pump to fall out and sugars were in the 500 range Also stated weakness, inability to stand, being down on the ground CT head =1 cm solid mass inferior frontal lobe on the right side with edema and necrotic masses in the right hemisphere with surrounding edema with 2 mm right to left shift Oncology consulted Patient started on Decadron Neurosurgeon Dr. Glenford Peers consulted did not advise any further imaging  Hyperglycemia aggressively treated with IV insulin, but as no gap we have transitioned him to sliding scale with his insulin pump Patient tells me he lives alone and still feels weak on his left side in the upper and lower extremity  He is coherent and tells me that he does not wish any aggressive chemo or any aggressive management and understands that this is a life limiting illness with metastatic disease after we discussed this in more detail   I will enlist the care of therapy to see him to ensure feasibility of discharge and asked TOC to consult with hospice as he is made up his mind about advance planning Appreciate in advance any guidance oncology can give [place] with regards to the brain tumor and tapering of Decadron etc.  Anticipate discharge once we know a little bit morebased on impressions of therapy services   No charge  Verneita Griffes, MD [facility] 8:24 AM

## 2020-06-03 NOTE — Progress Notes (Signed)
Pt. placed on BiPAP D/S per home regimen due to holding in ED, pt. tolerating well, RN made aware.

## 2020-06-03 NOTE — ED Notes (Signed)
Provider notified of transition orders due to [facility] recommendations

## 2020-06-03 NOTE — Evaluation (Signed)
Physical Therapy Evaluation Patient Details Name: Arthur [name] MRN: [id number] DOB: 02-05-1946 Today's Date: 06/03/2020   History of Present Illness  Arthur [name] is a 75 y.o. male with medical history significant for COPD with chronic hypoxemia on baseline 4 L, paroxysmal atrial fibrillation, hypertension, OSA, type 1 diabetes with retinopathy, post ablative hypothyroidism, and recent diagnosis of lung cancer with metastasis to bone, left adrenal and mediastinal lymphadenopathy who presents with fall, left leg weakness and  hyperglycemia.  CT-Two necrotic masses in the right hemisphere, located in the  right frontal lobe and right parietal vertex consistent with  metastases.  Clinical Impression  Arthur [name] presents with significant LLE weakness, impaired sensation, , impaired sitting static balance with listing to left while sitting, requiring mod assistance to maintain upright.   Patient  Also demonstrates impaired  Function of the LUE, noted dysmetria and not able to lift arm against gravity.      Follow Up Recommendations SNF/ unsure- depending on course of treatment for cancer.    Equipment Recommendations  Wheelchair cushion (measurements PT);Wheelchair (measurements PT)    Recommendations for Other Services       Precautions / Restrictions Precautions Precautions: Fall Precaution Comments: Left LE > UE weakness,      Mobility  Bed Mobility Overal bed mobility: Needs Assistance Bed Mobility: Rolling;Sidelying to Sit;Sit to Sidelying Rolling: +2 for safety/equipment;Mod assist Sidelying to sit: +2 for physical assistance;+2 for safety/equipment;Max assist Supine to sit: +2 for physical assistance;+2 for safety/equipment;Max assist   Sit to sidelying: +2 for physical assistance;+2 for safety/equipment;Max assist General bed mobility comments: assist  Left leg  to flex in preparation to roll to right , Max assistance for Lt leg and trunk to sit upright. Pt.  immediately   listing to left, requiring max assist initially to maintain trunk control, gradually gained  trunk control near mid line when LUE placed on bed. Required assistance for left leg back onto bed.    Transfers                 General transfer comment: NT due to profound weakness LLE/UE.  Ambulation/Gait                Stairs            Wheelchair Mobility    Modified Rankin (Stroke Patients Only)       Balance Overall balance assessment: Needs assistance;History of Falls Sitting-balance support: Bilateral upper extremity supported;Feet supported Sitting balance-Leahy Scale: Poor Sitting balance - Comments: required constant trunk support while sitting x  8 minutes. Postural control: Left lateral lean                                   Pertinent Vitals/Pain      Home Living Family/patient expects to be discharged to:: Private residence Living Arrangements: Alone Available Help at Discharge: Family;Available PRN/intermittently Type of Home: House Home Access: Stairs to enter   [employer] of Steps: 4-6 Home Layout: One level Home Equipment: Walker - 2 wheels Additional Comments: on Home  O2 4 L    Prior Function Level of Independence: Needs assistance   Gait / Transfers Assistance Needed: was  independent ambulator until 2/10 when weakness began quickly           Hand Dominance        Extremity/Trunk Assessment   Upper Extremity Assessment Upper Extremity Assessment: Defer to OT evaluation LUE  Deficits / Details: dysmetria, unble to raise against gravity while sitting    Lower Extremity Assessment Lower Extremity Assessment: LLE deficits/detail LLE Deficits / Details: lacks dorsi/plantar flexion, knee extension 2+, Hip flexion 2/5, appreciates LT on left foot/leg but not extinction with testing R/L LLE Coordination: decreased fine motor;decreased gross motor       Communication   Communication: No difficulties  (voice is hoarse and effortful)  Cognition Arousal/Alertness: Awake/alert Behavior During Therapy: WFL for tasks assessed/performed Overall Cognitive Status: Within Functional Limits for tasks assessed                                        General Comments      Exercises     Assessment/Plan    PT Assessment Patient needs continued PT services  PT Problem List Decreased strength;Decreased mobility;Decreased safety awareness;Decreased range of motion;Decreased coordination       PT Treatment Interventions Functional mobility training;Therapeutic activities;Therapeutic exercise;Balance training;Neuromuscular re-education;Patient/family education    PT Goals (Current goals can be found in the Care Plan section)  Acute Rehab PT Goals Patient Stated Goal: to get up PT Goal Formulation: With patient/family Time For Goal Achievement: 06/17/20 Potential to Achieve Goals: Poor    Frequency Min 2X/week   Barriers to discharge Decreased caregiver support      Co-evaluation               AM-PAC PT "6 Clicks" Mobility  Outcome Measure Help needed turning from your back to your side while in a flat bed without using bedrails?: A Lot Help needed moving from lying on your back to sitting on the side of a flat bed without using bedrails?: A Lot Help needed moving to and from a bed to a chair (including a wheelchair)?: Total Help needed standing up from a chair using your arms (e.g., wheelchair or bedside chair)?: Total Help needed to walk in hospital room?: Total Help needed climbing 3-5 steps with a railing? : Total 6 Click Score: 8    End of Session Equipment Utilized During Treatment: Oxygen Activity Tolerance: Patient limited by fatigue Patient left: in bed;with call bell/phone within reach;with bed alarm set;with family/visitor present;with nursing/sitter in room Nurse Communication: Mobility status PT Visit Diagnosis: Difficulty in walking, not elsewhere  classified (R26.2);Other symptoms and signs involving the nervous system (R29.898);History of falling (Z91.81);Hemiplegia and hemiparesis Hemiplegia - Right/Left: Left Hemiplegia - dominant/non-dominant: Non-dominant Hemiplegia - caused by: Unspecified    Time: 1130-1203 PT Time Calculation (min) (ACUTE ONLY): 33 min   Charges:   PT Evaluation $PT Eval Moderate Complexity: 1 Mod PT Treatments $Therapeutic Activity: 8-22 mins        Tresa Endo PT Acute Rehabilitation Services Pager [phone] Office [phone]   Claretha Cooper 06/03/2020, 1:12 PM

## 2020-06-03 NOTE — Consult Note (Incomplete Revision)
[place]  [phone] [phone]   MEDICAL ONCOLOGY - INITIAL CONSULTATION   Referral MD: Dr. Nita Sells  Reason for Referral: Lung mass with brain lesions  HPI: Mr. 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  The patient presented to the emergency room following a fall with hyperglycemia.  For several days prior to admission, the patient was having difficulty with ambulation.  He fell out of bed the night prior to admission due to left lower extremity weakness and laid on the ground for about 2 hours until he was able to get back up.  He also noted a blood sugar of 400-500 over the past few days prior to admission.   The patient had a CT of the head without contrast performed in the emergency room which showed a 1 cm solid mass in the inferior frontal lobe on the right with mild edema and 2 necrotic masses in the right hemisphere located in the right frontal lobe and the right parietal vertex consistent with metastases.  There was surrounding edema.  There was 2 mm of right to left shift and no hydrocephalus.  Patient has been started on dexamethasone.  Of note, the patient has been followed by pulmonology and had a chest x-ray performed 05/02/2020 which showed a right upper lobe 2.4 cm pulmonary nodule.  This was followed with a CTA chest on 05/06/2020 which showed a dominant mass lesion in the right upper lobe with multiple parenchymal nodules bilaterally and significant mediastinal and hilar adenopathy bilaterally-findings likely represent a primary pulmonary neoplasm with associated metastatic disease.  Additionally, the patient had a PET scan performed 05/21/2020 which showed a hypermetabolic irregular 3.3 cm anterior right upper lobe lung  mass compatible with primary bronchogenic carcinoma, numerous (greater than 10) irregular solid bilateral pulmonary nodules the largest of which are mildly hypermetabolic and compatible with metastatic disease, bulky hypermetabolic ipsilateral hilar, bilateral mediastinal, and bilateral neck nodal metastases, hypermetabolic left adrenal metastasis, hypermetabolic bone metastases to the lumbar spine and sacrum.  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  The patient was referred to interventional radiology for biopsy of one of his lymph nodes.  This is currently scheduled for next week.  I met with the patient in his hospital room.  His sister/HCPOA was at the bedside.  The patient tells me that he has had a poor appetite and has lost about 15 to 20 pounds over the past 1 to 2 months.  No fevers or chills reported.  He is not currently having any headaches or dizziness.  He reports a productive cough and previously had hemoptysis which has now resolved.  He has his baseline shortness of breath and some chest discomfort over his sternum.  He is not having any abdominal pain, nausea, vomiting, constipation, diarrhea.  No lower extremity edema reported.  The patient is single and he does not have any children.  He lives alone.  The patient reports that he previously drank alcohol but has not had any alcohol recently.  He previously smoked about 1/2 pack cigarettes per day.  Family history significant for a father with leukemia.  Ankle oncology was asked see the patient make recommendations regarding his lung mass and brain lesions.   Past Medical History:  Diagnosis Date  . Atrial fibrillation ([place])   .  Chronic kidney disease   . COPD (chronic obstructive pulmonary disease) ([place])   . Hypertension   . Type 2 diabetes mellitus ([place])   . Vitiligo   :  Past Surgical History:  Procedure Laterality Date  . MELANOMA EXCISION    :  Current Facility-Administered Medications  Medication Dose Route Frequency  Provider Last Rate Last Admin  . allopurinol (ZYLOPRIM) tablet 300 mg  300 mg Oral Daily Tu, Ching T, DO   300 mg at 06/03/20 1202  . chlorhexidine (PERIDEX) 0.12 % solution 15 mL  15 mL Mouth Rinse BID Curt Bears, MD   15 mL at 06/03/20 1224  . Chlorhexidine Gluconate Cloth 2 % PADS 6 each  6 each Topical Daily Curt Bears, MD   6 each at 06/03/20 1000  . dexamethasone (DECADRON) injection 10 mg  10 mg Intravenous Daily Tu, Ching T, DO   10 mg at 06/03/20 1203  . dextrose 5 % in lactated ringers infusion   Intravenous Continuous Noemi Chapel, MD 125 mL/hr at 06/03/20 0144 New Bag at 06/03/20 0144  . dextrose 50 % solution 0-50 mL  0-50 mL Intravenous PRN Noemi Chapel, MD      . diltiazem (CARDIZEM) tablet 120 mg  120 mg Oral Daily Tu, Ching T, DO   120 mg at 06/03/20 1202  . flecainide (TAMBOCOR) tablet 200 mg  200 mg Oral BID Tu, Ching T, DO   200 mg at 06/03/20 1223  . fluticasone furoate-vilanterol (BREO ELLIPTA) 100-25 MCG/INH 1 puff  1 puff Inhalation Daily Tu, Ching T, DO      . guaiFENesin (MUCINEX) 12 hr tablet 1,200 mg  1,200 mg Oral BID Tu, Ching T, DO   1,200 mg at 06/03/20 1201  . hydrochlorothiazide (MICROZIDE) capsule 12.5 mg  12.5 mg Oral Daily Tu, Ching T, DO   12.5 mg at 06/03/20 1154  . hydroxychloroquine (PLAQUENIL) tablet 200 mg  200 mg Oral Daily Tu, Ching T, DO   200 mg at 06/03/20 1154  . insulin aspart (novoLOG) injection 0-9 Units  0-9 Units Subcutaneous TID WC Samtani, Jai-Gurmukh, MD      . insulin aspart (novoLOG) injection 3 Units  3 Units Subcutaneous TID WC Samtani, Jai-Gurmukh, MD      . insulin glargine (LANTUS) injection 15 Units  15 Units Subcutaneous Daily Samtani, Jai-Gurmukh, MD      . insulin pump   Subcutaneous TID WC, HS, 0200 Samtani, Jai-Gurmukh, MD      . insulin regular, human (MYXREDLIN) 100 units/ 100 mL infusion   Intravenous Continuous Noemi Chapel, MD 2.2 mL/hr at 06/03/20 0643 2.2 Units/hr at 06/03/20 0643  . levothyroxine  (SYNTHROID) tablet 88 mcg  88 mcg Oral Q0600 Tu, Ching T, DO   88 mcg at 06/03/20 0705  . MEDLINE mouth rinse  15 mL Mouth Rinse q12n4p Curt Bears, MD   15 mL at 06/03/20 1225  . potassium chloride SA (KLOR-CON) CR tablet 20 mEq  20 mEq Oral Daily Tu, Ching T, DO   20 mEq at 06/03/20 1202  . temazepam (RESTORIL) capsule 30 mg  30 mg Oral QHS PRN Tu, Ching T, DO   30 mg at 06/03/20 0136  . torsemide (DEMADEX) tablet 10 mg  10 mg Oral Daily Tu, Ching T, DO   10 mg at 06/03/20 1221  . umeclidinium bromide (INCRUSE ELLIPTA) 62.5 MCG/INH 1 puff  1 puff Inhalation Daily Tu, Ching T, DO         Allergies  Allergen  Reactions  . Mefloquine Hcl Nausea And Vomiting    Altered mental status   :  Family History  Problem Relation Age of Onset  . Stroke Mother   . Cancer Father   :  Social History   Socioeconomic History  . Marital status: Single    Spouse name: Not on file  . Number of children: Not on file  . Years of education: Not on file  . Highest education level: Not on file  Occupational History  . Not on file  Tobacco Use  . Smoking status: Former Smoker    Packs/day: 1.50    Years: 25.00    Pack years: 54.50    Quit date: 04/20/1988    Years since quitting: 32.1  . Smokeless tobacco: Never Used  Vaping Use  . Vaping Use: Never used  Substance and Sexual Activity  . Alcohol use: Yes  . Drug use: Not on file  . Sexual activity: Not on file  Other Topics Concern  . Not on file  Social History Narrative  . Not on file   Social Determinants of Health   Financial Resource Strain: Not on file  Food Insecurity: Not on file  Transportation Needs: Not on file  Physical Activity: Not on file  Stress: Not on file  Social Connections: Not on file  Intimate Partner Violence: Not on file  :  Review of Systems: A comprehensive 14 point review of systems was negative except as noted in the HPI.  Exam: Patient Vitals for the past 24 hrs:  BP Temp Temp src Pulse Resp SpO2  Height Weight  06/03/20 1202 (!) 147/72 - - - - - - -  06/03/20 0900 (!) 147/115 - - 88 (!) 21 93 % - -  06/03/20 0840 (!) 143/66 97.6 F (36.4 C) Oral - 17 92 % _0  (1.778 m) -  06/03/20 0800 133/88 - - 89 (!) 21 (!) 89 % - -  06/03/20 0745 - - - 92 19 94 % - -  06/03/20 0730 134/74 - - 97 (!) 21 (!) 88 % - -  06/03/20 0715 - - - (!) 101 (!) 23 95 % - -  06/03/20 0700 138/82 - - 98 19 99 % - -  06/03/20 0645 - - - 92 (!) 21 94 % - -  06/03/20 0630 129/69 - - 89 20 98 % - -  06/03/20 0615 - - - 88 19 100 % - -  06/03/20 0600 128/73 - - 88 17 100 % - -  06/03/20 0545 - - - 84 18 100 % - -  06/03/20 0530 117/67 - - 85 18 99 % - -  06/03/20 0515 - - - 88 19 100 % - -  06/03/20 0500 130/72 - - 89 16 100 % - -  06/03/20 0445 - - - 84 16 99 % - -  06/03/20 0430 127/68 - - 87 19 96 % - -  06/03/20 0415 - - - 89 15 95 % - -  06/03/20 0400 127/72 - - 89 18 96 % - -  06/03/20 0300 131/68 - - 88 19 100 % - -  06/03/20 0200 126/67 - - 94 18 95 % - -  06/03/20 0130 136/63 - - 99 15 95 % - -  06/03/20 0021 130/68 98.3 F (36.8 C) Oral (!) 103 17 97 % - -  06/02/20 2150 (!) 153/72 98.1 F (36.7 C) Oral (!) 101 16 100 % - -  06/02/20 2146 - - - - - - _0  (1.778 m) 66.2 kg    General: Chronically ill-appearing male,, cachectic, no distress Eyes: EOMI, PERRL, no scleral icterus.   ENT: Oral mucosa is dry, no thrush or mucositis   Lymphatics: Palpable cervical adenopathy bilaterally Respiratory: Diminished breath sounds bilaterally Cardiovascular:  Regular rate and rhythm, S1/S2, without murmur, rub or gallop.  There was no pedal edema.   GI:  abdomen was soft, flat, nontender, nondistended, without organomegaly.   Musculoskeletal:  no spinal tenderness of palpation of vertebral spine.   Skin exam was without echymosis, petichae.   Neuro exam was nonfocal. Patient was alert and oriented.  Attention was good.   Language was appropriate.  Mood was normal without depression.  Speech was not  pressured.  Thought content was not tangential.     Lab Results  Component Value Date   WBC 12.2 (H) 06/03/2020   HGB 12.6 (L) 06/03/2020   HCT 37.9 (L) 06/03/2020   PLT 261 06/03/2020   GLUCOSE 211 (H) 06/03/2020   ALT 15 06/02/2020   AST 24 06/02/2020   NA 141 06/03/2020   K 3.8 06/03/2020   CL 102 06/03/2020   CREATININE 0.68 06/03/2020   BUN 30 (H) 06/03/2020   CO2 31 06/03/2020    CT HEAD WO CONTRAST  Result Date: 06/02/2020 CLINICAL DATA:  [place].  History of metastatic lung cancer. EXAM: CT HEAD WITHOUT CONTRAST TECHNIQUE: Contiguous axial images were obtained from the base of the skull through the vertex without intravenous contrast. COMPARISON:  PET scan 05/21/2020 FINDINGS: Brain: No focal abnormality seen affecting the brainstem or cerebellum. There is a 1 cm mass at the inferior frontal lobe on the right with surrounding edema. There is a necrotic mass in the right frontal lobe measuring 3.3 cm in diameter, with surrounding edema, consistent with a metastasis. There is a necrotic mass at the right parietal vertex measuring up to 2.8 cm in diameter consistent with a metastasis. Some surrounding edema in this location as well. No metastasis to the left hemisphere identified. No hydrocephalus. Right-to-left shift of 2 mm. No extra-axial collection. Vascular: There is atherosclerotic calcification of the major vessels at the base of the brain. Skull: No fracture. No evidence skull or skull base metastatic lesion. Sinuses/Orbits: Mucosal inflammatory changes of the left maxillary sinus and left ethmoid sinuses. Orbits negative. Other: None IMPRESSION: 1 cm solid mass in the inferior frontal lobe on the right with mild edema. Two necrotic masses in the right hemisphere, located in the right frontal lobe and right parietal vertex consistent with metastases. Surrounding edema. 2 mm of right-to-left shift. No hydrocephalus. No traumatic finding. Non necrotic portions of the metastases are  slightly hyperdense, likely indicating the presence of petechial hemorrhage. This hemorrhage probably relates to the biology of the tumor rather than the recent fall. Electronically Signed   By: Nelson Chimes M.D.   On: 06/02/2020 22:23   CT Angio Chest W/Cm &/Or Wo Cm  Result Date: 05/06/2020 CLINICAL DATA:  Positive D-dimer and shortness of breath, mass on recent chest x-ray with suggestion of central lymphadenopathy. EXAM: CT ANGIOGRAPHY CHEST WITH CONTRAST TECHNIQUE: Multidetector CT imaging of the chest was performed using the standard protocol during bolus administration of intravenous contrast. Multiplanar CT image reconstructions and MIPs were obtained to evaluate the vascular anatomy. CONTRAST:  154m OMNIPAQUE IOHEXOL 350 MG/ML SOLN COMPARISON:  05/02/2020 FINDINGS: Cardiovascular: Thoracic aorta demonstrates atherosclerotic calcifications. No aneurysmal dilatation or dissection is noted. Scattered coronary  calcifications are identified. The pulmonary artery is well visualized without evidence of pulmonary emboli. No cardiac enlargement is noted. Mediastinum/Nodes: Significant mediastinal adenopathy is identified. There are multiple right paratracheal lymph nodes identified. The largest conglomeration of adenopathy measures 5.0 x 3.8 cm. Prevascular node is noted on image number 28 of series 4 measuring 13 mm in short axis. Precarinal adenopathy measures 3.7 x 3.6 cm and appears contiguous with the right paratracheal adenopathy mass. AP window lymph node on image number 40 of series 4 measures 13 mm in short axis. Right hilar adenopathy measuring 3.6 x 3.6 cm is noted on image number 52. On the same image there is evidence of left hilar lymph node measuring almost 13 mm in short axis. Subcarinal adenopathy is noted which measures at least 15 mm in short axis. Is difficult to discern the esophagus from the adjacent adenopathy which may be result in some under evaluation of size of subcarinal adenopathy.  Fullness in the right thoracic inlet is noted portion of which is consistent with adenopathy measuring at least 15 mm in short axis on image number 5 of series 4. The timing of the contrast bolus somewhat limits the evaluation of cervical adenopathy at this time. Lungs/Pleura: Lungs are well aerated and demonstrate diffuse emphysematous changes consistent with the given clinical history. In the right upper lobe there is a multilobulated mass lesion which measures 2.8 cm in greatest transverse diameter and 2.1 cm in greatest AP diameter. It measures approximately 1.4 cm in craniocaudad projection. Scattered smaller subcentimeter nodules are noted throughout the right lung. The largest of these is noted at the junction of the minor and major fissures best seen on image number 87 of series 6 measuring 8.6 mm. Smaller nodules are seen on images 51, 61, 63, 96, 99, 110, 118 and 125 of series 6. In the left lung, multiple nodules are noted. The largest of these measures 12.4 mm best seen on image number 33 of series 6. Scattered smaller nodules are noted on images 17, 21, 27, 40, 49, 60 , 67 and 81 of series 6. Additionally, there is a pleural based nodule which measures 2.2 x 1.0 cm best seen on image number 60 of series 6. Upper Abdomen: Visualized upper abdomen is within normal limits. No definitive mass is seen. No discrete sizable adenopathy is identified at this time. Musculoskeletal: No chest wall abnormality. No acute or significant osseous findings. Review of the MIP images confirms the above findings. IMPRESSION: Dominant mass lesion in the right upper lobe with multiple parenchymal nodules bilaterally and significant mediastinal and hilar adenopathy bilaterally. 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 No evidence of pulmonary emboli. Aortic Atherosclerosis  (ICD10-I70.0) and Emphysema (ICD10-J43.9). Critical Value/emergent results were called by telephone at the time of interpretation on 05/06/2020 at 4:51 pm to [facility], NP , who verbally acknowledged these results. She states she is had conversations with the patient with regards to this diagnosis. Further workup can be determined as clinically indicated but could include PET-CT imaging and tissue diagnosis Electronically Signed   By: Inez Catalina M.D.   On: 05/06/2020 16:59   NM PET Image Initial (PI) Skull Base To Thigh  Result Date: 05/21/2020 CLINICAL DATA:  Initial treatment strategy for bilateral pulmonary nodules and bulky thoracic adenopathy on recent chest CT. EXAM: NUCLEAR MEDICINE PET SKULL BASE TO THIGH TECHNIQUE: 7.4 mCi F-18 FDG  was injected intravenously. Full-ring PET imaging was performed from the skull base to thigh after the radiotracer. CT data was obtained and used for attenuation correction and anatomic localization. Fasting blood glucose: 244 mg/dl COMPARISON:  05/06/2020 chest CT angiogram. FINDINGS: Mediastinal blood pool activity: SUV max 2.3 Liver activity: SUV max NA NECK: Multiple enlarged hypermetabolic lymph nodes throughout the right greater than left neck involving levels II, III, IV and V on the right and level I on the left. Representative 1.4 cm left level Ib node with max SUV 4.8 near the left angle of mandible (series 4/image 33). Representative 1.7 cm right level IIb lymph node with max SUV 5.5 (series 4/image 32). Representative 1.8 cm right level IV neck lymph node with max SUV 4.5 (series 4/image 41). Representative 1.7 cm right level V neck lymph node with max SUV 5.2 (series 4/image 39). Incidental CT findings: Mucoperiosteal thickening throughout the left greater than right maxillary sinuses and ethmoidal air cells, with no fluid levels. CHEST: Hypermetabolic irregular 3.3 cm anterior right upper lobe lung mass with max SUV 4.5 (series 8/image 20). Numerous  (greater than 10) irregular solid pulmonary nodules scattered throughout both lungs, the largest of which demonstrate mild hypermetabolism with representative peripheral left upper lobe 2.2 cm nodule with max SUV 2.7 (series 8/image 33) and representative 0.4 cm peripheral left upper lobe nodule with max SUV 2.0 (series 8/image 19). Mildly enlarged hypermetabolic 1.1 cm left axillary lymph node with max SUV 2.7 (series 4/image 54). No hypermetabolic right axillary nodes. Bulky hypermetabolic bilateral mediastinal adenopathy involving prevascular, AP window, paratracheal and subcarinal chains. Representative 1.6 cm left prevascular node with max SUV 4.5 (series 4/image 60). Representative 1.6 cm right prevascular node with max SUV 5.2 (series 4/image 69). Representative 4.9 cm right paratracheal node with max SUV 4.3 (series 4/image 63). Representative 1.8 cm subcarinal node with max SUV 4.6 (series 4/image 81). Hypermetabolic 2.6 cm right hilar node with max SUV 4.4 (series 4/image 78). No hypermetabolic left hilar nodes. Incidental CT findings: Coronary atherosclerosis. Atherosclerotic nonaneurysmal thoracic aorta. Severe centrilobular emphysema. ABDOMEN/PELVIS: Hypermetabolic 1.9 cm left adrenal nodule with density 21 HU and max SUV 3.0. New hypermetabolic right adrenal nodules. No abnormal hypermetabolic activity within the liver, pancreas or spleen. No hypermetabolic lymph nodes in the abdomen or pelvis. Incidental CT findings: Nonobstructing 7 mm upper left renal stone. Simple 3.4 cm lower left renal cyst. Atherosclerotic nonaneurysmal abdominal aorta. SKELETON: Hypermetabolic midline sacral lesion with max SUV 4.2 and hypermetabolic L2 vertebral lesion with max SUV 5.1, relatively CT occult. Incidental CT findings: none IMPRESSION: 1. Hypermetabolic irregular 3.3 cm anterior right upper lobe lung mass, compatible with primary bronchogenic carcinoma. 2. Numerous (greater than 10) irregular solid bilateral  pulmonary nodules, the largest of which are mildly hypermetabolic, compatible with metastatic disease. 3. Bulky hypermetabolic ipsilateral hilar, bilateral mediastinal and bilateral neck nodal metastases. 4. Hypermetabolic left adrenal metastasis. 5. 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 6. 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 7. Chronic findings include: Aortic Atherosclerosis (ICD10-I70.0) and Emphysema (ICD10-J43.9). Coronary atherosclerosis. Nonobstructing left nephrolithiasis. Chronic appearing bilateral paranasal sinusitis. Electronically Signed   By: Ilona Sorrel M.D.   On: 05/21/2020 14:06   DG Chest Portable 1 View  Result Date: 06/02/2020 CLINICAL DATA:  Shortness of breath.  COPD.  Lung cancer suspected. EXAM: PORTABLE CHEST 1 VIEW COMPARISON:  05/02/2020 radiography.  PET scan 05/21/2020 FINDINGS: Heart size is normal. Right hilar adenopathy and right paratracheal adenopathy have worsened. Mass  lesion in the right upper lobe consistent with a malignancy shown by PET scan. 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 No consolidation, collapse or effusion. No focal bone lesion. IMPRESSION: Worsened right hilar and right paratracheal adenopathy. Mass lesion in the right upper lobe consistent with a malignancy shown by PET scan. 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 No other acute process. Electronically Signed   By: Nelson Chimes M.D.   On: 06/02/2020 22:16     CT HEAD WO CONTRAST  Result Date: 06/02/2020 CLINICAL DATA:  [place].  History of metastatic lung cancer. EXAM: CT HEAD WITHOUT CONTRAST TECHNIQUE: Contiguous axial images were obtained from the base of the skull through the vertex without intravenous contrast. COMPARISON:  PET scan 05/21/2020 FINDINGS: Brain: No focal abnormality seen affecting the brainstem or cerebellum.  There is a 1 cm mass at the inferior frontal lobe on the right with surrounding edema. There is a necrotic mass in the right frontal lobe measuring 3.3 cm in diameter, with surrounding edema, consistent with a metastasis. There is a necrotic mass at the right parietal vertex measuring up to 2.8 cm in diameter consistent with a metastasis. Some surrounding edema in this location as well. No metastasis to the left hemisphere identified. No hydrocephalus. Right-to-left shift of 2 mm. No extra-axial collection. Vascular: There is atherosclerotic calcification of the major vessels at the base of the brain. Skull: No fracture. No evidence skull or skull base metastatic lesion. Sinuses/Orbits: Mucosal inflammatory changes of the left maxillary sinus and left ethmoid sinuses. Orbits negative. Other: None IMPRESSION: 1 cm solid mass in the inferior frontal lobe on the right with mild edema. Two necrotic masses in the right hemisphere, located in the right frontal lobe and right parietal vertex consistent with metastases. Surrounding edema. 2 mm of right-to-left shift. No hydrocephalus. No traumatic finding. Non necrotic portions of the metastases are slightly hyperdense, likely indicating the presence of petechial hemorrhage. This hemorrhage probably relates to the biology of the tumor rather than the recent fall. Electronically Signed   By: Nelson Chimes M.D.   On: 06/02/2020 22:23   CT Angio Chest W/Cm &/Or Wo Cm  Result Date: 05/06/2020 CLINICAL DATA:  Positive D-dimer and shortness of breath, mass on recent chest x-ray with suggestion of central lymphadenopathy. EXAM: CT ANGIOGRAPHY CHEST WITH CONTRAST TECHNIQUE: Multidetector CT imaging of the chest was performed using the standard protocol during bolus administration of intravenous contrast. Multiplanar CT image reconstructions and MIPs were obtained to evaluate the vascular anatomy. CONTRAST:  127m OMNIPAQUE IOHEXOL 350 MG/ML SOLN COMPARISON:  05/02/2020 FINDINGS:  Cardiovascular: Thoracic aorta demonstrates atherosclerotic calcifications. No aneurysmal dilatation or dissection is noted. Scattered coronary calcifications are identified. The pulmonary artery is well visualized without evidence of pulmonary emboli. No cardiac enlargement is noted. Mediastinum/Nodes: Significant mediastinal adenopathy is identified. There are multiple right paratracheal lymph nodes identified. The largest conglomeration of adenopathy measures 5.0 x 3.8 cm. Prevascular node is noted on image number 28 of series 4 measuring 13 mm in short axis. Precarinal adenopathy measures 3.7 x 3.6 cm and appears contiguous with the right paratracheal adenopathy mass. AP window lymph node on image number 40 of series 4 measures 13 mm in short axis. Right hilar adenopathy measuring 3.6 x 3.6 cm is noted on image number 52. On the same image there is evidence of left hilar lymph node measuring almost 13 mm in short axis. Subcarinal  adenopathy is noted which measures at least 15 mm in short axis. Is difficult to discern the esophagus from the adjacent adenopathy which may be result in some under evaluation of size of subcarinal adenopathy. Fullness in the right thoracic inlet is noted portion of which is consistent with adenopathy measuring at least 15 mm in short axis on image number 5 of series 4. The timing of the contrast bolus somewhat limits the evaluation of cervical adenopathy at this time. Lungs/Pleura: Lungs are well aerated and demonstrate diffuse emphysematous changes consistent with the given clinical history. In the right upper lobe there is a multilobulated mass lesion which measures 2.8 cm in greatest transverse diameter and 2.1 cm in greatest AP diameter. It measures approximately 1.4 cm in craniocaudad projection. Scattered smaller subcentimeter nodules are noted throughout the right lung. The largest of these is noted at the junction of the minor and major fissures best seen on image number 87 of  series 6 measuring 8.6 mm. Smaller nodules are seen on images 51, 61, 63, 96, 99, 110, 118 and 125 of series 6. In the left lung, multiple nodules are noted. The largest of these measures 12.4 mm best seen on image number 33 of series 6. Scattered smaller nodules are noted on images 17, 21, 27, 40, 49, 60 , 67 and 81 of series 6. Additionally, there is a pleural based nodule which measures 2.2 x 1.0 cm best seen on image number 60 of series 6. Upper Abdomen: Visualized upper abdomen is within normal limits. No definitive mass is seen. No discrete sizable adenopathy is identified at this time. Musculoskeletal: No chest wall abnormality. No acute or significant osseous findings. Review of the MIP images confirms the above findings. IMPRESSION: Dominant mass lesion in the right upper lobe with multiple parenchymal nodules bilaterally and significant mediastinal and hilar adenopathy bilaterally. 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 No evidence of pulmonary emboli. Aortic Atherosclerosis (ICD10-I70.0) and Emphysema (ICD10-J43.9). Critical Value/emergent results were called by telephone at the time of interpretation on 05/06/2020 at 4:51 pm to [facility], NP , who verbally acknowledged these results. She states she is had conversations with the patient with regards to this diagnosis. Further workup can be determined as clinically indicated but could include PET-CT imaging and tissue diagnosis Electronically Signed   By: Inez Catalina M.D.   On: 05/06/2020 16:59   NM PET Image Initial (PI) Skull Base To Thigh  Result Date: 05/21/2020 CLINICAL DATA:  Initial treatment strategy for bilateral pulmonary nodules and bulky thoracic adenopathy on recent chest CT. EXAM: NUCLEAR MEDICINE PET SKULL BASE TO THIGH TECHNIQUE: 7.4 mCi F-18 FDG was injected intravenously.  Full-ring PET imaging was performed from the skull base to thigh after the radiotracer. CT data was obtained and used for attenuation correction and anatomic localization. Fasting blood glucose: 244 mg/dl COMPARISON:  05/06/2020 chest CT angiogram. FINDINGS: Mediastinal blood pool activity: SUV max 2.3 Liver activity: SUV max NA NECK: Multiple enlarged hypermetabolic lymph nodes throughout the right greater than left neck involving levels II, III, IV and V on the right and level I on the left. Representative 1.4 cm left level Ib node with max SUV 4.8 near the left angle of mandible (series 4/image 33). Representative 1.7 cm right level IIb lymph node with max SUV 5.5 (series 4/image 32). Representative 1.8 cm right level IV neck lymph node with  max SUV 4.5 (series 4/image 41). Representative 1.7 cm right level V neck lymph node with max SUV 5.2 (series 4/image 39). Incidental CT findings: Mucoperiosteal thickening throughout the left greater than right maxillary sinuses and ethmoidal air cells, with no fluid levels. CHEST: Hypermetabolic irregular 3.3 cm anterior right upper lobe lung mass with max SUV 4.5 (series 8/image 20). Numerous (greater than 10) irregular solid pulmonary nodules scattered throughout both lungs, the largest of which demonstrate mild hypermetabolism with representative peripheral left upper lobe 2.2 cm nodule with max SUV 2.7 (series 8/image 33) and representative 0.4 cm peripheral left upper lobe nodule with max SUV 2.0 (series 8/image 19). Mildly enlarged hypermetabolic 1.1 cm left axillary lymph node with max SUV 2.7 (series 4/image 54). No hypermetabolic right axillary nodes. Bulky hypermetabolic bilateral mediastinal adenopathy involving prevascular, AP window, paratracheal and subcarinal chains. Representative 1.6 cm left prevascular node with max SUV 4.5 (series 4/image 60). Representative 1.6 cm right prevascular node with max SUV 5.2 (series 4/image 69). Representative 4.9 cm right  paratracheal node with max SUV 4.3 (series 4/image 63). Representative 1.8 cm subcarinal node with max SUV 4.6 (series 4/image 81). Hypermetabolic 2.6 cm right hilar node with max SUV 4.4 (series 4/image 78). No hypermetabolic left hilar nodes. Incidental CT findings: Coronary atherosclerosis. Atherosclerotic nonaneurysmal thoracic aorta. Severe centrilobular emphysema. ABDOMEN/PELVIS: Hypermetabolic 1.9 cm left adrenal nodule with density 21 HU and max SUV 3.0. New hypermetabolic right adrenal nodules. No abnormal hypermetabolic activity within the liver, pancreas or spleen. No hypermetabolic lymph nodes in the abdomen or pelvis. Incidental CT findings: Nonobstructing 7 mm upper left renal stone. Simple 3.4 cm lower left renal cyst. Atherosclerotic nonaneurysmal abdominal aorta. SKELETON: Hypermetabolic midline sacral lesion with max SUV 4.2 and hypermetabolic L2 vertebral lesion with max SUV 5.1, relatively CT occult. Incidental CT findings: none IMPRESSION: 1. Hypermetabolic irregular 3.3 cm anterior right upper lobe lung mass, compatible with primary bronchogenic carcinoma. 2. Numerous (greater than 10) irregular solid bilateral pulmonary nodules, the largest of which are mildly hypermetabolic, compatible with metastatic disease. 3. Bulky hypermetabolic ipsilateral hilar, bilateral mediastinal and bilateral neck nodal metastases. 4. Hypermetabolic left adrenal metastasis. 5. 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 6. 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 7. Chronic findings include: Aortic Atherosclerosis (ICD10-I70.0) and Emphysema (ICD10-J43.9). Coronary atherosclerosis. Nonobstructing left nephrolithiasis. Chronic appearing bilateral paranasal sinusitis. Electronically Signed   By: Ilona Sorrel M.D.   On: 05/21/2020 14:06   DG Chest Portable 1 View  Result Date: 06/02/2020 CLINICAL DATA:  Shortness of breath.  COPD.  Lung cancer suspected. EXAM: PORTABLE CHEST 1 VIEW COMPARISON:   05/02/2020 radiography.  PET scan 05/21/2020 FINDINGS: Heart size is normal. Right hilar adenopathy and right paratracheal adenopathy have worsened. Mass lesion in the right upper lobe consistent with a malignancy shown by PET scan. 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 No consolidation, collapse or effusion. No focal bone lesion. IMPRESSION: Worsened right hilar and right paratracheal adenopathy. Mass lesion in the right upper lobe consistent with a malignancy shown by PET scan. 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 No other acute process. Electronically Signed   By: Nelson Chimes M.D.   On: 06/02/2020 22:16   Assessment and Plan:  This is a very pleasant 75 year old male who presented with a right upper lobe lung mass with significant mediastinal and hilar adenopathy.  In addition, he has findings concerning  for adrenal metastasis, bone metastases, and now brain metastases.  I reviewed the imaging findings with the patient and his sister.  We discussed that findings are highly suspicious for metastatic lung cancer.  We discussed that we would need to proceed with a biopsy to confirm the diagnosis and determine what treatment options are available.  The patient and his sister have expressed that they would like to proceed with a biopsy to determine the diagnosis.  The patient has also expressed to me that he would be interested in radiation to his brain lesions and would like to meet with radiation oncology.  The patient has also expressed that he is not sure he would want to proceed with chemotherapy but after further discussion he states that he would consider treatment with other modalities such as immunotherapy or targeted therapy if he was found to be a candidate.    Recommend for him to undergo a CT-guided biopsy of one of his lymph nodes by IR.  This was  previously scheduled as an outpatient next week and I have ordered it today to be done inpatient.  I have also reached out to radiation oncology to request a consult on this patient for consideration of brain radiation.  I will defer to radiation oncology to order any additional imaging studies that they may need.  We will also defer to radiation oncology regarding dosing of dexamethasone for his brain metastasis and vasogenic edema.  Thank you for this referral.   Mikey Bussing, DNP, AGPCNP-BC, AOCNP  ADDENDUM: Hematology/Oncology Attending: The patient is seen and examined today.  I agree with the above note.  I recommended his care plan.  This is a very pleasant 75 years old white male with past medical history significant for COPD, hypertension, atrial fibrillation, diabetes mellitus as well as hypothyroidism.  The patient was admitted from the emergency department few days ago after a fall with hyperglycemia.  He has a known history of lung mass that was detected on previous chest x-ray and CT scan of the chest on May 06, 2020 which showed a dominant mass lesion in the right upper lobe with multiple parenchymal nodules bilaterally and significant mediastinal and hilar adenopathy bilaterally suspicious for primary lung cancer with associated metastatic disease.  He was undergoing evaluation by pulmonary medicine and a PET scan was performed on May 21, 2020 that showed 3.3 cm anterior right upper lobe lung mass compatible with the primary bronchogenic carcinoma with numerous irregular solid bilateral pulmonary nodules as well as bulky hypermetabolic ipsilateral hilar, bilateral mediastinal and bilateral neck nodal metastasis and suspicious hypermetabolic left adrenal metastasis and bone metastasis to the lumbar spine and sacrum.  During his evaluation at the emergency department he had CT scan of the head which was followed by MRI of the brain later today and it showed at least 5 brain metastasis  with the largest lesion showing blood products and moderate vasogenic edema.  There was potential for 2 additional subcentimeter metastasis.  The patient was started on Decadron which helped with the vasogenic edema.  He was also seen by radiation oncology for evaluation and recommendation regarding his brain metastasis. 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 We recommended for the patient to have ultrasound-guided core biopsy of one of the most accessible neck lymph node for confirmation of the tissue diagnosis. The patient will continue on the tapered dose of Decadron for now and this will be adjusted by radiation oncology as  a proceed with the palliative radiotherapy to the brain. Once the tissue diagnosis is confirmed, I will discuss with the patient has systemic treatment options which may include a combination of chemotherapy and immunotherapy or targeted therapy based on the final pathology and molecular work-up. I will arrange for the patient a follow-up appointment with me at the [facility] after discharge for more evaluation and discussion of his treatment options. Thank you so much for allowing me to participate in the care of Mr. Reyansh. I will continue to follow-up the patient with you and assist in his management on as-needed basis during this hospitalization.

## 2020-06-03 NOTE — Consult Note (Signed)
Radiation Oncology         [phone] ________________________________  Name: [name]        MRN: [id number]  Date of Service: 06/03/20 DOB: [birth year]  GL:OVFIEP, Shanon Brow, MD    REFERRING PHYSICIAN: Dr. Verlon Au  DIAGNOSIS: The primary encounter diagnosis was Metastatic malignant neoplasm, unspecified site [facility]). Diagnoses of Hyperglycemia, Brain tumor ([place]), and Lung mass were also pertinent to this visit.   HISTORY OF PRESENT ILLNESS: Mithcell [name] is a 75 y.o. male seen at the request of Dr. Verlon Au with the hospitalist service for a probable diagnosis of metastatic lung cancer.  The patient has a history of COPD and has been oxygen dependent for many years, he is managed under the care of Dr. Ander Slade . He recently described hemoptysis and a chest x-ray was ordered and performed on 05/02/2020 there was concern for a right upper lobe mass and paratracheal thickening, and CT angio of the chest was performed on 05/06/2020 and showed a dominant mass lesion in the right upper lobe with multiple nodules bilaterally and significant mediastinal and hilar adenopathy bilaterally concerning for primary pulmonary neoplasm he was encouraged to continue work-up, initially there were thoughts of bronchoscopy but because of his lung function it was felt that PET scan would be best and then considering additional options for biopsy thereafter.  A PET scan on 05/21/2020 showed hypermetabolic irregular 3.3 cm right upper lobe mass with numerous greater than 10 irregular solid bilateral pulmonary nodules consistent with metastatic disease with bulky hypermetabolic ipsilateral hilar bilateral mediastinal and bilateral neck nodes, hypermetabolism was seen in the left adrenal gland, lumbar spine at L2 and sacrum.  Additional chronic findings were seen consistent with atherosclerotic disease both of the aorta and coronary vessels, chronic appearing sinusitis bilaterally, nonobstructing left nephrolithiasis and stigmata  of COPD.  Yesterday he fell and presented to the emergency department and was short of breath at that time, a chest x-ray showed worsened right hilar and right paratracheal adenopathy with a mass lesion in the right upper lobe consistent with malignancy.  A CT of the head without contrast was ordered and revealed a 1 cm solid mass in the inferior frontal lobe of the right brain with mild edema and 2 necrotic masses in the right hemisphere 1 in the right frontal lobe measuring 3.3 cm with surrounding edema consistent with metastasis and a necrotic mass in the right parietal vertex measuring up to 2.8 cm some surrounding edema was also noted no metastasis in the left hemisphere was noted and no hydrocephalus but right to left midline shift of 2 mm was seen, inflammatory changes were seen of the left maxillary and ethmoid sinuses without evidence of disease in the skull.  He was started on dexamethasone and has had some improvement, he is currently receiving Decadron 10 mg daily.  Of note his insulin pump became dislodged with his fall and his blood sugars have been managed with the inpatient team.  We have been asked to consider options of radiotherapy.  He is also been seen by medical oncology inpatient and will likely discharge with plans to me Dr. Julien Nordmann after confirmation of his suspected diagnosis.   PREVIOUS RADIATION THERAPY: No   PAST MEDICAL HISTORY:  Past Medical History:  Diagnosis Date  . Atrial fibrillation ([place])   . Chronic kidney disease   . COPD (chronic obstructive pulmonary disease) ([place])   . Hypertension   . Type 2 diabetes mellitus ([place])   . Vitiligo  PAST SURGICAL HISTORY: Past Surgical History:  Procedure Laterality Date  . MELANOMA EXCISION       FAMILY HISTORY:  Family History  Problem Relation Age of Onset  . Stroke Mother   . Cancer Father      SOCIAL HISTORY:  reports that he quit smoking about 32 years ago. He has a 37.50 pack-year smoking history. He  has never used smokeless tobacco. He reports current alcohol use.  The patient is single and lives in [place].  His Sister Velta Addison is his healthcare power of attorney and accompanies him by phone today.   ALLERGIES: Mefloquine hcl   MEDICATIONS:  Current Facility-Administered Medications  Medication Dose Route Frequency Provider Last Rate Last Admin  . allopurinol (ZYLOPRIM) tablet 300 mg  300 mg Oral Daily Tu, Ching T, DO   300 mg at 06/03/20 1202  . chlorhexidine (PERIDEX) 0.12 % solution 15 mL  15 mL Mouth Rinse BID Curt Bears, MD   15 mL at 06/03/20 1224  . Chlorhexidine Gluconate Cloth 2 % PADS 6 each  6 each Topical Daily Curt Bears, MD   6 each at 06/03/20 1000  . dexamethasone (DECADRON) injection 10 mg  10 mg Intravenous Daily Tu, Ching T, DO   10 mg at 06/03/20 1203  . dextrose 5 % in lactated ringers infusion   Intravenous Continuous Noemi Chapel, MD 125 mL/hr at 06/03/20 1426 New Bag at 06/03/20 1426  . dextrose 50 % solution 0-50 mL  0-50 mL Intravenous PRN Noemi Chapel, MD      . diltiazem (CARDIZEM) tablet 120 mg  120 mg Oral Daily Tu, Ching T, DO   120 mg at 06/03/20 1202  . flecainide (TAMBOCOR) tablet 200 mg  200 mg Oral BID Tu, Ching T, DO   200 mg at 06/03/20 1223  . fluticasone furoate-vilanterol (BREO ELLIPTA) 100-25 MCG/INH 1 puff  1 puff Inhalation Daily Tu, Ching T, DO      . guaiFENesin (MUCINEX) 12 hr tablet 1,200 mg  1,200 mg Oral BID Tu, Ching T, DO   1,200 mg at 06/03/20 1201  . hydrochlorothiazide (MICROZIDE) capsule 12.5 mg  12.5 mg Oral Daily Tu, Ching T, DO   12.5 mg at 06/03/20 1154  . hydroxychloroquine (PLAQUENIL) tablet 200 mg  200 mg Oral Daily Tu, Ching T, DO   200 mg at 06/03/20 1154  . insulin aspart (novoLOG) injection 0-9 Units  0-9 Units Subcutaneous TID WC Samtani, Jai-Gurmukh, MD      . insulin aspart (novoLOG) injection 3 Units  3 Units Subcutaneous TID WC Samtani, Jai-Gurmukh, MD      . insulin glargine (LANTUS) injection 15  Units  15 Units Subcutaneous Daily Nita Sells, MD   15 Units at 06/03/20 1422  . insulin pump   Subcutaneous TID WC, HS, 0200 Samtani, Jai-Gurmukh, MD      . insulin regular, human (MYXREDLIN) 100 units/ 100 mL infusion   Intravenous Continuous Noemi Chapel, MD 2.2 mL/hr at 06/03/20 0643 2.2 Units/hr at 06/03/20 0643  . levothyroxine (SYNTHROID) tablet 88 mcg  88 mcg Oral Q0600 Tu, Ching T, DO   88 mcg at 06/03/20 0705  . MEDLINE mouth rinse  15 mL Mouth Rinse q12n4p Curt Bears, MD   15 mL at 06/03/20 1225  . potassium chloride SA (KLOR-CON) CR tablet 20 mEq  20 mEq Oral Daily Tu, Ching T, DO   20 mEq at 06/03/20 1202  . temazepam (RESTORIL) capsule 30 mg  30 mg Oral QHS PRN  Ileene Musa T, DO   30 mg at 06/03/20 0136  . torsemide (DEMADEX) tablet 10 mg  10 mg Oral Daily Tu, Ching T, DO   10 mg at 06/03/20 1221  . umeclidinium bromide (INCRUSE ELLIPTA) 62.5 MCG/INH 1 puff  1 puff Inhalation Daily Tu, Ching T, DO         REVIEW OF SYSTEMS: On review of systems, the patient reports that he is doing okay, his family has noticed that he has had increasing slowness in the last day or 2 with movement, in the last week he has had a hard time with weightbearing on his left leg because of pain, he also feels a sense of loss of grip strength in his hands particularly on the left.  He has not had any changes in speech headaches or visual disturbances or unintended movements i.e. seizures.  He does have at least once a week an episode of hemoptysis, and short of breath at rest and certainly with exertion though he has been quite limited in his movement and cannot get to the bathroom without assistance.  No other complaints are verbalized palliative care is requested for goals of care discussion and for outpatient plans.  The patient is interested in hearing all of his options of treatment but is somewhat concerned about the possibility of chemotherapy.     PHYSICAL EXAM:  Wt Readings from Last 3  Encounters:  06/02/20 145 lb 15.1 oz (66.2 kg)  05/02/20 146 lb (66.2 kg)  01/30/20 154 lb (69.9 kg)   Temp Readings from Last 3 Encounters:  06/03/20 97.6 F (36.4 C) (Oral)  05/02/20 (!) 97.2 F (36.2 C) (Temporal)  01/30/20 (!) 97.2 F (36.2 C) (Oral)   BP Readings from Last 3 Encounters:  06/03/20 (!) 147/72  05/02/20 110/60  01/30/20 124/74   Pulse Readings from Last 3 Encounters:  06/03/20 88  05/02/20 92  01/30/20 78   Unable to assess given encounter type  ECOG = 3  0 - Asymptomatic (Fully active, able to carry on all predisease activities without restriction)  1 - Symptomatic but completely ambulatory (Restricted in physically strenuous activity but ambulatory and able to carry out work of a light or sedentary nature. For example, light housework, office work)  2 - Symptomatic, <50% in bed during the day (Ambulatory and capable of all self care but unable to carry out any work activities. Up and about more than 50% of waking hours)  3 - Symptomatic, >50% in bed, but not bedbound (Capable of only limited self-care, confined to bed or chair 50% or more of waking hours)  4 - Bedbound (Completely disabled. Cannot carry on any self-care. Totally confined to bed or chair)  5 - Death   Eustace Pen MM, Creech RH, Tormey DC, et al. [phone]). "Toxicity and response criteria of the [facility]". [place]. 5 (6): 649-55    LABORATORY DATA:  Lab Results  Component Value Date   WBC 12.2 (H) 06/03/2020   HGB 12.6 (L) 06/03/2020   HCT 37.9 (L) 06/03/2020   MCV 96.7 06/03/2020   PLT 261 06/03/2020   Lab Results  Component Value Date   NA 141 06/03/2020   K 3.8 06/03/2020   CL 102 06/03/2020   CO2 31 06/03/2020   Lab Results  Component Value Date   ALT 15 06/02/2020   AST 24 06/02/2020   ALKPHOS 117 06/02/2020   BILITOT 1.2 06/02/2020      RADIOGRAPHY: CT  HEAD WO CONTRAST  Result Date: 06/02/2020 CLINICAL DATA:  [place].  History of  metastatic lung cancer. EXAM: CT HEAD WITHOUT CONTRAST TECHNIQUE: Contiguous axial images were obtained from the base of the skull through the vertex without intravenous contrast. COMPARISON:  PET scan 05/21/2020 FINDINGS: Brain: No focal abnormality seen affecting the brainstem or cerebellum. There is a 1 cm mass at the inferior frontal lobe on the right with surrounding edema. There is a necrotic mass in the right frontal lobe measuring 3.3 cm in diameter, with surrounding edema, consistent with a metastasis. There is a necrotic mass at the right parietal vertex measuring up to 2.8 cm in diameter consistent with a metastasis. Some surrounding edema in this location as well. No metastasis to the left hemisphere identified. No hydrocephalus. Right-to-left shift of 2 mm. No extra-axial collection. Vascular: There is atherosclerotic calcification of the major vessels at the base of the brain. Skull: No fracture. No evidence skull or skull base metastatic lesion. Sinuses/Orbits: Mucosal inflammatory changes of the left maxillary sinus and left ethmoid sinuses. Orbits negative. Other: None IMPRESSION: 1 cm solid mass in the inferior frontal lobe on the right with mild edema. Two necrotic masses in the right hemisphere, located in the right frontal lobe and right parietal vertex consistent with metastases. Surrounding edema. 2 mm of right-to-left shift. No hydrocephalus. No traumatic finding. Non necrotic portions of the metastases are slightly hyperdense, likely indicating the presence of petechial hemorrhage. This hemorrhage probably relates to the biology of the tumor rather than the recent fall. Electronically Signed   By: Nelson Chimes M.D.   On: 06/02/2020 22:23   CT Angio Chest W/Cm &/Or Wo Cm  Result Date: 05/06/2020 CLINICAL DATA:  Positive D-dimer and shortness of breath, mass on recent chest x-ray with suggestion of central lymphadenopathy. EXAM: CT ANGIOGRAPHY CHEST WITH CONTRAST TECHNIQUE: Multidetector  CT imaging of the chest was performed using the standard protocol during bolus administration of intravenous contrast. Multiplanar CT image reconstructions and MIPs were obtained to evaluate the vascular anatomy. CONTRAST:  173mL OMNIPAQUE IOHEXOL 350 MG/ML SOLN COMPARISON:  05/02/2020 FINDINGS: Cardiovascular: Thoracic aorta demonstrates atherosclerotic calcifications. No aneurysmal dilatation or dissection is noted. Scattered coronary calcifications are identified. The pulmonary artery is well visualized without evidence of pulmonary emboli. No cardiac enlargement is noted. Mediastinum/Nodes: Significant mediastinal adenopathy is identified. There are multiple right paratracheal lymph nodes identified. The largest conglomeration of adenopathy measures 5.0 x 3.8 cm. Prevascular node is noted on image number 28 of series 4 measuring 13 mm in short axis. Precarinal adenopathy measures 3.7 x 3.6 cm and appears contiguous with the right paratracheal adenopathy mass. AP window lymph node on image number 40 of series 4 measures 13 mm in short axis. Right hilar adenopathy measuring 3.6 x 3.6 cm is noted on image number 52. On the same image there is evidence of left hilar lymph node measuring almost 13 mm in short axis. Subcarinal adenopathy is noted which measures at least 15 mm in short axis. Is difficult to discern the esophagus from the adjacent adenopathy which may be result in some under evaluation of size of subcarinal adenopathy. Fullness in the right thoracic inlet is noted portion of which is consistent with adenopathy measuring at least 15 mm in short axis on image number 5 of series 4. The timing of the contrast bolus somewhat limits the evaluation of cervical adenopathy at this time. Lungs/Pleura: Lungs are well aerated and demonstrate diffuse emphysematous changes consistent with the given clinical history.  In the right upper lobe there is a multilobulated mass lesion which measures 2.8 cm in greatest  transverse diameter and 2.1 cm in greatest AP diameter. It measures approximately 1.4 cm in craniocaudad projection. Scattered smaller subcentimeter nodules are noted throughout the right lung. The largest of these is noted at the junction of the minor and major fissures best seen on image number 87 of series 6 measuring 8.6 mm. Smaller nodules are seen on images 51, 61, 63, 96, 99, 110, 118 and 125 of series 6. In the left lung, multiple nodules are noted. The largest of these measures 12.4 mm best seen on image number 33 of series 6. Scattered smaller nodules are noted on images 17, 21, 27, 40, 49, 60 , 67 and 81 of series 6. Additionally, there is a pleural based nodule which measures 2.2 x 1.0 cm best seen on image number 60 of series 6. Upper Abdomen: Visualized upper abdomen is within normal limits. No definitive mass is seen. No discrete sizable adenopathy is identified at this time. Musculoskeletal: No chest wall abnormality. No acute or significant osseous findings. Review of the MIP images confirms the above findings. IMPRESSION: Dominant mass lesion in the right upper lobe with multiple parenchymal nodules bilaterally and significant mediastinal and hilar adenopathy bilaterally. 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 No evidence of pulmonary emboli. Aortic Atherosclerosis (ICD10-I70.0) and Emphysema (ICD10-J43.9). Critical Value/emergent results were called by telephone at the time of interpretation on 05/06/2020 at 4:51 pm to [facility], NP , who verbally acknowledged these results. She states she is had conversations with the patient with regards to this diagnosis. Further workup can be determined as clinically indicated but could include PET-CT imaging and tissue diagnosis Electronically Signed   By: Inez Catalina M.D.   On: 05/06/2020 16:59    NM PET Image Initial (PI) Skull Base To Thigh  Result Date: 05/21/2020 CLINICAL DATA:  Initial treatment strategy for bilateral pulmonary nodules and bulky thoracic adenopathy on recent chest CT. EXAM: NUCLEAR MEDICINE PET SKULL BASE TO THIGH TECHNIQUE: 7.4 mCi F-18 FDG was injected intravenously. Full-ring PET imaging was performed from the skull base to thigh after the radiotracer. CT data was obtained and used for attenuation correction and anatomic localization. Fasting blood glucose: 244 mg/dl COMPARISON:  05/06/2020 chest CT angiogram. FINDINGS: Mediastinal blood pool activity: SUV max 2.3 Liver activity: SUV max NA NECK: Multiple enlarged hypermetabolic lymph nodes throughout the right greater than left neck involving levels II, III, IV and V on the right and level I on the left. Representative 1.4 cm left level Ib node with max SUV 4.8 near the left angle of mandible (series 4/image 33). Representative 1.7 cm right level IIb lymph node with max SUV 5.5 (series 4/image 32). Representative 1.8 cm right level IV neck lymph node with max SUV 4.5 (series 4/image 41). Representative 1.7 cm right level V neck lymph node with max SUV 5.2 (series 4/image 39). Incidental CT findings: Mucoperiosteal thickening throughout the left greater than right maxillary sinuses and ethmoidal air cells, with no fluid levels. CHEST: Hypermetabolic irregular 3.3 cm anterior right upper lobe lung mass with max SUV 4.5 (series 8/image 20). Numerous (greater than 10) irregular solid pulmonary nodules scattered throughout both lungs, the largest of which demonstrate mild hypermetabolism with representative peripheral left upper lobe 2.2 cm nodule with max SUV 2.7 (series 8/image 33) and  representative 0.4 cm peripheral left upper lobe nodule with max SUV 2.0 (series 8/image 19). Mildly enlarged hypermetabolic 1.1 cm left axillary lymph node with max SUV 2.7 (series 4/image 54). No hypermetabolic right axillary nodes. Bulky  hypermetabolic bilateral mediastinal adenopathy involving prevascular, AP window, paratracheal and subcarinal chains. Representative 1.6 cm left prevascular node with max SUV 4.5 (series 4/image 60). Representative 1.6 cm right prevascular node with max SUV 5.2 (series 4/image 69). Representative 4.9 cm right paratracheal node with max SUV 4.3 (series 4/image 63). Representative 1.8 cm subcarinal node with max SUV 4.6 (series 4/image 81). Hypermetabolic 2.6 cm right hilar node with max SUV 4.4 (series 4/image 78). No hypermetabolic left hilar nodes. Incidental CT findings: Coronary atherosclerosis. Atherosclerotic nonaneurysmal thoracic aorta. Severe centrilobular emphysema. ABDOMEN/PELVIS: Hypermetabolic 1.9 cm left adrenal nodule with density 21 HU and max SUV 3.0. New hypermetabolic right adrenal nodules. No abnormal hypermetabolic activity within the liver, pancreas or spleen. No hypermetabolic lymph nodes in the abdomen or pelvis. Incidental CT findings: Nonobstructing 7 mm upper left renal stone. Simple 3.4 cm lower left renal cyst. Atherosclerotic nonaneurysmal abdominal aorta. SKELETON: Hypermetabolic midline sacral lesion with max SUV 4.2 and hypermetabolic L2 vertebral lesion with max SUV 5.1, relatively CT occult. Incidental CT findings: none IMPRESSION: 1. Hypermetabolic irregular 3.3 cm anterior right upper lobe lung mass, compatible with primary bronchogenic carcinoma. 2. Numerous (greater than 10) irregular solid bilateral pulmonary nodules, the largest of which are mildly hypermetabolic, compatible with metastatic disease. 3. Bulky hypermetabolic ipsilateral hilar, bilateral mediastinal and bilateral neck nodal metastases. 4. Hypermetabolic left adrenal metastasis. 5. 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 6. 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 7. Chronic findings include: Aortic Atherosclerosis (ICD10-I70.0) and Emphysema (ICD10-J43.9). Coronary atherosclerosis.  Nonobstructing left nephrolithiasis. Chronic appearing bilateral paranasal sinusitis. Electronically Signed   By: Ilona Sorrel M.D.   On: 05/21/2020 14:06   DG Chest Portable 1 View  Result Date: 06/02/2020 CLINICAL DATA:  Shortness of breath.  COPD.  Lung cancer suspected. EXAM: PORTABLE CHEST 1 VIEW COMPARISON:  05/02/2020 radiography.  PET scan 05/21/2020 FINDINGS: Heart size is normal. Right hilar adenopathy and right paratracheal adenopathy have worsened. Mass lesion in the right upper lobe consistent with a malignancy shown by PET scan. 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 No consolidation, collapse or effusion. No focal bone lesion. IMPRESSION: Worsened right hilar and right paratracheal adenopathy. Mass lesion in the right upper lobe consistent with a malignancy shown by PET scan. 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 No other acute process. Electronically Signed   By: Nelson Chimes M.D.   On: 06/02/2020 22:16       IMPRESSION/PLAN: 1. Probable metastatic lung cancer with brain metastases.  I will review his results with Dr. Lisbeth Renshaw but we did recommend proceeding with an MRI of the brain for further clarification of his disease status, he will be going for a biopsy of his cervical adenopathy tomorrow and hopefully clarity will occur for his tumor type.  I discussed that there are 2 approaches with radiation either stereotactic radiosurgery for patients who have less than 10 lesions versus whole brain for those who have greater than 10.  We will go through March more detail in our discussion once we have further clarity on screening study, they are aware that an additional MRI may be indicated if he is a candidate for [facility] for treatment  planning and confirming candidacy for this.  We also discussed his symptoms in his left lower extremity, I will follow-up  with Dr. Lisbeth Renshaw to see if he feels that either targets in the lumbar sacral spine would be of benefit to offer treatment as well as to the chest given his ongoing hemoptysis, especially if he declined systemic therapy.  The patient and his sister are in agreement. 2. Goals of care.  The patient is interested in talking further with palliative medicine and while he is not ready for hospice is interested in knowing what levels of support can also be utilized in the outpatient setting as well.  An order was placed for goals of care evaluation and to coordinate outpatient palliative care.  In a visit lasting 70 minutes, greater than 50% of the time was spent face to face discussing the patient's condition, in preparation for the discussion, and coordinating the patient's care.     Carola Rhine, [facility]   **Disclaimer: This note was dictated with voice recognition software. Similar sounding words can inadvertently be transcribed and this note may contain transcription errors which may not have been corrected upon publication of note.**

## 2020-06-03 NOTE — Consult Note (Addendum)
[place]  [phone] [phone]   MEDICAL ONCOLOGY - INITIAL CONSULTATION   Referral MD: Dr. Nita Sells  Reason for Referral: Lung mass with brain lesions  HPI: Mr. 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  The patient presented to the emergency room following a fall with hyperglycemia.  For several days prior to admission, the patient was having difficulty with ambulation.  He fell out of bed the night prior to admission due to left lower extremity weakness and laid on the ground for about 2 hours until he was able to get back up.  He also noted a blood sugar of 400-500 over the past few days prior to admission.   The patient had a CT of the head without contrast performed in the emergency room which showed a 1 cm solid mass in the inferior frontal lobe on the right with mild edema and 2 necrotic masses in the right hemisphere located in the right frontal lobe and the right parietal vertex consistent with metastases.  There was surrounding edema.  There was 2 mm of right to left shift and no hydrocephalus.  Patient has been started on dexamethasone.  Of note, the patient has been followed by pulmonology and had a chest x-ray performed 05/02/2020 which showed a right upper lobe 2.4 cm pulmonary nodule.  This was followed with a CTA chest on 05/06/2020 which showed a dominant mass lesion in the right upper lobe with multiple parenchymal nodules bilaterally and significant mediastinal and hilar adenopathy bilaterally-findings likely represent a primary pulmonary neoplasm with associated metastatic disease.  Additionally, the patient had a PET scan performed 05/21/2020 which showed a hypermetabolic irregular 3.3 cm anterior right upper lobe lung  mass compatible with primary bronchogenic carcinoma, numerous (greater than 10) irregular solid bilateral pulmonary nodules the largest of which are mildly hypermetabolic and compatible with metastatic disease, bulky hypermetabolic ipsilateral hilar, bilateral mediastinal, and bilateral neck nodal metastases, hypermetabolic left adrenal metastasis, hypermetabolic bone metastases to the lumbar spine and sacrum.  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  The patient was referred to interventional radiology for biopsy of one of his lymph nodes.  This is currently scheduled for next week.  I met with the patient in his hospital room.  His sister/HCPOA was at the bedside.  The patient tells me that he has had a poor appetite and has lost about 15 to 20 pounds over the past 1 to 2 months.  No fevers or chills reported.  He is not currently having any headaches or dizziness.  He reports a productive cough and previously had hemoptysis which has now resolved.  He has his baseline shortness of breath and some chest discomfort over his sternum.  He is not having any abdominal pain, nausea, vomiting, constipation, diarrhea.  No lower extremity edema reported.  The patient is single and he does not have any children.  He lives alone.  The patient reports that he previously drank alcohol but has not had any alcohol recently.  He previously smoked about 1/2 pack cigarettes per day.  Family history significant for a father with leukemia.  Ankle oncology was asked see the patient make recommendations regarding his lung mass and brain lesions.   Past Medical History:  Diagnosis Date  . Atrial fibrillation ([place])   .  Chronic kidney disease   . COPD (chronic obstructive pulmonary disease) ([place])   . Hypertension   . Type 2 diabetes mellitus ([place])   . Vitiligo   :  Past Surgical History:  Procedure Laterality Date  . MELANOMA EXCISION    :  Current Facility-Administered Medications  Medication Dose Route Frequency  Provider Last Rate Last Admin  . allopurinol (ZYLOPRIM) tablet 300 mg  300 mg Oral Daily Tu, Ching T, DO   300 mg at 06/03/20 1202  . chlorhexidine (PERIDEX) 0.12 % solution 15 mL  15 mL Mouth Rinse BID Curt Bears, MD   15 mL at 06/03/20 1224  . Chlorhexidine Gluconate Cloth 2 % PADS 6 each  6 each Topical Daily Curt Bears, MD   6 each at 06/03/20 1000  . dexamethasone (DECADRON) injection 10 mg  10 mg Intravenous Daily Tu, Ching T, DO   10 mg at 06/03/20 1203  . dextrose 5 % in lactated ringers infusion   Intravenous Continuous Noemi Chapel, MD 125 mL/hr at 06/03/20 0144 New Bag at 06/03/20 0144  . dextrose 50 % solution 0-50 mL  0-50 mL Intravenous PRN Noemi Chapel, MD      . diltiazem (CARDIZEM) tablet 120 mg  120 mg Oral Daily Tu, Ching T, DO   120 mg at 06/03/20 1202  . flecainide (TAMBOCOR) tablet 200 mg  200 mg Oral BID Tu, Ching T, DO   200 mg at 06/03/20 1223  . fluticasone furoate-vilanterol (BREO ELLIPTA) 100-25 MCG/INH 1 puff  1 puff Inhalation Daily Tu, Ching T, DO      . guaiFENesin (MUCINEX) 12 hr tablet 1,200 mg  1,200 mg Oral BID Tu, Ching T, DO   1,200 mg at 06/03/20 1201  . hydrochlorothiazide (MICROZIDE) capsule 12.5 mg  12.5 mg Oral Daily Tu, Ching T, DO   12.5 mg at 06/03/20 1154  . hydroxychloroquine (PLAQUENIL) tablet 200 mg  200 mg Oral Daily Tu, Ching T, DO   200 mg at 06/03/20 1154  . insulin aspart (novoLOG) injection 0-9 Units  0-9 Units Subcutaneous TID WC Samtani, Jai-Gurmukh, MD      . insulin aspart (novoLOG) injection 3 Units  3 Units Subcutaneous TID WC Samtani, Jai-Gurmukh, MD      . insulin glargine (LANTUS) injection 15 Units  15 Units Subcutaneous Daily Samtani, Jai-Gurmukh, MD      . insulin pump   Subcutaneous TID WC, HS, 0200 Samtani, Jai-Gurmukh, MD      . insulin regular, human (MYXREDLIN) 100 units/ 100 mL infusion   Intravenous Continuous Noemi Chapel, MD 2.2 mL/hr at 06/03/20 0643 2.2 Units/hr at 06/03/20 0643  . levothyroxine  (SYNTHROID) tablet 88 mcg  88 mcg Oral Q0600 Tu, Ching T, DO   88 mcg at 06/03/20 0705  . MEDLINE mouth rinse  15 mL Mouth Rinse q12n4p Curt Bears, MD   15 mL at 06/03/20 1225  . potassium chloride SA (KLOR-CON) CR tablet 20 mEq  20 mEq Oral Daily Tu, Ching T, DO   20 mEq at 06/03/20 1202  . temazepam (RESTORIL) capsule 30 mg  30 mg Oral QHS PRN Tu, Ching T, DO   30 mg at 06/03/20 0136  . torsemide (DEMADEX) tablet 10 mg  10 mg Oral Daily Tu, Ching T, DO   10 mg at 06/03/20 1221  . umeclidinium bromide (INCRUSE ELLIPTA) 62.5 MCG/INH 1 puff  1 puff Inhalation Daily Tu, Ching T, DO         Allergies  Allergen  Reactions  . Mefloquine Hcl Nausea And Vomiting    Altered mental status   :  Family History  Problem Relation Age of Onset  . Stroke Mother   . Cancer Father   :  Social History   Socioeconomic History  . Marital status: Single    Spouse name: Not on file  . Number of children: Not on file  . Years of education: Not on file  . Highest education level: Not on file  Occupational History  . Not on file  Tobacco Use  . Smoking status: Former Smoker    Packs/day: 1.50    Years: 25.00    Pack years: 55.50    Quit date: 04/20/1988    Years since quitting: 32.1  . Smokeless tobacco: Never Used  Vaping Use  . Vaping Use: Never used  Substance and Sexual Activity  . Alcohol use: Yes  . Drug use: Not on file  . Sexual activity: Not on file  Other Topics Concern  . Not on file  Social History Narrative  . Not on file   Social Determinants of Health   Financial Resource Strain: Not on file  Food Insecurity: Not on file  Transportation Needs: Not on file  Physical Activity: Not on file  Stress: Not on file  Social Connections: Not on file  Intimate Partner Violence: Not on file  :  Review of Systems: A comprehensive 14 point review of systems was negative except as noted in the HPI.  Exam: Patient Vitals for the past 24 hrs:  BP Temp Temp src Pulse Resp SpO2  Height Weight  06/03/20 1202 (!) 147/72 -- -- -- -- -- -- --  06/03/20 0900 (!) 147/115 -- -- 88 (!) 21 93 % -- --  06/03/20 0840 (!) 143/66 97.6 F (36.4 C) Oral -- 17 92 % $Re'5\' 10"'sRe$  (1.778 m) --  06/03/20 0800 133/88 -- -- 89 (!) 21 (!) 89 % -- --  06/03/20 0745 -- -- -- 92 19 94 % -- --  06/03/20 0730 134/74 -- -- 97 (!) 21 (!) 88 % -- --  06/03/20 0715 -- -- -- (!) 101 (!) 23 95 % -- --  06/03/20 0700 138/82 -- -- 98 19 99 % -- --  06/03/20 0645 -- -- -- 92 (!) 21 94 % -- --  06/03/20 0630 129/69 -- -- 89 20 98 % -- --  06/03/20 0615 -- -- -- 88 19 100 % -- --  06/03/20 0600 128/73 -- -- 88 17 100 % -- --  06/03/20 0545 -- -- -- 84 18 100 % -- --  06/03/20 0530 117/67 -- -- 85 18 99 % -- --  06/03/20 0515 -- -- -- 88 19 100 % -- --  06/03/20 0500 130/72 -- -- 89 16 100 % -- --  06/03/20 0445 -- -- -- 84 16 99 % -- --  06/03/20 0430 127/68 -- -- 87 19 96 % -- --  06/03/20 0415 -- -- -- 89 15 95 % -- --  06/03/20 0400 127/72 -- -- 89 18 96 % -- --  06/03/20 0300 131/68 -- -- 88 19 100 % -- --  06/03/20 0200 126/67 -- -- 94 18 95 % -- --  06/03/20 0130 136/63 -- -- 99 15 95 % -- --  06/03/20 0021 130/68 98.3 F (36.8 C) Oral (!) 103 17 97 % -- --  06/02/20 2150 (!) 153/72 98.1 F (36.7 C) Oral (!) 101 16 100 % -- --  06/02/20 2146 -- -- -- -- -- -- 5\' 10"  (1.778 m) 66.2 kg    General: Chronically ill-appearing male,, cachectic, no distress Eyes: EOMI, PERRL, no scleral icterus.   ENT: Oral mucosa is dry, no thrush or mucositis   Lymphatics: Palpable cervical adenopathy bilaterally Respiratory: Diminished breath sounds bilaterally Cardiovascular:  Regular rate and rhythm, S1/S2, without murmur, rub or gallop.  There was no pedal edema.   GI:  abdomen was soft, flat, nontender, nondistended, without organomegaly.   Musculoskeletal:  no spinal tenderness of palpation of vertebral spine.   Skin exam was without echymosis, petichae.   Neuro exam was nonfocal. Patient was alert and  oriented.  Attention was good.   Language was appropriate.  Mood was normal without depression.  Speech was not pressured.  Thought content was not tangential.     Lab Results  Component Value Date   WBC 12.2 (H) 06/03/2020   HGB 12.6 (L) 06/03/2020   HCT 37.9 (L) 06/03/2020   PLT 261 06/03/2020   GLUCOSE 211 (H) 06/03/2020   ALT 15 06/02/2020   AST 24 06/02/2020   NA 141 06/03/2020   K 3.8 06/03/2020   CL 102 06/03/2020   CREATININE 0.68 06/03/2020   BUN 30 (H) 06/03/2020   CO2 31 06/03/2020    CT HEAD WO CONTRAST  Result Date: 06/02/2020 CLINICAL DATA:  06/04/2020.  History of metastatic lung cancer. EXAM: CT HEAD WITHOUT CONTRAST TECHNIQUE: Contiguous axial images were obtained from the base of the skull through the vertex without intravenous contrast. COMPARISON:  PET scan 05/21/2020 FINDINGS: Brain: No focal abnormality seen affecting the brainstem or cerebellum. There is a 1 cm mass at the inferior frontal lobe on the right with surrounding edema. There is a necrotic mass in the right frontal lobe measuring 3.3 cm in diameter, with surrounding edema, consistent with a metastasis. There is a necrotic mass at the right parietal vertex measuring up to 2.8 cm in diameter consistent with a metastasis. Some surrounding edema in this location as well. No metastasis to the left hemisphere identified. No hydrocephalus. Right-to-left shift of 2 mm. No extra-axial collection. Vascular: There is atherosclerotic calcification of the major vessels at the base of the brain. Skull: No fracture. No evidence skull or skull base metastatic lesion. Sinuses/Orbits: Mucosal inflammatory changes of the left maxillary sinus and left ethmoid sinuses. Orbits negative. Other: None IMPRESSION: 1 cm solid mass in the inferior frontal lobe on the right with mild edema. Two necrotic masses in the right hemisphere, located in the right frontal lobe and right parietal vertex consistent with metastases. Surrounding edema. 2 mm  of right-to-left shift. No hydrocephalus. No traumatic finding. Non necrotic portions of the metastases are slightly hyperdense, likely indicating the presence of petechial hemorrhage. This hemorrhage probably relates to the biology of the tumor rather than the recent fall. Electronically Signed   By: 07/19/2020 M.D.   On: 06/02/2020 22:23   CT Angio Chest W/Cm &/Or Wo Cm  Result Date: 05/06/2020 CLINICAL DATA:  Positive D-dimer and shortness of breath, mass on recent chest x-ray with suggestion of central lymphadenopathy. EXAM: CT ANGIOGRAPHY CHEST WITH CONTRAST TECHNIQUE: Multidetector CT imaging of the chest was performed using the standard protocol during bolus administration of intravenous contrast. Multiplanar CT image reconstructions and MIPs were obtained to evaluate the vascular anatomy. CONTRAST:  05/08/2020 OMNIPAQUE IOHEXOL 350 MG/ML SOLN COMPARISON:  05/02/2020 FINDINGS: Cardiovascular: Thoracic aorta demonstrates atherosclerotic calcifications. No aneurysmal dilatation or dissection is noted. Scattered coronary  calcifications are identified. The pulmonary artery is well visualized without evidence of pulmonary emboli. No cardiac enlargement is noted. Mediastinum/Nodes: Significant mediastinal adenopathy is identified. There are multiple right paratracheal lymph nodes identified. The largest conglomeration of adenopathy measures 5.0 x 3.8 cm. Prevascular node is noted on image number 28 of series 4 measuring 13 mm in short axis. Precarinal adenopathy measures 3.7 x 3.6 cm and appears contiguous with the right paratracheal adenopathy mass. AP window lymph node on image number 40 of series 4 measures 13 mm in short axis. Right hilar adenopathy measuring 3.6 x 3.6 cm is noted on image number 52. On the same image there is evidence of left hilar lymph node measuring almost 13 mm in short axis. Subcarinal adenopathy is noted which measures at least 15 mm in short axis. Is difficult to discern the esophagus  from the adjacent adenopathy which may be result in some under evaluation of size of subcarinal adenopathy. Fullness in the right thoracic inlet is noted portion of which is consistent with adenopathy measuring at least 15 mm in short axis on image number 5 of series 4. The timing of the contrast bolus somewhat limits the evaluation of cervical adenopathy at this time. Lungs/Pleura: Lungs are well aerated and demonstrate diffuse emphysematous changes consistent with the given clinical history. In the right upper lobe there is a multilobulated mass lesion which measures 2.8 cm in greatest transverse diameter and 2.1 cm in greatest AP diameter. It measures approximately 1.4 cm in craniocaudad projection. Scattered smaller subcentimeter nodules are noted throughout the right lung. The largest of these is noted at the junction of the minor and major fissures best seen on image number 87 of series 6 measuring 8.6 mm. Smaller nodules are seen on images 51, 61, 63, 96, 99, 110, 118 and 125 of series 6. In the left lung, multiple nodules are noted. The largest of these measures 12.4 mm best seen on image number 33 of series 6. Scattered smaller nodules are noted on images 17, 21, 27, 40, 49, 60 , 67 and 81 of series 6. Additionally, there is a pleural based nodule which measures 2.2 x 1.0 cm best seen on image number 60 of series 6. Upper Abdomen: Visualized upper abdomen is within normal limits. No definitive mass is seen. No discrete sizable adenopathy is identified at this time. Musculoskeletal: No chest wall abnormality. No acute or significant osseous findings. Review of the MIP images confirms the above findings. IMPRESSION: Dominant mass lesion in the right upper lobe with multiple parenchymal nodules bilaterally and significant mediastinal and hilar adenopathy bilaterally. This likely represents a primary pulmonary neoplasm with associated metastatic disease although the possibility of diffuse metastatic disease  from a remote site would deserve consideration as well. No evidence of pulmonary emboli. Aortic Atherosclerosis (ICD10-I70.0) and Emphysema (ICD10-J43.9). Critical Value/emergent results were called by telephone at the time of interpretation on 05/06/2020 at 4:51 pm to [facility], NP , who verbally acknowledged these results. She states she is had conversations with the patient with regards to this diagnosis. Further workup can be determined as clinically indicated but could include PET-CT imaging and tissue diagnosis Electronically Signed   By: Inez Catalina M.D.   On: 05/06/2020 16:59   NM PET Image Initial (PI) Skull Base To Thigh  Result Date: 05/21/2020 CLINICAL DATA:  Initial treatment strategy for bilateral pulmonary nodules and bulky thoracic adenopathy on recent chest CT. EXAM: NUCLEAR MEDICINE PET SKULL BASE TO THIGH TECHNIQUE: 7.4 mCi F-18 FDG  was injected intravenously. Full-ring PET imaging was performed from the skull base to thigh after the radiotracer. CT data was obtained and used for attenuation correction and anatomic localization. Fasting blood glucose: 244 mg/dl COMPARISON:  62/37/6283 chest CT angiogram. FINDINGS: Mediastinal blood pool activity: SUV max 2.3 Liver activity: SUV max NA NECK: Multiple enlarged hypermetabolic lymph nodes throughout the right greater than left neck involving levels II, III, IV and V on the right and level I on the left. Representative 1.4 cm left level Ib node with max SUV 4.8 near the left angle of mandible (series 4/image 33). Representative 1.7 cm right level IIb lymph node with max SUV 5.5 (series 4/image 32). Representative 1.8 cm right level IV neck lymph node with max SUV 4.5 (series 4/image 41). Representative 1.7 cm right level V neck lymph node with max SUV 5.2 (series 4/image 39). Incidental CT findings: Mucoperiosteal thickening throughout the left greater than right maxillary sinuses and ethmoidal air cells, with no fluid levels. CHEST:  Hypermetabolic irregular 3.3 cm anterior right upper lobe lung mass with max SUV 4.5 (series 8/image 20). Numerous (greater than 10) irregular solid pulmonary nodules scattered throughout both lungs, the largest of which demonstrate mild hypermetabolism with representative peripheral left upper lobe 2.2 cm nodule with max SUV 2.7 (series 8/image 33) and representative 0.4 cm peripheral left upper lobe nodule with max SUV 2.0 (series 8/image 19). Mildly enlarged hypermetabolic 1.1 cm left axillary lymph node with max SUV 2.7 (series 4/image 54). No hypermetabolic right axillary nodes. Bulky hypermetabolic bilateral mediastinal adenopathy involving prevascular, AP window, paratracheal and subcarinal chains. Representative 1.6 cm left prevascular node with max SUV 4.5 (series 4/image 60). Representative 1.6 cm right prevascular node with max SUV 5.2 (series 4/image 69). Representative 4.9 cm right paratracheal node with max SUV 4.3 (series 4/image 63). Representative 1.8 cm subcarinal node with max SUV 4.6 (series 4/image 81). Hypermetabolic 2.6 cm right hilar node with max SUV 4.4 (series 4/image 78). No hypermetabolic left hilar nodes. Incidental CT findings: Coronary atherosclerosis. Atherosclerotic nonaneurysmal thoracic aorta. Severe centrilobular emphysema. ABDOMEN/PELVIS: Hypermetabolic 1.9 cm left adrenal nodule with density 21 HU and max SUV 3.0. New hypermetabolic right adrenal nodules. No abnormal hypermetabolic activity within the liver, pancreas or spleen. No hypermetabolic lymph nodes in the abdomen or pelvis. Incidental CT findings: Nonobstructing 7 mm upper left renal stone. Simple 3.4 cm lower left renal cyst. Atherosclerotic nonaneurysmal abdominal aorta. SKELETON: Hypermetabolic midline sacral lesion with max SUV 4.2 and hypermetabolic L2 vertebral lesion with max SUV 5.1, relatively CT occult. Incidental CT findings: none IMPRESSION: 1. Hypermetabolic irregular 3.3 cm anterior right upper lobe lung  mass, compatible with primary bronchogenic carcinoma. 2. Numerous (greater than 10) irregular solid bilateral pulmonary nodules, the largest of which are mildly hypermetabolic, compatible with metastatic disease. 3. Bulky hypermetabolic ipsilateral hilar, bilateral mediastinal and bilateral neck nodal metastases. 4. Hypermetabolic left adrenal metastasis. 5. 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 6. 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 7. Chronic findings include: Aortic Atherosclerosis (ICD10-I70.0) and Emphysema (ICD10-J43.9). Coronary atherosclerosis. Nonobstructing left nephrolithiasis. Chronic appearing bilateral paranasal sinusitis. Electronically Signed   By: Delbert Phenix M.D.   On: 05/21/2020 14:06   DG Chest Portable 1 View  Result Date: 06/02/2020 CLINICAL DATA:  Shortness of breath.  COPD.  Lung cancer suspected. EXAM: PORTABLE CHEST 1 VIEW COMPARISON:  05/02/2020 radiography.  PET scan 05/21/2020 FINDINGS: Heart size is normal. Right hilar adenopathy and right paratracheal adenopathy have worsened. Mass  lesion in the right upper lobe consistent with a malignancy shown by PET scan. 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 No consolidation, collapse or effusion. No focal bone lesion. IMPRESSION: Worsened right hilar and right paratracheal adenopathy. Mass lesion in the right upper lobe consistent with a malignancy shown by PET scan. 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 No other acute process. Electronically Signed   By: Nelson Chimes M.D.   On: 06/02/2020 22:16     CT HEAD WO CONTRAST  Result Date: 06/02/2020 CLINICAL DATA:  [place].  History of metastatic lung cancer. EXAM: CT HEAD WITHOUT CONTRAST TECHNIQUE: Contiguous axial images were obtained from the base of the skull through the vertex without intravenous contrast.  COMPARISON:  PET scan 05/21/2020 FINDINGS: Brain: No focal abnormality seen affecting the brainstem or cerebellum. There is a 1 cm mass at the inferior frontal lobe on the right with surrounding edema. There is a necrotic mass in the right frontal lobe measuring 3.3 cm in diameter, with surrounding edema, consistent with a metastasis. There is a necrotic mass at the right parietal vertex measuring up to 2.8 cm in diameter consistent with a metastasis. Some surrounding edema in this location as well. No metastasis to the left hemisphere identified. No hydrocephalus. Right-to-left shift of 2 mm. No extra-axial collection. Vascular: There is atherosclerotic calcification of the major vessels at the base of the brain. Skull: No fracture. No evidence skull or skull base metastatic lesion. Sinuses/Orbits: Mucosal inflammatory changes of the left maxillary sinus and left ethmoid sinuses. Orbits negative. Other: None IMPRESSION: 1 cm solid mass in the inferior frontal lobe on the right with mild edema. Two necrotic masses in the right hemisphere, located in the right frontal lobe and right parietal vertex consistent with metastases. Surrounding edema. 2 mm of right-to-left shift. No hydrocephalus. No traumatic finding. Non necrotic portions of the metastases are slightly hyperdense, likely indicating the presence of petechial hemorrhage. This hemorrhage probably relates to the biology of the tumor rather than the recent fall. Electronically Signed   By: Nelson Chimes M.D.   On: 06/02/2020 22:23   CT Angio Chest W/Cm &/Or Wo Cm  Result Date: 05/06/2020 CLINICAL DATA:  Positive D-dimer and shortness of breath, mass on recent chest x-ray with suggestion of central lymphadenopathy. EXAM: CT ANGIOGRAPHY CHEST WITH CONTRAST TECHNIQUE: Multidetector CT imaging of the chest was performed using the standard protocol during bolus administration of intravenous contrast. Multiplanar CT image reconstructions and MIPs were obtained to  evaluate the vascular anatomy. CONTRAST:  12mL OMNIPAQUE IOHEXOL 350 MG/ML SOLN COMPARISON:  05/02/2020 FINDINGS: Cardiovascular: Thoracic aorta demonstrates atherosclerotic calcifications. No aneurysmal dilatation or dissection is noted. Scattered coronary calcifications are identified. The pulmonary artery is well visualized without evidence of pulmonary emboli. No cardiac enlargement is noted. Mediastinum/Nodes: Significant mediastinal adenopathy is identified. There are multiple right paratracheal lymph nodes identified. The largest conglomeration of adenopathy measures 5.0 x 3.8 cm. Prevascular node is noted on image number 28 of series 4 measuring 13 mm in short axis. Precarinal adenopathy measures 3.7 x 3.6 cm and appears contiguous with the right paratracheal adenopathy mass. AP window lymph node on image number 40 of series 4 measures 13 mm in short axis. Right hilar adenopathy measuring 3.6 x 3.6 cm is noted on image number 52. On the same image there is evidence of left hilar lymph node measuring almost 13 mm in short axis. Subcarinal  adenopathy is noted which measures at least 15 mm in short axis. Is difficult to discern the esophagus from the adjacent adenopathy which may be result in some under evaluation of size of subcarinal adenopathy. Fullness in the right thoracic inlet is noted portion of which is consistent with adenopathy measuring at least 15 mm in short axis on image number 5 of series 4. The timing of the contrast bolus somewhat limits the evaluation of cervical adenopathy at this time. Lungs/Pleura: Lungs are well aerated and demonstrate diffuse emphysematous changes consistent with the given clinical history. In the right upper lobe there is a multilobulated mass lesion which measures 2.8 cm in greatest transverse diameter and 2.1 cm in greatest AP diameter. It measures approximately 1.4 cm in craniocaudad projection. Scattered smaller subcentimeter nodules are noted throughout the right  lung. The largest of these is noted at the junction of the minor and major fissures best seen on image number 87 of series 6 measuring 8.6 mm. Smaller nodules are seen on images 51, 61, 63, 96, 99, 110, 118 and 125 of series 6. In the left lung, multiple nodules are noted. The largest of these measures 12.4 mm best seen on image number 33 of series 6. Scattered smaller nodules are noted on images 17, 21, 27, 40, 49, 60 , 67 and 81 of series 6. Additionally, there is a pleural based nodule which measures 2.2 x 1.0 cm best seen on image number 60 of series 6. Upper Abdomen: Visualized upper abdomen is within normal limits. No definitive mass is seen. No discrete sizable adenopathy is identified at this time. Musculoskeletal: No chest wall abnormality. No acute or significant osseous findings. Review of the MIP images confirms the above findings. IMPRESSION: Dominant mass lesion in the right upper lobe with multiple parenchymal nodules bilaterally and significant mediastinal and hilar adenopathy bilaterally. 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 No evidence of pulmonary emboli. Aortic Atherosclerosis (ICD10-I70.0) and Emphysema (ICD10-J43.9). Critical Value/emergent results were called by telephone at the time of interpretation on 05/06/2020 at 4:51 pm to [facility], NP , who verbally acknowledged these results. She states she is had conversations with the patient with regards to this diagnosis. Further workup can be determined as clinically indicated but could include PET-CT imaging and tissue diagnosis Electronically Signed   By: Inez Catalina M.D.   On: 05/06/2020 16:59   NM PET Image Initial (PI) Skull Base To Thigh  Result Date: 05/21/2020 CLINICAL DATA:  Initial treatment strategy for bilateral pulmonary nodules and bulky thoracic adenopathy on  recent chest CT. EXAM: NUCLEAR MEDICINE PET SKULL BASE TO THIGH TECHNIQUE: 7.4 mCi F-18 FDG was injected intravenously. Full-ring PET imaging was performed from the skull base to thigh after the radiotracer. CT data was obtained and used for attenuation correction and anatomic localization. Fasting blood glucose: 244 mg/dl COMPARISON:  05/06/2020 chest CT angiogram. FINDINGS: Mediastinal blood pool activity: SUV max 2.3 Liver activity: SUV max NA NECK: Multiple enlarged hypermetabolic lymph nodes throughout the right greater than left neck involving levels II, III, IV and V on the right and level I on the left. Representative 1.4 cm left level Ib node with max SUV 4.8 near the left angle of mandible (series 4/image 33). Representative 1.7 cm right level IIb lymph node with max SUV 5.5 (series 4/image 32). Representative 1.8 cm right level IV neck lymph node with  max SUV 4.5 (series 4/image 41). Representative 1.7 cm right level V neck lymph node with max SUV 5.2 (series 4/image 39). Incidental CT findings: Mucoperiosteal thickening throughout the left greater than right maxillary sinuses and ethmoidal air cells, with no fluid levels. CHEST: Hypermetabolic irregular 3.3 cm anterior right upper lobe lung mass with max SUV 4.5 (series 8/image 20). Numerous (greater than 10) irregular solid pulmonary nodules scattered throughout both lungs, the largest of which demonstrate mild hypermetabolism with representative peripheral left upper lobe 2.2 cm nodule with max SUV 2.7 (series 8/image 33) and representative 0.4 cm peripheral left upper lobe nodule with max SUV 2.0 (series 8/image 19). Mildly enlarged hypermetabolic 1.1 cm left axillary lymph node with max SUV 2.7 (series 4/image 54). No hypermetabolic right axillary nodes. Bulky hypermetabolic bilateral mediastinal adenopathy involving prevascular, AP window, paratracheal and subcarinal chains. Representative 1.6 cm left prevascular node with max SUV 4.5 (series  4/image 60). Representative 1.6 cm right prevascular node with max SUV 5.2 (series 4/image 69). Representative 4.9 cm right paratracheal node with max SUV 4.3 (series 4/image 63). Representative 1.8 cm subcarinal node with max SUV 4.6 (series 4/image 81). Hypermetabolic 2.6 cm right hilar node with max SUV 4.4 (series 4/image 78). No hypermetabolic left hilar nodes. Incidental CT findings: Coronary atherosclerosis. Atherosclerotic nonaneurysmal thoracic aorta. Severe centrilobular emphysema. ABDOMEN/PELVIS: Hypermetabolic 1.9 cm left adrenal nodule with density 21 HU and max SUV 3.0. New hypermetabolic right adrenal nodules. No abnormal hypermetabolic activity within the liver, pancreas or spleen. No hypermetabolic lymph nodes in the abdomen or pelvis. Incidental CT findings: Nonobstructing 7 mm upper left renal stone. Simple 3.4 cm lower left renal cyst. Atherosclerotic nonaneurysmal abdominal aorta. SKELETON: Hypermetabolic midline sacral lesion with max SUV 4.2 and hypermetabolic L2 vertebral lesion with max SUV 5.1, relatively CT occult. Incidental CT findings: none IMPRESSION: 1. Hypermetabolic irregular 3.3 cm anterior right upper lobe lung mass, compatible with primary bronchogenic carcinoma. 2. Numerous (greater than 10) irregular solid bilateral pulmonary nodules, the largest of which are mildly hypermetabolic, compatible with metastatic disease. 3. Bulky hypermetabolic ipsilateral hilar, bilateral mediastinal and bilateral neck nodal metastases. 4. Hypermetabolic left adrenal metastasis. 5. 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 6. 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 7. Chronic findings include: Aortic Atherosclerosis (ICD10-I70.0) and Emphysema (ICD10-J43.9). Coronary atherosclerosis. Nonobstructing left nephrolithiasis. Chronic appearing bilateral paranasal sinusitis. Electronically Signed   By: Ilona Sorrel M.D.   On: 05/21/2020 14:06   DG Chest Portable 1 View  Result  Date: 06/02/2020 CLINICAL DATA:  Shortness of breath.  COPD.  Lung cancer suspected. EXAM: PORTABLE CHEST 1 VIEW COMPARISON:  05/02/2020 radiography.  PET scan 05/21/2020 FINDINGS: Heart size is normal. Right hilar adenopathy and right paratracheal adenopathy have worsened. Mass lesion in the right upper lobe consistent with a malignancy shown by PET scan. 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 No consolidation, collapse or effusion. No focal bone lesion. IMPRESSION: Worsened right hilar and right paratracheal adenopathy. Mass lesion in the right upper lobe consistent with a malignancy shown by PET scan. 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 No other acute process. Electronically Signed   By: Nelson Chimes M.D.   On: 06/02/2020 22:16   Assessment and Plan:  This is a very pleasant 75 year old male who presented with a right upper lobe lung mass with significant mediastinal and hilar adenopathy.  In addition, he has findings concerning  for adrenal metastasis, bone metastases, and now brain metastases.  I reviewed the imaging findings with the patient and his sister.  We discussed that findings are highly suspicious for metastatic lung cancer.  We discussed that we would need to proceed with a biopsy to confirm the diagnosis and determine what treatment options are available.  The patient and his sister have expressed that they would like to proceed with a biopsy to determine the diagnosis.  The patient has also expressed to me that he would be interested in radiation to his brain lesions and would like to meet with radiation oncology.  The patient has also expressed that he is not sure he would want to proceed with chemotherapy but after further discussion he states that he would consider treatment with other modalities such as immunotherapy or targeted therapy if he was  found to be a candidate.    Recommend for him to undergo a CT-guided biopsy of one of his lymph nodes by IR.  This was previously scheduled as an outpatient next week and I have ordered it today to be done inpatient.  I have also reached out to radiation oncology to request a consult on this patient for consideration of brain radiation.  I will defer to radiation oncology to order any additional imaging studies that they may need.  We will also defer to radiation oncology regarding dosing of dexamethasone for his brain metastasis and vasogenic edema.  Thank you for this referral.   Arthur Bussing, DNP, AGPCNP-BC, AOCNP  ADDENDUM: Hematology/Oncology Attending: The patient is seen and examined today.  I agree with the above note.  I recommended his care plan.  This is a very pleasant 75 years old white male with past medical history significant for COPD, hypertension, atrial fibrillation, diabetes mellitus as well as hypothyroidism.  The patient was admitted from the emergency department few days ago after a fall with hyperglycemia.  He has a known history of lung mass that was detected on previous chest x-ray and CT scan of the chest on May 06, 2020 which showed a dominant mass lesion in the right upper lobe with multiple parenchymal nodules bilaterally and significant mediastinal and hilar adenopathy bilaterally suspicious for primary lung cancer with associated metastatic disease.  He was undergoing evaluation by pulmonary medicine and a PET scan was performed on May 21, 2020 that showed 3.3 cm anterior right upper lobe lung mass compatible with the primary bronchogenic carcinoma with numerous irregular solid bilateral pulmonary nodules as well as bulky hypermetabolic ipsilateral hilar, bilateral mediastinal and bilateral neck nodal metastasis and suspicious hypermetabolic left adrenal metastasis and bone metastasis to the lumbar spine and sacrum.  During his evaluation at the emergency  department he had CT scan of the head which was followed by MRI of the brain later today and it showed at least 5 brain metastasis with the largest lesion showing blood products and moderate vasogenic edema.  There was potential for 2 additional subcentimeter metastasis.  The patient was started on Decadron which helped with the vasogenic edema.  He was also seen by radiation oncology for evaluation and recommendation regarding his brain metastasis. 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 We recommended for the patient to have ultrasound-guided core biopsy of one of the most accessible neck lymph node for confirmation of the tissue diagnosis. The patient will continue on the tapered dose of Decadron for now and this will be adjusted by radiation oncology as  a proceed with the palliative radiotherapy to the brain. Once the tissue diagnosis is confirmed, I will discuss with the patient has systemic treatment options which may include a combination of chemotherapy and immunotherapy or targeted therapy based on the final pathology and molecular work-up. I will arrange for the patient a follow-up appointment with me at the [facility] after discharge for more evaluation and discussion of his treatment options. I discussed these options with the patient and his sister [name] who is available at the bedside in more details. They are in agreement with the current plan. Thank you so much for allowing me to participate in the care of Mr. Adrin. I will continue to follow-up the patient with you and assist in his management on as-needed basis during this hospitalization.

## 2020-06-03 NOTE — H&P (Signed)
History and Physical    Arthur [name] UXL:[id number] DOB: [birth year] DOA: 06/02/2020  PCP: Bernerd Limbo, MD  Patient coming from: Home  I have personally briefly reviewed patient's old medical records in [place]  Chief Complaint: Fall and hyperglycemia   HPI: [name] is a 75 y.o. 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    Patient reports that for the last couple days he has had difficulty ambulating.  He has not been able to put much weight to his left lower extremity.  Then last night he fell out of bed due to left lower extremity weakness and laid on the ground for 2 hours until he was able to get back up.  Patient lives alone.  He also noted blood glucose of greater than 400-500 for the past several days.  Denies any nausea, vomiting or diarrhea.  He recently saw endocrinologist and had his basal rate decreased due to low fasting blood sugars.  His last hemoglobin A1c was 6.8%.  Patient also recently had new diagnosis of lung mass and mediastinal lymphadenopathy.  There is a planned biopsy of the lymph node. He denies any chest pain or shortness of breath  ED Course: He was afebrile but tachycardic and stable on room air.  Blood glucose of 400 with pH of 7.276, bicarb of 59, beta hydroxybutyrate of 1.12.  CT of the head shows a 1 cm solid mass in the inferior frontal lobe on the right with edema.  There is also 2 necrotic masses in the right hemisphere with surrounding edema.  2 mm right to left shift.  ED physician Dr. Sabra Heck discussed case with oncologist Dr. Earlie Server who recommended that patient be admitted for biopsy and to start Decadron.  Review of  Systems:  Constitutional: No Weight Change, No Fever ENT/Mouth: No sore throat, No Rhinorrhea Eyes: No Eye Pain, No Vision Changes Cardiovascular: No Chest Pain, no SOB Respiratory: + chronic Cough, No Sputum, No Wheezing, no Dyspnea  Gastrointestinal: No Nausea, No Vomiting, No Diarrhea, No Constipation, No Pain Genitourinary: no Urinary Incontinence, No Urgency, No Flank Pain Musculoskeletal: No Arthralgias, No Myalgias Skin: No Skin Lesions, No Pruritus, Neuro: + Weakness, No Numbness,  No Loss of Consciousness, No Syncope Psych: No Anxiety/Panic, No Depression, no decrease appetite Heme/Lymph: No Bruising, No Bleeding  Past Medical History:  Diagnosis Date  . Atrial fibrillation ([place])   . Chronic kidney disease   . COPD (chronic obstructive pulmonary disease) ([place])   . Hypertension   . Type 2 diabetes mellitus ([place])   . Vitiligo     Past Surgical History:  Procedure Laterality Date  . MELANOMA EXCISION       reports that he quit smoking about 32 years ago. He has a 37.50 pack-year smoking history. He has never used smokeless tobacco. He reports current alcohol use. No history on file for drug use. Social History  Allergies  Allergen Reactions  . Mefloquine Hcl Nausea And Vomiting    Family History  Problem Relation Age of Onset  . Stroke Mother   . Cancer Father      Prior to Admission medications   Medication Sig Start Date End Date Taking? Authorizing Provider  albuterol (VENTOLIN HFA) 108 (90 Base) MCG/ACT inhaler Inhale 2 puffs  into the lungs every 6 (six) hours as needed for wheezing or shortness of breath.    [provider]  ALLOPURINOL PO Take 300 mg by mouth daily.    [provider]  diltiazem (CARDIZEM) 120 MG tablet Take 120 mg by mouth daily.    [provider]  doxycycline (VIBRA-TABS) 100 MG tablet Take 1 tablet (100 mg total) by mouth 2 (two) times daily. 04/16/20   Laurin Coder, MD  flecainide (TAMBOCOR) 100 MG  tablet Take 100 mg by mouth 2 (two) times daily.    [provider]  Fluticasone-Umeclidin-Vilant (TRELEGY ELLIPTA) 200-62.5-25 MCG/INH AEPB Inhale 1 puff into the lungs daily. 05/02/20   Martyn Ehrich, NP  Fluticasone-Umeclidin-Vilant (TRELEGY ELLIPTA) 200-62.5-25 MCG/INH AEPB Inhale 1 puff into the lungs daily. 05/09/20   Martyn Ehrich, NP  hydrochlorothiazide (MICROZIDE) 12.5 MG capsule Take 12.5 mg by mouth daily.    [provider]  hydroxychloroquine (PLAQUENIL) 200 MG tablet  05/15/19   [provider]  insulin aspart (NOVOLOG) 100 UNIT/ML injection Inject into the skin as directed.    [provider]  levothyroxine (SYNTHROID) 88 MCG tablet Take 88 mcg by mouth daily before breakfast.    [provider]  OXYGEN 3lpm with exertion    [provider]  potassium chloride SA (K-DUR) 20 MEQ tablet Take 20 mEq by mouth daily.    [provider]  Respiratory Therapy Supplies (FLUTTER) DEVI Use as directed 11/14/18   Tanda Rockers, MD  Respiratory Therapy Supplies (FLUTTER) DEVI 1 Device by Does not apply route as needed. 08/09/19   Tanda Rockers, MD  temazepam (RESTORIL) 30 MG capsule Take 30 mg by mouth at bedtime as needed for sleep.    [provider]  torsemide (DEMADEX) 10 MG tablet Take 10 mg by mouth daily.    [provider]  UNABLE TO FIND Med Name: BIPAP with 3lpm o2    [provider]    Physical Exam: Vitals:   06/02/20 2146 06/02/20 2150  BP:  (!) 153/72  Pulse:  (!) 101  Resp:  16  Temp:  98.1 F (36.7 C)  TempSrc:  Oral  SpO2:  100%  Weight: 66.2 kg   Height: 5\' 10"  (1.778 m)     Constitutional: NAD, calm, comfortable, chronically ill-appearing cachectic male laying at 40 degree incline in bed Vitals:   06/02/20 2146 06/02/20 2150  BP:  (!) 153/72  Pulse:  (!) 101  Resp:  16  Temp:  98.1 F (36.7 C)  TempSrc:  Oral  SpO2:  100%  Weight: 66.2 kg   Height: 5\' 10"   (1.778 m)    Eyes: PERRL, lids and conjunctivae normal ENMT: Mucous membranes are moist.  Neck: supple Respiratory: clear to auscultation bilaterally, no wheezing, no crackles.  Labored respiration with conversation on 5 L via nasal cannula.  No accessory muscle use.  Cardiovascular: Regular rate and rhythm, no murmurs / rubs / gallops. No extremity edema. 2+ pedal pulses.   Abdomen: no tenderness, no masses palpated.  Bowel sounds positive.  Musculoskeletal: no clubbing / cyanosis. No joint deformity upper and lower extremities.  Cachectic appearing. Skin: no rashes, lesions, ulcers. No induration Neurologic: CN 2-12 grossly intact. Sensation intact, Strength 4/5 in left upper and lower extremity. Psychiatric: Normal judgment and insight. Alert and oriented x 3. Normal mood.     Labs on Admission: I have personally reviewed following labs and imaging studies  CBC: Recent Labs  Lab 06/02/20 2224 06/02/20 2233  WBC 12.8*  --   NEUTROABS 10.2*  --   HGB 14.0 16.0  HCT 42.8 47.0  MCV 95.7  --   PLT 281  --    Basic Metabolic Panel: Recent Labs  Lab 06/02/20 2210 06/02/20 2233  NA  --  132*  K 4.3 7.8*  CL  --  99  GLUCOSE  --  426*  BUN  --  59*  CREATININE  --  0.80   GFR: Estimated Creatinine Clearance: 78.2 mL/min (by C-G formula based on SCr of 0.8 mg/dL). Liver Function Tests: No results for input(s): AST, ALT, ALKPHOS, BILITOT, PROT, ALBUMIN in the last 168 hours. No results for input(s): LIPASE, AMYLASE in the last 168 hours. No results for input(s): AMMONIA in the last 168 hours. Coagulation Profile: Recent Labs  Lab 06/02/20 2224  INR 1.1   Cardiac Enzymes: No results for input(s): CKTOTAL, CKMB, CKMBINDEX, TROPONINI in the last 168 hours. BNP (last 3 results) No results for input(s): PROBNP in the last 8760 hours. HbA1C: No results for input(s): HGBA1C in the last 72 hours. CBG: Recent Labs  Lab 06/02/20 2139 06/02/20 2318  GLUCAP 400* 385*    Lipid Profile: No results for input(s): CHOL, HDL, LDLCALC, TRIG, CHOLHDL, LDLDIRECT in the last 72 hours. Thyroid Function Tests: No results for input(s): TSH, T4TOTAL, FREET4, T3FREE, THYROIDAB in the last 72 hours. Anemia Panel: No results for input(s): VITAMINB12, FOLATE, FERRITIN, TIBC, IRON, RETICCTPCT in the last 72 hours. Urine analysis:    Component Value Date/Time   COLORURINE YELLOW 06/24/2010 1324   APPEARANCEUR CLEAR 06/24/2010 1324   LABSPEC 1.026 06/24/2010 1324   PHURINE 5.5 06/24/2010 1324   GLUCOSEU >1000 (A) 06/24/2010 1324   HGBUR NEGATIVE 06/24/2010 1324   BILIRUBINUR NEGATIVE 06/24/2010 1324   KETONESUR >80 (A) 06/24/2010 1324   PROTEINUR NEGATIVE 06/24/2010 1324   UROBILINOGEN 0.2 06/24/2010 1324   NITRITE NEGATIVE 06/24/2010 1324   LEUKOCYTESUR NEGATIVE 06/24/2010 1324    Radiological Exams on Admission: CT HEAD WO CONTRAST  Result Date: 06/02/2020 CLINICAL DATA:  [place].  History of metastatic lung cancer. EXAM: CT HEAD WITHOUT CONTRAST TECHNIQUE: Contiguous axial images were obtained from the base of the skull through the vertex without intravenous contrast. COMPARISON:  PET scan 05/21/2020 FINDINGS: Brain: No focal abnormality seen affecting the brainstem or cerebellum. There is a 1 cm mass at the inferior frontal lobe on the right with surrounding edema. There is a necrotic mass in the right frontal lobe measuring 3.3 cm in diameter, with surrounding edema, consistent with a metastasis. There is a necrotic mass at the right parietal vertex measuring up to 2.8 cm in diameter consistent with a metastasis. Some surrounding edema in this location as well. No metastasis to the left hemisphere identified. No hydrocephalus. Right-to-left shift of 2 mm. No extra-axial collection. Vascular: There is atherosclerotic calcification of the major vessels at the base of the brain. Skull: No fracture. No evidence skull or skull base metastatic lesion. Sinuses/Orbits: Mucosal  inflammatory changes of the left maxillary sinus and left ethmoid sinuses. Orbits negative. Other: None IMPRESSION: 1 cm solid mass in the inferior frontal lobe on the right with mild edema. Two necrotic masses in the right hemisphere, located in the right frontal lobe and right parietal vertex consistent with metastases. Surrounding edema. 2 mm of right-to-left shift. No hydrocephalus. No traumatic finding. Non necrotic portions of the metastases are slightly hyperdense, likely indicating the presence of petechial hemorrhage. This  hemorrhage probably relates to the biology of the tumor rather than the recent fall. Electronically Signed   By: Nelson Chimes M.D.   On: 06/02/2020 22:23   DG Chest Portable 1 View  Result Date: 06/02/2020 CLINICAL DATA:  Shortness of breath.  COPD.  Lung cancer suspected. EXAM: PORTABLE CHEST 1 VIEW COMPARISON:  05/02/2020 radiography.  PET scan 05/21/2020 FINDINGS: Heart size is normal. Right hilar adenopathy and right paratracheal adenopathy have worsened. Mass lesion in the right upper lobe consistent with a malignancy shown by PET scan. 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 No consolidation, collapse or effusion. No focal bone lesion. IMPRESSION: Worsened right hilar and right paratracheal adenopathy. Mass lesion in the right upper lobe consistent with a malignancy shown by PET scan. 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 No other acute process. Electronically Signed   By: Nelson Chimes M.D.   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 Had planned lymph node biopsy.  Now with new LE weakness and multiple tumor/necrotic mass with 64mm right to left midline shift on  CT head Oncology recommends IV Decadron daily and will plan for biopsy I spoke with neurosurgeon Dr. Glenford Peers who agrees with steroid but did not advise any further CT imaging as this is likely subacute in nature  place on seizure precaution   Hyperglycemia with Type 1 diabetes no signs of DKA Continue IV insulin infusion as patient will be getting IV steroids daily continue insulin gtt with goal of 140-180 and AG <12 IV NS until BG <250, then switch to D5 1/2 NS  BMP q4hr  keep NPO  Hyperkalemia Initial potassium 7.8.  Could be due to hemolysis.  This is now resolved down to 4.3 on repeat lab  COPD with chronic hypoxemic respiratory failure Stable.  Baseline oxygen of 4 L  Paroxysmal atrial fibrillation Continue flecainide and diltiazem  Hypertension Continue diltiazem and Lasix   OSA Continue qHS Bipap   Post ablative hypothyroidism Continue levothyroxine  Protein calorie malnutrition Dietary consult   DVT prophylaxis:.scds  Code Status: DNR Family Communication: Plan discussed with patient at bedside  disposition Plan: Home with at least 2 midnight stays  Consults called:  Admission status: inpatient  Level of care: Stepdown  Status is: Inpatient  Remains inpatient appropriate because:Inpatient level of care appropriate due to severity of illness   Dispo: The patient is from: Home              Anticipated d/c is to: Home              Anticipated d/c date is: > 3 days              Patient currently is not medically stable to d/c.   Difficult to place patient No         Orene Desanctis DO [facility]   If 7PM-7AM, please contact night-coverage [email or web address]   06/03/2020, 12:04 AM

## 2020-06-03 NOTE — ED Notes (Signed)
Attempted to call report, no answer at this time.

## 2020-06-03 NOTE — Progress Notes (Signed)
Failed attempt at MRI. Attempted to reach RN but not answer.

## 2020-06-03 NOTE — Progress Notes (Signed)
Inpatient Diabetes Program Recommendations  AACE/ADA: New Consensus Statement on Inpatient Glycemic Control (2015)  Target Ranges:  Prepandial:   less than 140 mg/dL      Peak postprandial:   less than 180 mg/dL (1-2 hours)      Critically ill patients:  140 - 180 mg/dL     Review of Glycemic Control Results for Arthur [name], Arthur [name] (MRN [id number]) as of 06/03/2020 13:22  Ref. Range 06/03/2020 03:38 06/03/2020 04:13 06/03/2020 05:40 06/03/2020 06:33 06/03/2020 11:39  Glucose-Capillary Latest Ref Range: 70 - 99 mg/dL 144 (H) 138 (H) 146 (H) 157 (H) 150 (H)   Diabetes history: DM 1 Outpatient Diabetes medications: Insulin pump-630 G/ 0.7units-0.8 units/hr  Current orders for Inpatient glycemic control:  IV insulin transitioning to Lantus 15 units daily, Novolog 3 units tid with meals, and Novolog sensitive tid with meals and HS Decadron 10 mg daily Inpatient Diabetes Program Recommendations:    Patient wears insulin pump at home, however he does not have supplies in the hospital.  Agree with Lantus, Novolog meal coverage and correction.  Insulin doses may need to be adjusted with steroids as well.  Patient see's Dr. Hartford Poli (endocrinologist) for his DM.  Will follow.   Thanks  Adah Perl, RN, BC-ADM Inpatient Diabetes Coordinator Pager [phone] (8a-5p)

## 2020-06-04 ENCOUNTER — Inpatient Hospital Stay (HOSPITAL_COMMUNITY): Payer: Medicare Other

## 2020-06-04 ENCOUNTER — Encounter: Payer: Self-pay | Admitting: *Deleted

## 2020-06-04 ENCOUNTER — Other Ambulatory Visit: Payer: Self-pay

## 2020-06-04 ENCOUNTER — Other Ambulatory Visit: Payer: Self-pay | Admitting: Radiation Therapy

## 2020-06-04 DIAGNOSIS — E440 Moderate protein-calorie malnutrition: Secondary | ICD-10-CM | POA: Insufficient documentation

## 2020-06-04 DIAGNOSIS — C7931 Secondary malignant neoplasm of brain: Secondary | ICD-10-CM | POA: Diagnosis not present

## 2020-06-04 LAB — CBC WITH DIFFERENTIAL/PLATELET
Abs Immature Granulocytes: 0.07 10*3/uL (ref 0.00–0.07)
Basophils Absolute: 0 10*3/uL (ref 0.0–0.1)
Basophils Relative: 0 %
Eosinophils Absolute: 0 10*3/uL (ref 0.0–0.5)
Eosinophils Relative: 0 %
HCT: 42.4 % (ref 39.0–52.0)
Hemoglobin: 13.8 g/dL (ref 13.0–17.0)
Immature Granulocytes: 1 %
Lymphocytes Relative: 3 %
Lymphs Abs: 0.4 10*3/uL — ABNORMAL LOW (ref 0.7–4.0)
MCH: 31.2 pg (ref 26.0–34.0)
MCHC: 32.5 g/dL (ref 30.0–36.0)
MCV: 95.7 fL (ref 80.0–100.0)
Monocytes Absolute: 0.7 10*3/uL (ref 0.1–1.0)
Monocytes Relative: 6 %
Neutro Abs: 10.5 10*3/uL — ABNORMAL HIGH (ref 1.7–7.7)
Neutrophils Relative %: 90 %
Platelets: 265 10*3/uL (ref 150–400)
RBC: 4.43 MIL/uL (ref 4.22–5.81)
RDW: 15.9 % — ABNORMAL HIGH (ref 11.5–15.5)
WBC: 11.6 10*3/uL — ABNORMAL HIGH (ref 4.0–10.5)
nRBC: 0 % (ref 0.0–0.2)

## 2020-06-04 LAB — COMPREHENSIVE METABOLIC PANEL
ALT: 15 U/L (ref 0–44)
AST: 21 U/L (ref 15–41)
Albumin: 3.3 g/dL — ABNORMAL LOW (ref 3.5–5.0)
Alkaline Phosphatase: 103 U/L (ref 38–126)
Anion gap: 9 (ref 5–15)
BUN: 30 mg/dL — ABNORMAL HIGH (ref 8–23)
CO2: 33 mmol/L — ABNORMAL HIGH (ref 22–32)
Calcium: 9 mg/dL (ref 8.9–10.3)
Chloride: 98 mmol/L (ref 98–111)
Creatinine, Ser: 0.72 mg/dL (ref 0.61–1.24)
GFR, Estimated: >60 mL/min (ref >60)
Glucose, Bld: 435 mg/dL — ABNORMAL HIGH (ref 70–99)
Potassium: 4.8 mmol/L (ref 3.5–5.1)
Sodium: 140 mmol/L (ref 135–145)
Total Bilirubin: 0.8 mg/dL (ref 0.3–1.2)
Total Protein: 6 g/dL — ABNORMAL LOW (ref 6.5–8.1)

## 2020-06-04 LAB — GLUCOSE, CAPILLARY
Glucose-Capillary: 387 mg/dL — ABNORMAL HIGH (ref 70–99)
Glucose-Capillary: 426 mg/dL — ABNORMAL HIGH (ref 70–99)
Glucose-Capillary: 416 mg/dL — ABNORMAL HIGH (ref 70–99)
Glucose-Capillary: 189 mg/dL — ABNORMAL HIGH (ref 70–99)
Glucose-Capillary: 116 mg/dL — ABNORMAL HIGH (ref 70–99)

## 2020-06-04 LAB — BASIC METABOLIC PANEL
Anion gap: 10 (ref 5–15)
BUN: 33 mg/dL — ABNORMAL HIGH (ref 8–23)
CO2: 32 mmol/L (ref 22–32)
Calcium: 9.2 mg/dL (ref 8.9–10.3)
Chloride: 97 mmol/L — ABNORMAL LOW (ref 98–111)
Creatinine, Ser: 0.83 mg/dL (ref 0.61–1.24)
GFR, Estimated: >60 mL/min (ref >60)
Glucose, Bld: 482 mg/dL — ABNORMAL HIGH (ref 70–99)
Potassium: 4.7 mmol/L (ref 3.5–5.1)
Sodium: 139 mmol/L (ref 135–145)

## 2020-06-04 LAB — HEMOGLOBIN A1C
Hgb A1c MFr Bld: 7.8 % — ABNORMAL HIGH (ref 4.8–5.6)
Mean Plasma Glucose: 177 mg/dL

## 2020-06-04 MED ORDER — INSULIN ASPART 100 UNIT/ML ~~LOC~~ SOLN
18.0000 [IU] | Freq: Once | SUBCUTANEOUS | Status: AC
  Administered 2020-06-04: 18 [IU] via SUBCUTANEOUS

## 2020-06-04 MED ORDER — GLUCERNA SHAKE PO LIQD
237.0000 mL | Freq: Two times a day (BID) | ORAL | Status: DC
  Administered 2020-06-04 – 2020-06-13 (×16): 237 mL via ORAL
  Filled 2020-06-04 (×20): qty 237

## 2020-06-04 MED ORDER — INSULIN ASPART 100 UNIT/ML ~~LOC~~ SOLN
4.0000 [IU] | Freq: Three times a day (TID) | SUBCUTANEOUS | Status: DC
  Administered 2020-06-04 – 2020-06-05 (×3): 4 [IU] via SUBCUTANEOUS

## 2020-06-04 MED ORDER — FENTANYL CITRATE (PF) 100 MCG/2ML IJ SOLN
INTRAMUSCULAR | Status: AC | PRN
  Administered 2020-06-04: 50 ug via INTRAVENOUS

## 2020-06-04 MED ORDER — PROSOURCE PLUS PO LIQD
30.0000 mL | Freq: Every day | ORAL | Status: DC
  Administered 2020-06-06 – 2020-06-10 (×4): 30 mL via ORAL
  Filled 2020-06-04 (×6): qty 30

## 2020-06-04 MED ORDER — MIDAZOLAM HCL 2 MG/2ML IJ SOLN
INTRAMUSCULAR | Status: AC | PRN
  Administered 2020-06-04: 1 mg via INTRAVENOUS

## 2020-06-04 MED ORDER — FENTANYL CITRATE (PF) 100 MCG/2ML IJ SOLN
INTRAMUSCULAR | Status: AC
  Filled 2020-06-04: qty 2

## 2020-06-04 MED ORDER — INSULIN GLARGINE 100 UNIT/ML ~~LOC~~ SOLN
25.0000 [IU] | Freq: Every day | SUBCUTANEOUS | Status: DC
  Administered 2020-06-05: 25 [IU] via SUBCUTANEOUS
  Filled 2020-06-04: qty 0.25

## 2020-06-04 MED ORDER — INSULIN ASPART 100 UNIT/ML ~~LOC~~ SOLN
0.0000 [IU] | Freq: Three times a day (TID) | SUBCUTANEOUS | Status: DC
  Administered 2020-06-04: 4 [IU] via SUBCUTANEOUS
  Administered 2020-06-05: 11 [IU] via SUBCUTANEOUS
  Administered 2020-06-05: 15 [IU] via SUBCUTANEOUS
  Administered 2020-06-05: 20 [IU] via SUBCUTANEOUS
  Administered 2020-06-06: 3 [IU] via SUBCUTANEOUS
  Administered 2020-06-06: 15 [IU] via SUBCUTANEOUS
  Administered 2020-06-06: 11 [IU] via SUBCUTANEOUS
  Administered 2020-06-07: 4 [IU] via SUBCUTANEOUS
  Administered 2020-06-07: 15 [IU] via SUBCUTANEOUS
  Administered 2020-06-07: 7 [IU] via SUBCUTANEOUS
  Administered 2020-06-08: 3 [IU] via SUBCUTANEOUS
  Administered 2020-06-08: 15 [IU] via SUBCUTANEOUS
  Administered 2020-06-08 – 2020-06-09 (×2): 11 [IU] via SUBCUTANEOUS
  Administered 2020-06-09: 7 [IU] via SUBCUTANEOUS
  Administered 2020-06-09: 20 [IU] via SUBCUTANEOUS
  Administered 2020-06-10: 3 [IU] via SUBCUTANEOUS
  Administered 2020-06-10: 4 [IU] via SUBCUTANEOUS
  Administered 2020-06-11: 20 [IU] via SUBCUTANEOUS
  Administered 2020-06-11: 4 [IU] via SUBCUTANEOUS
  Administered 2020-06-11: 11 [IU] via SUBCUTANEOUS
  Administered 2020-06-12 – 2020-06-13 (×3): 4 [IU] via SUBCUTANEOUS
  Administered 2020-06-13 (×2): 7 [IU] via SUBCUTANEOUS

## 2020-06-04 MED ORDER — GADOBUTROL 1 MMOL/ML IV SOLN
6.0000 mL | Freq: Once | INTRAVENOUS | Status: AC | PRN
  Administered 2020-06-04: 6 mL via INTRAVENOUS

## 2020-06-04 MED ORDER — LIDOCAINE HCL 1 % IJ SOLN
INTRAMUSCULAR | Status: AC
  Filled 2020-06-04: qty 20

## 2020-06-04 MED ORDER — LIDOCAINE HCL (PF) 1 % IJ SOLN
INTRAMUSCULAR | Status: AC | PRN
  Administered 2020-06-04: 10 mL via INTRADERMAL

## 2020-06-04 MED ORDER — MIDAZOLAM HCL 2 MG/2ML IJ SOLN
INTRAMUSCULAR | Status: AC
  Filled 2020-06-04: qty 2

## 2020-06-04 NOTE — Progress Notes (Signed)
Inpatient Diabetes Program Recommendations  AACE/ADA: New Consensus Statement on Inpatient Glycemic Control (2015)  Target Ranges:  Prepandial:   less than 140 mg/dL      Peak postprandial:   less than 180 mg/dL (1-2 hours)      Critically ill patients:  140 - 180 mg/dL   Lab Results  Component Value Date   GLUCAP 387 (H) 06/04/2020   HGBA1C 7.8 (H) 06/03/2020    Review of Glycemic Control Results for Arthur [name], Arthur [name] (MRN [id number]) as of 06/04/2020 09:20  Ref. Range 06/03/2020 11:39 06/03/2020 14:20 06/03/2020 16:44 06/03/2020 23:03 06/04/2020 08:05  Glucose-Capillary Latest Ref Range: 70 - 99 mg/dL 150 (H) 163 (H) 242 (H) 380 (H) 387 (H)   History: Type 1 DM Outpatient Diabetes medications: Insulin pump-630 G/ 0.7units-0.8 units/hr Current orders for Inpatient glycemic control:  Lantus 15 units daily, Novolog 3 units tid with meals, and Novolog sensitive tid with meals and HS Decadron 10 mg daily Inpatient Diabetes Program Recommendations:   Note blood sugars>goal likely due to steroids. Please consider increasing Lantus to 25 units daily and increase Novolog meal coverage to 5 units tid with meals.   Thanks,  Adah Perl, RN, BC-ADM Inpatient Diabetes Coordinator Pager [phone] (8a-5p)

## 2020-06-04 NOTE — Progress Notes (Signed)
Initial Nutrition Assessment  DOCUMENTATION CODES:   Non-severe (moderate) malnutrition in context of chronic illness  INTERVENTION:  - diet re-advancement as medically feasible. - will order Glucerna Shake BID, each supplement provides 220 kcal and 10 grams of protein. - will order 30 ml Prosource Plus once/day, each supplement provides 100 kcal and 15 grams protein.   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  GOAL:   Patient will meet greater than or equal to 90% of their needs  MONITOR:   Diet advancement,PO intake,Supplement acceptance,Labs,Weight trends  REASON FOR ASSESSMENT:   Consult Assessment of nutrition requirement/status  ASSESSMENT:   75 y.o. 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 He presented to the ED due to hyperglycemia. He reported difficulty ambulating for several days PTA and experienced a fall the night PTA.  Patient laying in bed with no family or visitors at bedside. Diet advanced from NPO to Carb Modified yesterday at 0810 and changed back to NPO today at 1040. No intakes documented.  Patient reports that he has two procedures this afternoon and will be unable to eat lunch and he does not think he will be able to eat dinner.   He reports eating nearly 100% of breakfast which consisted of scrambled eggs, sausage, a fruit cup, and a cup of grits. He denies any abdominal pain/pressure or nausea with or without PO intakes today or PTA.   Patient reports that he began feeling unwell around 04/16/20 and that appetite has been decreased since that time. He  was also experiencing weakness and having difficulty ambulating which did lead to several falls over the past 1.5 months. Unclear if he had a walker or cane at home.   Patient reports that he got up to ~200 lb five years ago and that since that time he had been working to lose weight. He reports that he then began to lose weight unintentionally.   He feels he has lost 10 lb in the past 1.5 months. Weight on 2/13 was 146 lb and PTA the most recently documented weight was on 01/30/20 when he weighed 154 lb. This indicates 8 lb weight loss (5.2% body weight) in the past 4 months; not significant for time frame.    Labs reviewed; CBGs: 387 and 426 mg/dl, BUN: 30 mg/dl. Medications reviewed; sliding scale novolog, 3 units novolog TID, 15 units lantus/day, 88 mcg oral synthroid/day, 20 mEq Klor-Con/day.  IVF; D5-LR @ 125 ml/hr (510 kcal/24 hours).      NUTRITION - FOCUSED PHYSICAL EXAM:  Flowsheet Row Most Recent Value  Orbital Region Mild depletion  Upper Arm Region Moderate depletion  Thoracic and Lumbar Region Moderate depletion  Buccal Region Moderate depletion  Temple Region Mild depletion  Clavicle Bone Region Moderate depletion  Clavicle and Acromion Bone Region Moderate depletion  Scapular Bone Region Unable to assess  Dorsal Hand Mild depletion  Patellar Region Unable to assess  Anterior Thigh Region Unable to assess  Posterior Calf Region Unable to assess  Edema (RD Assessment) Unable to assess  Hair Reviewed  Eyes Reviewed  Mouth Unable to assess  [wearing mask]  Skin Reviewed  Nails Reviewed       Diet Order:  Diet Order            Diet NPO time specified  Diet effective now                 EDUCATION NEEDS:   Education needs have been addressed  Skin:  Skin Assessment: Reviewed RN Assessment (L knee abrasion)  Last BM:  2/14 (type 3)  Height:   Ht Readings from Last 1 Encounters:  06/03/20 5\' 10"  (1.778 m)    Weight:   Wt Readings from Last 1  Encounters:  06/02/20 66.2 kg    Estimated Nutritional Needs:  Kcal:  1990-2185 kcal Protein:  110-125 grams Fluid:  >/= 2.2 L/day     Jarome Matin, MS, RD, LDN, CNSC Inpatient Clinical Dietitian RD pager # available in [place]  After hours/weekend pager # available in [facility]

## 2020-06-04 NOTE — TOC Initial Note (Signed)
Transition of Care [facility]) - Initial/Assessment Note    Patient Details  Name: Arthur [name] MRN: [id number] Date of Birth: 02/22/1946  Transition of Care [facility]) CM/SW Contact:    Leeroy Cha, RN Phone Number: 06/04/2020, 7:58 AM  Clinical Narrative:                 75 year old white male known history of DM TY 2 with current Medtronic insulin pump HTN hypothyroid porphyria cutanea tarda Paroxysmal A. 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 Gertie Gowda Positive Cologuard-refusing work-up  02/2019 Found to have right upper lobe lung mass after office visit January-not a great candidate for bronchoscopy given severe emphysema risk of pneumothorax etc. PET scan showed metastatic disease to left adrenal and bone in addition to nodules Presented to emergency room 2/13 secondary to fall causing insulin pump to fall out and sugars were in the 500 range Also stated weakness, inability to stand, being down on the ground CT head =1 cm solid mass inferior frontal lobe on the right side with edema and necrotic masses in the right hemisphere with surrounding edema with 2 mm right to left shift Oncology consulted Patient started on Decadron Neurosurgeon Dr. Glenford Peers consulted did not advise any further imaging  Hyperglycemia aggressively treated with IV insulin, but as no gap we have transitioned him to sliding scale with his insulin pump Patient tells me he lives alone and still feels weak on his left side in the upper and lower extremity  He is coherent and tells me that he does not wish any aggressive chemo or any aggressive management and understands that this is a life limiting illness with metastatic disease after we discussed this in more detail PLAN: snf placment with possible hospice Expected Discharge Plan: Skilled Nursing Facility Barriers to Discharge: Continued Medical Work up   Patient Goals and CMS Choice Patient states their  goals for this hospitalization and ongoing recovery are:: unable to clearly state CMS Medicare.gov Compare Post Acute Care list provided to:: Patient    Expected Discharge Plan and Services Expected Discharge Plan: [place]   Discharge Planning Services: CM Consult Post Acute Care Choice: [place] Living arrangements for the past 2 months: Single Family Home                                      Prior Living Arrangements/Services Living arrangements for the past 2 months: Single Family Home Lives with:: Self Patient language and need for interpreter reviewed:: Yes        Need for Family Participation in Patient Care: Yes (Comment) Care giver support system in place?: Yes (comment)   Criminal Activity/Legal Involvement Pertinent to Current Situation/Hospitalization: No - Comment as needed  Activities of Daily Living Home Assistive Devices/Equipment: BIPAP,Eyeglasses,Oxygen,Insulin Pump,CBG Meter ADL Screening (condition at time of admission) Patient's cognitive ability adequate to safely complete daily activities?: Yes Is the patient deaf or have difficulty hearing?: No Does the patient have difficulty seeing, even when wearing glasses/contacts?: No Does the patient have difficulty concentrating, remembering, or making decisions?: No Patient able to express need for assistance with ADLs?: Yes Does the patient have difficulty dressing or bathing?: No Independently performs ADLs?: Yes (appropriate for developmental age) Does the patient have difficulty walking or climbing stairs?: Yes (secondary to weakness) Weakness of Legs: Both Weakness of Arms/Hands: None  Permission Sought/Granted  Emotional Assessment Appearance:: Appears stated age Attitude/Demeanor/Rapport: Unable to Assess Affect (typically observed): Unable to Assess Orientation: : Fluctuating Orientation (Suspected and/or reported Sundowners) Alcohol /  Substance Use: Not Applicable Psych Involvement: No (comment)  Admission diagnosis:  Brain tumor ([place]) [D49.6] Brain metastases ([place]) [C79.31] Hyperglycemia [R73.9] Metastatic malignant neoplasm, unspecified site [facility]) [C79.9] Patient Active Problem List   Diagnosis Date Noted  . Hyperglycemia 06/03/2020  . Hyperkalemia 06/03/2020  . PAF (paroxysmal atrial fibrillation) ([place]) 06/03/2020  . HTN (hypertension) 06/03/2020  . Hypothyroidism 06/03/2020  . Protein calorie malnutrition ([place]) 06/03/2020  . Brain metastases ([place]) 06/02/2020  . Hemoptysis 05/07/2020  . OSA treated with BiPAP 05/07/2020  . Chronic respiratory failure with hypoxia ([place]) 11/15/2018  . COPD GOLD IV  11/14/2018   PCP:  Bernerd Limbo, MD Pharmacy:   [id number] - [place], [place] [address] [place] [address] Phone: [phone] Fax: [phone]     Social Determinants of Health (SDOH) Interventions    Readmission Risk Interventions No flowsheet data found.

## 2020-06-04 NOTE — Progress Notes (Signed)
The patient poured water on himself by  Accident, and refused to be changed. Will try again later. Noted ntermittent confusion exoterically when he wakes up from sleep. No acute distress noted. Will continue to monitor.

## 2020-06-04 NOTE — Progress Notes (Signed)
I spoke with Mikey Bussing NP regarding Mr. Hedgepath.  She has ordered US bx for inpatient.  I reached out to IR to see what to do to schedule.  Wait for response.

## 2020-06-04 NOTE — Progress Notes (Signed)
CBG was 380 mg/dL this shift and there was no sliding scale . Notified [place] NP and received a sliding scale for the night. Order already implemented. Will continue to monitor.

## 2020-06-04 NOTE — Progress Notes (Signed)
Informed by RN that Pt stated that he did not want to wear his BiPAP tonight while sleeping.  RN to contact RT should the Pt change his mind.

## 2020-06-04 NOTE — Evaluation (Signed)
Occupational Therapy Evaluation Patient Details Name: Arthur [name] MRN: [id number] DOB: 07/13/1945 Today's Date: 06/04/2020    History of Present Illness Arthur [name] is a 75 y.o. male with medical history significant for COPD with chronic hypoxemia on baseline 4 L, paroxysmal atrial fibrillation, hypertension, OSA, type 1 diabetes with retinopathy, post ablative hypothyroidism, and recent diagnosis of lung cancer with metastasis to bone, left adrenal and mediastinal lymphadenopathy who presents with fall, left leg weakness and  hyperglycemia.  CT-Two necrotic masses in the right hemisphere, located in the  right frontal lobe and right parietal vertex consistent with  metastases.   Clinical Impression   Pt admitted with above. He demonstrates the below listed deficits and will benefit from continued OT to maximize safety and independence with BADLs.  Pt presents to OT with generalized weakness, impaired balance, ataxia/dysmetria, Lt hemiparesis, higher level cognitive deficits with attention and complex problem solving.  He currently requires set up - max A for ADLs.  He reports he was living alone PTA, and was independent, but has had a rapid decline in function.  I anticipate he will require SNF level rehab/discharge depending on how he wishes to proceed with treatment.  IF he has 24 hour assistance, and family chooses home discharge, would recommend CIR to reduce burden of care.        Follow Up Recommendations  SNF (if he has 24 hour assist, and pt/family choose home discharge, I would recommend CIR consult to reduce burden of care).    Equipment Recommendations  None recommended by OT    Recommendations for Other Services       Precautions / Restrictions Precautions Precautions: Fall Precaution Comments: Left LE > UE weakness,; Lt inattention, Ataxia Restrictions Weight Bearing Restrictions: No      Mobility Bed Mobility Overal bed mobility: Needs Assistance Bed Mobility:  Supine to Sit     Supine to sit: Mod assist;HOB elevated     General bed mobility comments: Lt LE hanging off lt side of the bed.  He required assist to lift it back onto the bed, and assist to moved off the bed to the Right, and min A to lift trunk    Transfers Overall transfer level: Needs assistance Equipment used: 1 person hand held assist Transfers: Sit to/from [facility] Sit to Stand: Mod assist Stand pivot transfers: Mod assist       General transfer comment: Assist to boost into standing, and assist for balance.  Lt knee blocked to prevent buckling    Balance Overall balance assessment: Needs assistance;History of Falls Sitting-balance support: No upper extremity supported;Feet unsupported Sitting balance-Leahy Scale: Fair Sitting balance - Comments: able to maintain static sitting with min guard assist.   Standing balance support: Bilateral upper extremity supported Standing balance-Leahy Scale: Poor Standing balance comment: requires mod A                           ADL either performed or assessed with clinical judgement   ADL Overall ADL's : Needs assistance/impaired Eating/Feeding: Minimal assistance;Sitting   Grooming: Wash/dry hands;Wash/dry face;Brushing hair;Set up;Sitting   Upper Body Bathing: Minimal assistance;Sitting   Lower Body Bathing: Maximal assistance;Sit to/from stand   Upper Body Dressing : Moderate assistance;Sitting   Lower Body Dressing: Maximal assistance;Sit to/from stand   Toilet Transfer: Moderate assistance;Stand-pivot;BSC Toilet Transfer Details (indicate cue type and reason): pt ataxic and Lt knee buckles Toileting- Clothing Manipulation and Hygiene: Total assistance;Sit to/from stand  Functional mobility during ADLs: Moderate assistance       Vision Baseline Vision/History: Wears glasses Wears Glasses: At all times Patient Visual Report: No change from baseline Vision Assessment?:  Yes Eye Alignment: Within Functional Limits Ocular Range of Motion: Within Functional Limits Alignment/Gaze Preference: Within Defined Limits Tracking/Visual Pursuits: Impaired - to be further tested in functional context Visual Fields: No apparent deficits Additional Comments: during pursuits, pt frequently looses fixation     Perception Perception Perception Tested?: Yes Perception Deficits: Inattention/neglect Inattention/Neglect: Does not attend to left side of body Spatial deficits: requires cues to attend to Lt UE and Lt LE consistently during activity   Praxis Praxis Praxis tested?: Within functional limits    Pertinent Vitals/Pain Pain Assessment: No/denies pain     Hand Dominance Right   Extremity/Trunk Assessment Upper Extremity Assessment Upper Extremity Assessment: RUE deficits/detail;LUE deficits/detail RUE Deficits / Details: dysmetria noted.  AROM WFL.  Grossly 4/5-4+/5 RUE Coordination: decreased gross motor;decreased fine motor LUE Deficits / Details: shoulder elevation sot ~80*; ataxia noted.  Gross grasp/release.  Lt inattention noted LUE Coordination: decreased gross motor;decreased fine motor   Lower Extremity Assessment Lower Extremity Assessment: Defer to PT evaluation   Cervical / Trunk Assessment Cervical / Trunk Assessment: Kyphotic   Communication Communication Communication: Other (comment) (? ataxic speech pattern)   Cognition Arousal/Alertness: Awake/alert Behavior During Therapy: WFL for tasks assessed/performed Overall Cognitive Status: Impaired/Different from baseline Area of Impairment: Attention;Following commands;Problem solving                   Current Attention Level: Selective   Following Commands: Follows one step commands consistently;Follows multi-step commands with increased time;Follows multi-step commands inconsistently     Problem Solving: Slow processing;Requires verbal cues General Comments: Pt is aware of  deficits.  he will self distract during activity.  Requires increased time to complete multi step tasks, and at times requires instruction repeated   General Comments  Sp02 88 during activity with pt on 3L supplemental 02.  He quickly rebounded to 93% with seated rest break after ~1.5 mins    Exercises     Shoulder Instructions      Home Living Family/patient expects to be discharged to:: Unsure Living Arrangements: Alone Available Help at Discharge: Family;Available PRN/intermittently Type of Home: House Home Access: Stairs to enter [employer] of Steps: 4-6   Home Layout: One level               Home Equipment: Walker - 2 wheels   Additional Comments: on Home  O2 4 L      Prior Functioning/Environment Level of Independence: Needs assistance  Gait / Transfers Assistance Needed: was  independent ambulator until 2/10 when weakness began quickly.  He reports one fall, and spending a lot of time in the bed              OT Problem List: Decreased strength;Decreased range of motion;Decreased activity tolerance;Impaired balance (sitting and/or standing);Decreased coordination;Impaired vision/perception;Decreased cognition;Decreased knowledge of use of DME or AE;Cardiopulmonary status limiting activity;Impaired tone;Impaired UE functional use      OT Treatment/Interventions: Self-care/ADL training;Neuromuscular education;Energy conservation;DME and/or AE instruction;Therapeutic activities;Cognitive remediation/compensation;Visual/perceptual remediation/compensation;Patient/family education;Balance training    OT Goals(Current goals can be found in the care plan section) Acute Rehab OT Goals Patient Stated Goal: to eat breakfast OT Goal Formulation: With patient Time For Goal Achievement: 06/18/20 Potential to Achieve Goals: Good ADL Goals Pt Will Perform Grooming: with mod assist;standing Pt Will Perform Upper Body Bathing: with set-up;with  supervision;sitting Pt Will Perform Lower Body Bathing: with min assist;sit to/from stand Pt Will Perform Upper Body Dressing: with min assist;sitting Pt Will Perform Lower Body Dressing: with mod assist;with adaptive equipment;sit to/from stand Pt Will Transfer to Toilet: with min assist;stand pivot transfer;bedside commode Pt Will Perform Toileting - Clothing Manipulation and hygiene: with mod assist;sit to/from stand  OT Frequency: Min 2X/week   Barriers to D/C: Decreased caregiver support  unsure he has adequate support at home for home discharge       Co-evaluation              AM-PAC OT "6 Clicks" Daily Activity     Outcome Measure Help from another person eating meals?: A Little Help from another person taking care of personal grooming?: A Little Help from another person toileting, which includes using toliet, bedpan, or urinal?: A Lot Help from another person bathing (including washing, rinsing, drying)?: A Lot Help from another person to put on and taking off regular upper body clothing?: A Lot Help from another person to put on and taking off regular lower body clothing?: A Lot 6 Click Score: 14   End of Session Equipment Utilized During Treatment: Oxygen;Gait belt Nurse Communication: Mobility status;Need for lift equipment  Activity Tolerance: Patient tolerated treatment well Patient left: in chair;with call bell/phone within reach;with chair alarm set  OT Visit Diagnosis: Unsteadiness on feet (R26.81);Cognitive communication deficit (R41.841)                Time: 8786-7672 OT Time Calculation (min): 28 min Charges:  OT General Charges $OT Visit: 1 Visit OT Evaluation $OT Eval Moderate Complexity: 1 Mod OT Treatments $Neuromuscular Re-education: 8-22 mins  Nilsa Nutting., OTR/L Acute Rehabilitation Services Pager [phone] Office [phone]   Lucille Passy M 06/04/2020, 8:51 AM

## 2020-06-04 NOTE — Progress Notes (Signed)
I spoke with the patient's sister today while he was having his [place] bx of neck nodes. The MRI scan revealed 5 lesions, possibly two additional subcm lesions as well. I let her know that we would like to gather more data prior to having concrete plans to offer radiotherapy to the brain, including his pathologic diagnosis. Hopefully we will have tissue confirmation by Thursday or Friday. He continues to have confusion per her report and he could not remember that she had spent a good time in the morning with him. She is concerned about quality of life be taken into consideration when it comes to treatment. He has been very clear with her previously that chemotherapy is not something he would consider. That being said his lung function has been so limited, he has also verbalized concerns to her in the past about being worried about dying from his COPD. We discussed the findings in the brain are most likely the cause of his left lower extremity weakness/inability to weight bear. Velta Addison would also like additional specialist input. We have confirmed that Dr. Ander Slade does not feel he has the reserve to tolerate a craniotomy surgery if it were offered in combination with SRS. I have reached out to Dr. Mickeal Skinner in neuro oncology as well who is in agreement to give his opinion about the long term expectations of neurologic recovery both with or without radiotherapy. Currently if he has a NSCLC, he would be a candidate for SRS and would need a 3T MRI scan, versus if he had Small Cell lung cancer or wanted more pallliative style therapy rather than ablative, whole brain radiation could be considered. We will follow back up once we have pathologic confirmation of his suspected lung cancer diagnosis. She is in agreement with this plan.     Carola Rhine, PAC

## 2020-06-04 NOTE — Procedures (Signed)
Interventional Radiology Procedure Note  Procedure: [place] BX RT NECK ADENOPATHY    Complications: None  Estimated Blood Loss:  MIN  Findings: 74 G CORES    Tamera Punt, MD

## 2020-06-04 NOTE — Progress Notes (Signed)
Chaplain engaged in initial visit with Arthur [name], who prefers to be called "Arthur [name]."  Arthur [name] expressed that he has been in the hospital for about 5 days.  He is looking forward to finding out what is happening with his body and being able to make some decisions.  He also expressed that he has support and is local to [place]. Chaplain offered presence, listening and support.    Chaplain will follow-up.    06/04/20 1200  Clinical Encounter Type  Visited With Patient  Visit Type Initial

## 2020-06-04 NOTE — Consult Note (Signed)
Chief Complaint: Patient was seen in consultation today for US guided biopsy of right cervical lymph node Chief Complaint  Patient presents with  . Hyperglycemia    Referring Physician(s): Curcio,K ,NP  Supervising Physician: Daryll Brod  Patient Status: [facility] - In-pt  History of Present Illness: Arthur [name] is a 75 y.o. male ex smoker  with PMH sig for PAF,CKD,COPD, hypothyroidism, melanoma excision, OSA, HTN, DM who presents now with right upper lobe lung mass with significant mediastinal and hilar adenopathy.  In addition, he has findings concerning for adrenal metastasis, bone metastases, and brain metastases. MRI brain done today- results pending. He was originally scheduled for OP rt cervical LN bx on 2/22 but was admitted to [facility] on 2/13 after recent fall, left leg pain and hyperglycemia. Request now received to perform LN bx while IP.  Past Medical History:  Diagnosis Date  . Atrial fibrillation ([place])   . Chronic kidney disease   . COPD (chronic obstructive pulmonary disease) ([place])   . Hypertension   . Type 2 diabetes mellitus ([place])   . Vitiligo     Past Surgical History:  Procedure Laterality Date  . MELANOMA EXCISION      Allergies: Mefloquine hcl  Medications: Prior to Admission medications   Medication Sig Start Date End Date Taking? Authorizing Provider  albuterol (VENTOLIN HFA) 108 (90 Base) MCG/ACT inhaler Inhale 2 puffs into the lungs every 6 (six) hours as needed for wheezing or shortness of breath.   Yes [provider]  ALLOPURINOL PO Take 300 mg by mouth daily.   Yes [provider]  diltiazem (CARDIZEM) 120 MG tablet Take 120 mg by mouth daily.   Yes [provider]  flecainide (TAMBOCOR) 100 MG tablet Take 200 mg by mouth 2 (two) times daily.   Yes [provider]  Fluticasone-Umeclidin-Vilant (TRELEGY ELLIPTA) 200-62.5-25 MCG/INH AEPB Inhale 1 puff into the lungs daily. 05/09/20  Yes Martyn Ehrich, NP   guaiFENesin (MUCINEX) 600 MG 12 hr tablet Take 1,200 mg by mouth 2 (two) times daily.   Yes [provider]  hydrochlorothiazide (MICROZIDE) 12.5 MG capsule Take 12.5 mg by mouth daily.   Yes [provider]  hydroxychloroquine (PLAQUENIL) 200 MG tablet Take 200 mg by mouth daily. 05/15/19  Yes [provider]  insulin aspart (NOVOLOG) 100 UNIT/ML injection Inject into the skin as directed. Via Pump /   Yes [provider]  levothyroxine (SYNTHROID) 88 MCG tablet Take 88 mcg by mouth daily before breakfast.   Yes [provider]  OXYGEN Place into the nose. 4 liters continuously   Yes [provider]  potassium chloride SA (K-DUR) 20 MEQ tablet Take 20 mEq by mouth daily.   Yes [provider]  Respiratory Therapy Supplies (FLUTTER) DEVI 1 Device by Does not apply route as needed. 08/09/19  Yes Tanda Rockers, MD  temazepam (RESTORIL) 30 MG capsule Take 30 mg by mouth at bedtime as needed for sleep.   Yes [provider]  torsemide (DEMADEX) 10 MG tablet Take 10 mg by mouth daily.   Yes [provider]  UNABLE TO FIND Med Name: BIPAP with 3lpm o2   Yes [provider]  doxycycline (VIBRA-TABS) 100 MG tablet Take 1 tablet (100 mg total) by mouth 2 (two) times daily. Patient not taking: Reported on 06/03/2020 04/16/20   Laurin Coder, MD     Family History  Problem Relation Age of Onset  . Stroke Mother   .  Cancer Father     Social History   Socioeconomic History  . Marital status: Single    Spouse name: Not on file  . Number of children: 0  . Years of education: Not on file  . Highest education level: Not on file  Occupational History  . Not on file  Tobacco Use  . Smoking status: Former Smoker    Packs/day: 1.50    Years: 25.00    Pack years: 57.50    Quit date: 04/20/1988    Years since quitting: 32.1  . Smokeless tobacco: Never Used  Vaping Use  . Vaping Use: Never used  Substance  and Sexual Activity  . Alcohol use: Yes  . Drug use: Not on file  . Sexual activity: Not on file  Other Topics Concern  . Not on file  Social History Narrative  . Not on file   Social Determinants of Health   Financial Resource Strain: Not on file  Food Insecurity: Not on file  Transportation Needs: Not on file  Physical Activity: Not on file  Stress: Not on file  Social Connections: Not on file      Review of Systemsdenies fever,HA,CP, abd pain, back pain,N/V or bleeding; he does have chronic dyspnea, cough, left leg pain  Vital Signs: BP (!) 164/69   Pulse 98   Temp (!) 97.2 F (36.2 C) (Axillary)   Resp (!) 21   Ht 5\' 10"  (1.778 m)   Wt 145 lb 15.1 oz (66.2 kg)   SpO2 94%   BMI 20.94 kg/m   Physical Exam awake, answers questions ok, chest- distant BS but few scatt wheezes bilat; heart - currently RRR; bad- soft,+BS,NT; no LE edema  Imaging: CT HEAD WO CONTRAST  Result Date: 06/02/2020 CLINICAL DATA:  [place].  History of metastatic lung cancer. EXAM: CT HEAD WITHOUT CONTRAST TECHNIQUE: Contiguous axial images were obtained from the base of the skull through the vertex without intravenous contrast. COMPARISON:  PET scan 05/21/2020 FINDINGS: Brain: No focal abnormality seen affecting the brainstem or cerebellum. There is a 1 cm mass at the inferior frontal lobe on the right with surrounding edema. There is a necrotic mass in the right frontal lobe measuring 3.3 cm in diameter, with surrounding edema, consistent with a metastasis. There is a necrotic mass at the right parietal vertex measuring up to 2.8 cm in diameter consistent with a metastasis. Some surrounding edema in this location as well. No metastasis to the left hemisphere identified. No hydrocephalus. Right-to-left shift of 2 mm. No extra-axial collection. Vascular: There is atherosclerotic calcification of the major vessels at the base of the brain. Skull: No fracture. No evidence skull or skull base metastatic lesion.  Sinuses/Orbits: Mucosal inflammatory changes of the left maxillary sinus and left ethmoid sinuses. Orbits negative. Other: None IMPRESSION: 1 cm solid mass in the inferior frontal lobe on the right with mild edema. Two necrotic masses in the right hemisphere, located in the right frontal lobe and right parietal vertex consistent with metastases. Surrounding edema. 2 mm of right-to-left shift. No hydrocephalus. No traumatic finding. Non necrotic portions of the metastases are slightly hyperdense, likely indicating the presence of petechial hemorrhage. This hemorrhage probably relates to the biology of the tumor rather than the recent fall. Electronically Signed   By: Nelson Chimes M.D.   On: 06/02/2020 22:23   CT Angio Chest W/Cm &/Or Wo Cm  Result Date: 05/06/2020 CLINICAL DATA:  Positive D-dimer and shortness of breath, mass on recent chest x-ray  with suggestion of central lymphadenopathy. EXAM: CT ANGIOGRAPHY CHEST WITH CONTRAST TECHNIQUE: Multidetector CT imaging of the chest was performed using the standard protocol during bolus administration of intravenous contrast. Multiplanar CT image reconstructions and MIPs were obtained to evaluate the vascular anatomy. CONTRAST:  13mL OMNIPAQUE IOHEXOL 350 MG/ML SOLN COMPARISON:  05/02/2020 FINDINGS: Cardiovascular: Thoracic aorta demonstrates atherosclerotic calcifications. No aneurysmal dilatation or dissection is noted. Scattered coronary calcifications are identified. The pulmonary artery is well visualized without evidence of pulmonary emboli. No cardiac enlargement is noted. Mediastinum/Nodes: Significant mediastinal adenopathy is identified. There are multiple right paratracheal lymph nodes identified. The largest conglomeration of adenopathy measures 5.0 x 3.8 cm. Prevascular node is noted on image number 28 of series 4 measuring 13 mm in short axis. Precarinal adenopathy measures 3.7 x 3.6 cm and appears contiguous with the right paratracheal adenopathy  mass. AP window lymph node on image number 40 of series 4 measures 13 mm in short axis. Right hilar adenopathy measuring 3.6 x 3.6 cm is noted on image number 52. On the same image there is evidence of left hilar lymph node measuring almost 13 mm in short axis. Subcarinal adenopathy is noted which measures at least 15 mm in short axis. Is difficult to discern the esophagus from the adjacent adenopathy which may be result in some under evaluation of size of subcarinal adenopathy. Fullness in the right thoracic inlet is noted portion of which is consistent with adenopathy measuring at least 15 mm in short axis on image number 5 of series 4. The timing of the contrast bolus somewhat limits the evaluation of cervical adenopathy at this time. Lungs/Pleura: Lungs are well aerated and demonstrate diffuse emphysematous changes consistent with the given clinical history. In the right upper lobe there is a multilobulated mass lesion which measures 2.8 cm in greatest transverse diameter and 2.1 cm in greatest AP diameter. It measures approximately 1.4 cm in craniocaudad projection. Scattered smaller subcentimeter nodules are noted throughout the right lung. The largest of these is noted at the junction of the minor and major fissures best seen on image number 87 of series 6 measuring 8.6 mm. Smaller nodules are seen on images 51, 61, 63, 96, 99, 110, 118 and 125 of series 6. In the left lung, multiple nodules are noted. The largest of these measures 12.4 mm best seen on image number 33 of series 6. Scattered smaller nodules are noted on images 17, 21, 27, 40, 49, 60 , 67 and 81 of series 6. Additionally, there is a pleural based nodule which measures 2.2 x 1.0 cm best seen on image number 60 of series 6. Upper Abdomen: Visualized upper abdomen is within normal limits. No definitive mass is seen. No discrete sizable adenopathy is identified at this time. Musculoskeletal: No chest wall abnormality. No acute or significant  osseous findings. Review of the MIP images confirms the above findings. IMPRESSION: Dominant mass lesion in the right upper lobe with multiple parenchymal nodules bilaterally and significant mediastinal and hilar adenopathy bilaterally. 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 No evidence of pulmonary emboli. Aortic Atherosclerosis (ICD10-I70.0) and Emphysema (ICD10-J43.9). Critical Value/emergent results were called by telephone at the time of interpretation on 05/06/2020 at 4:51 pm to [facility], NP , who verbally acknowledged these results. She states she is had conversations with the patient with regards to this diagnosis. Further workup can be determined as  clinically indicated but could include PET-CT imaging and tissue diagnosis Electronically Signed   By: Inez Catalina M.D.   On: 05/06/2020 16:59   NM PET Image Initial (PI) Skull Base To Thigh  Result Date: 05/21/2020 CLINICAL DATA:  Initial treatment strategy for bilateral pulmonary nodules and bulky thoracic adenopathy on recent chest CT. EXAM: NUCLEAR MEDICINE PET SKULL BASE TO THIGH TECHNIQUE: 7.4 mCi F-18 FDG was injected intravenously. Full-ring PET imaging was performed from the skull base to thigh after the radiotracer. CT data was obtained and used for attenuation correction and anatomic localization. Fasting blood glucose: 244 mg/dl COMPARISON:  05/06/2020 chest CT angiogram. FINDINGS: Mediastinal blood pool activity: SUV max 2.3 Liver activity: SUV max NA NECK: Multiple enlarged hypermetabolic lymph nodes throughout the right greater than left neck involving levels II, III, IV and V on the right and level I on the left. Representative 1.4 cm left level Ib node with max SUV 4.8 near the left angle of mandible (series 4/image 33). Representative 1.7 cm right level IIb lymph node with max  SUV 5.5 (series 4/image 32). Representative 1.8 cm right level IV neck lymph node with max SUV 4.5 (series 4/image 41). Representative 1.7 cm right level V neck lymph node with max SUV 5.2 (series 4/image 39). Incidental CT findings: Mucoperiosteal thickening throughout the left greater than right maxillary sinuses and ethmoidal air cells, with no fluid levels. CHEST: Hypermetabolic irregular 3.3 cm anterior right upper lobe lung mass with max SUV 4.5 (series 8/image 20). Numerous (greater than 10) irregular solid pulmonary nodules scattered throughout both lungs, the largest of which demonstrate mild hypermetabolism with representative peripheral left upper lobe 2.2 cm nodule with max SUV 2.7 (series 8/image 33) and representative 0.4 cm peripheral left upper lobe nodule with max SUV 2.0 (series 8/image 19). Mildly enlarged hypermetabolic 1.1 cm left axillary lymph node with max SUV 2.7 (series 4/image 54). No hypermetabolic right axillary nodes. Bulky hypermetabolic bilateral mediastinal adenopathy involving prevascular, AP window, paratracheal and subcarinal chains. Representative 1.6 cm left prevascular node with max SUV 4.5 (series 4/image 60). Representative 1.6 cm right prevascular node with max SUV 5.2 (series 4/image 69). Representative 4.9 cm right paratracheal node with max SUV 4.3 (series 4/image 63). Representative 1.8 cm subcarinal node with max SUV 4.6 (series 4/image 81). Hypermetabolic 2.6 cm right hilar node with max SUV 4.4 (series 4/image 78). No hypermetabolic left hilar nodes. Incidental CT findings: Coronary atherosclerosis. Atherosclerotic nonaneurysmal thoracic aorta. Severe centrilobular emphysema. ABDOMEN/PELVIS: Hypermetabolic 1.9 cm left adrenal nodule with density 21 HU and max SUV 3.0. New hypermetabolic right adrenal nodules. No abnormal hypermetabolic activity within the liver, pancreas or spleen. No hypermetabolic lymph nodes in the abdomen or pelvis. Incidental CT findings:  Nonobstructing 7 mm upper left renal stone. Simple 3.4 cm lower left renal cyst. Atherosclerotic nonaneurysmal abdominal aorta. SKELETON: Hypermetabolic midline sacral lesion with max SUV 4.2 and hypermetabolic L2 vertebral lesion with max SUV 5.1, relatively CT occult. Incidental CT findings: none IMPRESSION: 1. Hypermetabolic irregular 3.3 cm anterior right upper lobe lung mass, compatible with primary bronchogenic carcinoma. 2. Numerous (greater than 10) irregular solid bilateral pulmonary nodules, the largest of which are mildly hypermetabolic, compatible with metastatic disease. 3. Bulky hypermetabolic ipsilateral hilar, bilateral mediastinal and bilateral neck nodal metastases. 4. Hypermetabolic left adrenal metastasis. 5. 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 6. 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 7. Chronic findings include: Aortic Atherosclerosis (ICD10-I70.0) and Emphysema (ICD10-J43.9). Coronary atherosclerosis. Nonobstructing left nephrolithiasis.  Chronic appearing bilateral paranasal sinusitis. Electronically Signed   By: Ilona Sorrel M.D.   On: 05/21/2020 14:06   DG Chest Portable 1 View  Result Date: 06/02/2020 CLINICAL DATA:  Shortness of breath.  COPD.  Lung cancer suspected. EXAM: PORTABLE CHEST 1 VIEW COMPARISON:  05/02/2020 radiography.  PET scan 05/21/2020 FINDINGS: Heart size is normal. Right hilar adenopathy and right paratracheal adenopathy have worsened. Mass lesion in the right upper lobe consistent with a malignancy shown by PET scan. 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 No consolidation, collapse or effusion. No focal bone lesion. IMPRESSION: Worsened right hilar and right paratracheal adenopathy. Mass lesion in the right upper lobe consistent with a malignancy shown by PET scan. Other scattered pulmonary densities consistent with intrapulmonary metastatic disease also better  shown by PET. No other acute process. Electronically Signed   By: Nelson Chimes M.D.   On: 06/02/2020 22:16    Labs:  CBC: Recent Labs    05/02/20 1256 06/02/20 2224 06/02/20 2233 06/03/20 0307 06/04/20 0237  WBC 9.2 12.8*  --  12.2* 11.6*  HGB 16.1 14.0 16.0  16.0 12.6* 13.8  HCT 49.4 42.8 47.0  47.0 37.9* 42.4  PLT 268.0 281  --  261 265    COAGS: Recent Labs    06/02/20 2224  INR 1.1  APTT 32    BMP: Recent Labs    05/02/20 1256 06/02/20 2210 06/02/20 2233 06/02/20 2310 06/03/20 0307 06/04/20 0237  NA 138  --  132*  132* 137 141 140  K 4.1   < > 7.8*  7.8* 4.3 3.8 4.8  CL 96  --  99  99 98 102 98  CO2 34*  --   --  28 31 33*  GLUCOSE 215*  --  426*  426* 419* 211* 435*  BUN 32*  --  59*  59* 35* 30* 30*  CALCIUM 9.3  --   --  8.9 8.7* 9.0  CREATININE 1.05  --  0.80  0.80 0.99 0.68 0.72  GFRNONAA  --   --   --  >60 >60 >60   < > = values in this interval not displayed.    LIVER FUNCTION TESTS: Recent Labs    06/02/20 2310 06/04/20 0237  BILITOT 1.2 0.8  AST 24 21  ALT 15 15  ALKPHOS 117 103  PROT 6.5 6.0*  ALBUMIN 3.8 3.3*    TUMOR MARKERS: No results for input(s): AFPTM, CEA, CA199, CHROMGRNA in the last 8760 hours.  Assessment and Plan: 75 y.o. male ex smoker  with PMH sig for PAF,CKD,COPD, hypothyroidism, melanoma excision, OSA, HTN, DM who presents now with right upper lobe lung mass with significant mediastinal and hilar adenopathy.  In addition, he has findings concerning for adrenal metastasis, bone metastases, and brain metastases. MRI brain done today- results pending. He was originally scheduled for OP rt cervical LN bx on 2/22 but was admitted to [facility] on 2/13 after recent fall, left leg pain and hyperglycemia. Request now received to perform LN bx while IP.Risks and benefits of procedure was discussed with the patient  including, but not limited to bleeding, infection, damage to adjacent structures or low yield requiring additional  tests.  All of the questions were answered and there is agreement to proceed.  Consent signed and in chart.     Thank you for this interesting consult.  I greatly enjoyed meeting Courtenay Creger and look forward to participating in  their care.  A copy of this report was sent to the requesting provider on this date.  Electronically Signed: D. Rowe Robert, PA-C 06/04/2020, 11:02 AM   I spent a total of 25 minutes  in face to face in clinical consultation, greater than 50% of which was counseling/coordinating care for US guided right cervical lymph node biopsy

## 2020-06-05 DIAGNOSIS — C799 Secondary malignant neoplasm of unspecified site: Secondary | ICD-10-CM

## 2020-06-05 DIAGNOSIS — I10 Essential (primary) hypertension: Secondary | ICD-10-CM

## 2020-06-05 DIAGNOSIS — E890 Postprocedural hypothyroidism: Secondary | ICD-10-CM

## 2020-06-05 DIAGNOSIS — Z515 Encounter for palliative care: Secondary | ICD-10-CM | POA: Diagnosis not present

## 2020-06-05 DIAGNOSIS — J9621 Acute and chronic respiratory failure with hypoxia: Secondary | ICD-10-CM | POA: Diagnosis present

## 2020-06-05 DIAGNOSIS — I480 Paroxysmal atrial fibrillation: Secondary | ICD-10-CM

## 2020-06-05 DIAGNOSIS — Z7189 Other specified counseling: Secondary | ICD-10-CM

## 2020-06-05 DIAGNOSIS — J9611 Chronic respiratory failure with hypoxia: Secondary | ICD-10-CM | POA: Diagnosis not present

## 2020-06-05 DIAGNOSIS — C7931 Secondary malignant neoplasm of brain: Secondary | ICD-10-CM | POA: Diagnosis not present

## 2020-06-05 LAB — COMPREHENSIVE METABOLIC PANEL
ALT: 17 U/L (ref 0–44)
AST: 21 U/L (ref 15–41)
Albumin: 3.6 g/dL (ref 3.5–5.0)
Alkaline Phosphatase: 108 U/L (ref 38–126)
Anion gap: 12 (ref 5–15)
BUN: 39 mg/dL — ABNORMAL HIGH (ref 8–23)
CO2: 33 mmol/L — ABNORMAL HIGH (ref 22–32)
Calcium: 9.4 mg/dL (ref 8.9–10.3)
Chloride: 99 mmol/L (ref 98–111)
Creatinine, Ser: 0.85 mg/dL (ref 0.61–1.24)
GFR, Estimated: >60 mL/min (ref >60)
Glucose, Bld: 229 mg/dL — ABNORMAL HIGH (ref 70–99)
Potassium: 4.4 mmol/L (ref 3.5–5.1)
Sodium: 144 mmol/L (ref 135–145)
Total Bilirubin: 0.8 mg/dL (ref 0.3–1.2)
Total Protein: 6.4 g/dL — ABNORMAL LOW (ref 6.5–8.1)

## 2020-06-05 LAB — CBC WITH DIFFERENTIAL/PLATELET
Abs Immature Granulocytes: 0.09 10*3/uL — ABNORMAL HIGH (ref 0.00–0.07)
Basophils Absolute: 0 10*3/uL (ref 0.0–0.1)
Basophils Relative: 0 %
Eosinophils Absolute: 0 10*3/uL (ref 0.0–0.5)
Eosinophils Relative: 0 %
HCT: 45.9 % (ref 39.0–52.0)
Hemoglobin: 14.9 g/dL (ref 13.0–17.0)
Immature Granulocytes: 1 %
Lymphocytes Relative: 3 %
Lymphs Abs: 0.5 10*3/uL — ABNORMAL LOW (ref 0.7–4.0)
MCH: 31.2 pg (ref 26.0–34.0)
MCHC: 32.5 g/dL (ref 30.0–36.0)
MCV: 96 fL (ref 80.0–100.0)
Monocytes Absolute: 0.5 10*3/uL (ref 0.1–1.0)
Monocytes Relative: 3 %
Neutro Abs: 13 10*3/uL — ABNORMAL HIGH (ref 1.7–7.7)
Neutrophils Relative %: 93 %
Platelets: 314 10*3/uL (ref 150–400)
RBC: 4.78 MIL/uL (ref 4.22–5.81)
RDW: 16 % — ABNORMAL HIGH (ref 11.5–15.5)
WBC: 14 10*3/uL — ABNORMAL HIGH (ref 4.0–10.5)
nRBC: 0 % (ref 0.0–0.2)

## 2020-06-05 LAB — GLUCOSE, CAPILLARY
Glucose-Capillary: 306 mg/dL — ABNORMAL HIGH (ref 70–99)
Glucose-Capillary: 253 mg/dL — ABNORMAL HIGH (ref 70–99)
Glucose-Capillary: 368 mg/dL — ABNORMAL HIGH (ref 70–99)
Glucose-Capillary: 211 mg/dL — ABNORMAL HIGH (ref 70–99)

## 2020-06-05 MED ORDER — INSULIN ASPART 100 UNIT/ML ~~LOC~~ SOLN
6.0000 [IU] | Freq: Three times a day (TID) | SUBCUTANEOUS | Status: DC
  Administered 2020-06-05 – 2020-06-08 (×10): 6 [IU] via SUBCUTANEOUS

## 2020-06-05 MED ORDER — INSULIN GLARGINE 100 UNIT/ML ~~LOC~~ SOLN
30.0000 [IU] | Freq: Every day | SUBCUTANEOUS | Status: DC
  Administered 2020-06-06 – 2020-06-07 (×2): 30 [IU] via SUBCUTANEOUS
  Filled 2020-06-05 (×2): qty 0.3

## 2020-06-05 MED ORDER — INSULIN GLARGINE 100 UNIT/ML ~~LOC~~ SOLN
5.0000 [IU] | Freq: Once | SUBCUTANEOUS | Status: AC
  Administered 2020-06-05: 5 [IU] via SUBCUTANEOUS
  Filled 2020-06-05: qty 0.05

## 2020-06-05 MED ORDER — IPRATROPIUM-ALBUTEROL 0.5-2.5 (3) MG/3ML IN SOLN
3.0000 mL | Freq: Three times a day (TID) | RESPIRATORY_TRACT | Status: DC
  Administered 2020-06-05 – 2020-06-06 (×3): 3 mL via RESPIRATORY_TRACT
  Filled 2020-06-05 (×3): qty 3

## 2020-06-05 MED ORDER — HYDRALAZINE HCL 20 MG/ML IJ SOLN: 10.0000 mg | Freq: Four times a day (QID) | INTRAMUSCULAR | Status: DC | PRN

## 2020-06-05 NOTE — TOC Progression Note (Signed)
Transition of Care [facility]) - Progression Note    Patient Details  Name: Arthur [name] MRN: [id number] Date of Birth: [birth year]  Transition of Care [facility] CM/SW Contact  Leeroy Cha, RN Phone Number: 06/05/2020, 1:53 PM  Clinical Narrative:    fl2 prepared but not sent out final plan is not decided on. Passar NUmber =[phone] A WGN:[id number] Will await to see palliative care outcome.   Expected Discharge Plan: [place] Barriers to Discharge: Continued Medical Work up  Expected Discharge Plan and Services Expected Discharge Plan: [place]   Discharge Planning Services: CM Consult Post Acute Care Choice: [place] Living arrangements for the past 2 months: Single Family Home                                       Social Determinants of Health (SDOH) Interventions    Readmission Risk Interventions No flowsheet data found.

## 2020-06-05 NOTE — Progress Notes (Signed)
Pt continues to refuse BiPAP qhs.  Machine remains in room in case Pt changes his mind.

## 2020-06-05 NOTE — Progress Notes (Signed)
PROGRESS NOTE    Arthur [name]  HGD:[id number] DOB: [birth year] DOA: 06/02/2020 PCP: Bernerd Limbo, MD    Chief Complaint  Patient presents with  . 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 Ander Slade pulmonology Positive Cologuard-refusing work-up and 02/2019 Found to have right upper lobe lung mass after office visit January-not a great candidate for bronchoscopy given severe emphysema risk of pneumothorax etc. PET scan showed metastatic disease to left adrenal and bone in addition to nodules Presented to emergency room 2/13 secondary to fall causing insulin pump to fall out and sugars were in the 500 range Also stated weakness, inability to stand, being down on the ground CT head =1 cm solid mass inferior frontal lobe on the right side with edema and necrotic masses in the right hemisphere with surrounding edema with 2 mm right to left shift Oncology consulted patient to start Decadron Neurosurgeon Dr. Glenford Peers consulted did not advise any further imaging Hyperglycemia aggressively treated with IV insulin initially and transitioned Going for lymph node biopsies will need skilled placement   Assessment & Plan:   Principal Problem:   Brain metastases ([place]) Active Problems:   Acute on chronic respiratory failure with hypoxia ([place])   Chronic respiratory failure with hypoxia ([facility])   Hyperglycemia   Hyperkalemia   PAF (paroxysmal atrial fibrillation) (H[facility])   HTN (hypertension)   Hypothyroidism   Protein calorie malnutrition ([facility])   Malnutrition of moderate degree  1 right upper lobe lung mass with mediastinal and hilar adenopathy with probable adrenal mets, bone mets, brain metastases Patient presented with ataxia with difficulty standing.  Work-up done including head CT  and MRI brain concerning for metastatic disease.  Concern for metastatic lung cancer.  Patient noted to be evaluated in the outpatient setting by pulmonary medicine and PET scan was performed May 21, 2020 with findings compatible with primary bronchogenic carcinoma with findings concerning for adrenal mets, bone mets.  Patient started on Decadron.  Patient seen in consultation by radiation oncology as well as medical oncology who recommended CT-guided biopsy of one of his lymph nodes per IR which was done on 06/04/2020 with results pending.  Radiation oncology/medical oncology following and management per oncology team.  We will also consulted palliative care for goals of care.  Follow.  2.  Acute on chronic respiratory failure secondary to severe COPD on 3 L nasal cannula at baseline Patient currently on 6 L nasal cannula with sats around 90%.  Worsening respiratory status likely secondary to metastatic lung cancer.  Biopsies pending.  Patient with some complaints of worsening shortness of breath from his baseline.  Continue current regimen of Mucinex, Decadron, Breo Ellipta.  Placed on scheduled duo nebs.  Supportive care.  Follow.  3.  Paroxysmal atrial fibrillation Continue Cardizem and Tambocor for rate control.  Patient noted not on anticoagulation.  Follow.  4.  Type 2 diabetes mellitus on insulin pump at home Patient noted to have pump not functioning well.  On presentation patient noted to be hypoglycemic requiring insulin drip and have subsequently been transitioned to subcutaneous Lantus, sliding scale insulin.  Patient with elevated CBGs likely elevated in part by steroids.  CBG at 306 this morning.  Increase Lantus to 30 units daily.  Increase meal coverage NovoLog to 6 units 3 times daily with  meals.  Continue sliding scale insulin.  Diabetes coordinator following.  Follow.  5.  Hypothyroidism Continue home regimen Synthroid.  6.  Probable colon cancer Patient noted with concerns for  possible colon cancer.  Patient will be a poor candidate for further work-up at this time.  Outpatient follow-up.  7.  Prior history of malignant melanoma Per oncology.  8.  Hypertension Continue Cardizem and HCTZ.  Hydralazine as needed.    DVT prophylaxis: SCDs Code Status: DNR Family Communication: Updated patient and sister at bedside. Disposition:   Status is: Inpatient    Dispo: The patient is from: Home              Anticipated d/c is to: Home with home health versus SNF              Anticipated d/c date is: To be determined.              Patient currently undergoing work-up for metastatic lung disease, on 6 L nasal cannula.  Not stable for discharge   Difficult to place patient undetermined.       Consultants:   Palliative care pending  Interventional radiology: Dr. Annamaria Boots 06/04/2020  Radiation oncology: Worthy Flank, PA 06/03/2020  Medical oncology: Dr. Lorna Few 06/03/2020  Procedures:  CT Head 06/02/2020  Chest x-ray 06/02/2020  MRI brain 06/04/2020  Ultrasound-guided core biopsy of right cervical adenopathy per IR, Dr. Annamaria Boots 06/04/2020  Antimicrobials:   None   Subjective: Patient sitting up in bed.  Some complaints of shortness of breath which he states is not his baseline.  Denies any chest pain.  No abdominal pain.  Currently on 6 L with sats of 90%.  Sister at bedside.  Objective: Vitals:   06/05/20 1300 06/05/20 1400 06/05/20 1500 06/05/20 1600  BP: 124/66 132/70 134/76 133/61  Pulse: 66   79  Resp: 13 19 (!) 21 19  Temp:    98 F (36.7 C)  TempSrc:    Oral  SpO2: 95%   99%  Weight:      Height:        Intake/Output Summary (Last 24 hours) at 06/05/2020 1650 Last data filed at 06/05/2020 1300 Gross per 24 hour  Intake --  Output 950 ml  Net -950 ml   Filed Weights   06/02/20 2146  Weight: 66.2 kg    Examination:  General exam: NAD Respiratory system: Scattered coarse breath sounds diffusely.  Decreased breath  sounds in the bases.  No significant wheezing noted.   Cardiovascular system: Irregularly irregular. No JVD.  No lower extremity edema.  Gastrointestinal system: Abdomen is nondistended, soft and nontender. No organomegaly or masses felt. Normal bowel sounds heard. Central nervous system: Alert and oriented x 3. No focal neurological deficits. Extremities: Symmetric 5 x 5 power. Skin: No rashes, lesions or ulcers Psychiatry: Judgement and insight appear normal. Mood & affect appropriate.     Data Reviewed: I have personally reviewed following labs and imaging studies  CBC: Recent Labs  Lab 06/02/20 2224 06/02/20 2233 06/03/20 0307 06/04/20 0237 06/05/20 0250  WBC 12.8*  --  12.2* 11.6* 14.0*  NEUTROABS 10.2*  --   --  10.5* 13.0*  HGB 14.0 16.0  16.0 12.6* 13.8 14.9  HCT 42.8 47.0  47.0 37.9* 42.4 45.9  MCV 95.7  --  96.7 95.7 96.0  PLT 281  --  261 265 032    Basic Metabolic Panel: Recent Labs  Lab 06/02/20 2310 06/03/20 0307 06/04/20 0237 06/04/20  1217 06/05/20 0250  NA 137 141 140 139 144  K 4.3 3.8 4.8 4.7 4.4  CL 98 102 98 97* 99  CO2 28 31 33* 32 33*  GLUCOSE 419* 211* 435* 482* 229*  BUN 35* 30* 30* 33* 39*  CREATININE 0.99 0.68 0.72 0.83 0.85  CALCIUM 8.9 8.7* 9.0 9.2 9.4    GFR: Estimated Creatinine Clearance: 73.6 mL/min (by C-G formula based on SCr of 0.85 mg/dL).  Liver Function Tests: Recent Labs  Lab 06/02/20 2310 06/04/20 0237 06/05/20 0250  AST 24 21 21   ALT 15 15 17   ALKPHOS 117 103 108  BILITOT 1.2 0.8 0.8  PROT 6.5 6.0* 6.4*  ALBUMIN 3.8 3.3* 3.6    CBG: Recent Labs  Lab 06/04/20 1655 06/04/20 2215 06/05/20 0759 06/05/20 1216 06/05/20 1645  GLUCAP 189* 116* 306* 253* 368*     Recent Results (from the past 240 hour(s))  Resp Panel by RT-PCR (Flu A&B, Covid) Nasopharyngeal Swab     Status: None   Collection Time: 06/02/20 11:22 PM   Specimen: Nasopharyngeal Swab; Nasopharyngeal(NP) swabs in vial transport medium  Result  Value Ref Range Status   SARS Coronavirus 2 by RT PCR NEGATIVE NEGATIVE Final    Comment: (NOTE) SARS-CoV-2 target nucleic acids are NOT DETECTED.  The SARS-CoV-2 RNA is generally detectable in upper respiratory specimens during the acute phase of infection. The lowest concentration of SARS-CoV-2 viral copies this assay can detect is 138 copies/mL. A negative result does not preclude SARS-Cov-2 infection and should not be used as the sole basis for treatment or other patient management decisions. A negative result may occur with  improper specimen collection/handling, submission of specimen other than nasopharyngeal swab, presence of viral mutation(s) within the areas targeted by this assay, and inadequate number of viral copies(<138 copies/mL). A negative result must be combined with clinical observations, patient history, and epidemiological information. The expected result is Negative.  Fact Sheet for Patients:  EntrepreneurPulse.com.au  Fact Sheet for Healthcare Providers:  IncredibleEmployment.be  This test is no t yet approved or cleared by the [place] FDA and  has been authorized for detection and/or diagnosis of SARS-CoV-2 by FDA under an Emergency Use Authorization (EUA). This EUA will remain  in effect (meaning this test can be used) for the duration of the COVID-19 declaration under Section 564(b)(1) of the Act, 21 U.S.C.section 360bbb-3(b)(1), unless the authorization is terminated  or revoked sooner.       Influenza A by PCR NEGATIVE NEGATIVE Final   Influenza B by PCR NEGATIVE NEGATIVE Final    Comment: (NOTE) The Xpert Xpress SARS-CoV-2/FLU/RSV plus assay is intended as an aid in the diagnosis of influenza from Nasopharyngeal swab specimens and should not be used as a sole basis for treatment. Nasal washings and aspirates are unacceptable for Xpert Xpress SARS-CoV-2/FLU/RSV testing.  Fact Sheet for  Patients: EntrepreneurPulse.com.au  Fact Sheet for Healthcare Providers: IncredibleEmployment.be  This test is not yet approved or cleared by the [place] FDA and has been authorized for detection and/or diagnosis of SARS-CoV-2 by FDA under an Emergency Use Authorization (EUA). This EUA will remain in effect (meaning this test can be used) for the duration of the COVID-19 declaration under Section 564(b)(1) of the Act, 21 U.S.C. section 360bbb-3(b)(1), unless the authorization is terminated or revoked.  Performed at [facility], [place] [address] [place], [place] [address]   MRSA PCR Screening     Status: None   Collection Time: 06/03/20  8:41  AM   Specimen: Nasal Mucosa; Nasopharyngeal  Result Value Ref Range Status   MRSA by PCR NEGATIVE NEGATIVE Final    Comment:        The GeneXpert MRSA Assay (FDA approved for NASAL specimens only), is one component of a comprehensive MRSA colonization surveillance program. It is not intended to diagnose MRSA infection nor to guide or monitor treatment for MRSA infections. Performed at [facility], [place] [address], [place], [place] [address]          Radiology Studies: MR BRAIN W WO CONTRAST  Result Date: 06/04/2020 CLINICAL DATA:  Metastatic lung cancer staging. EXAM: MRI HEAD WITHOUT AND WITH CONTRAST TECHNIQUE: Multiplanar, multiecho pulse sequences of the brain and surrounding structures were obtained without and with intravenous contrast. CONTRAST:  108mL GADAVIST GADOBUTROL 1 MMOL/ML IV SOLN COMPARISON:  Head CT from 2 days ago FINDINGS: Brain: Progressively enhancing brain masses consistent with metastatic disease: 1. 9 mm in the right frontal lobe on 16:29 2. 3.4 cm in the higher right frontal lobe on 16:35 3. 9 mm in the parasagittal left frontal lobe on 16:41 4. 9 mm along the right frontal parietal junction measured on 16:42 5. 2.7 cm in the  parasagittal right frontal parietal cortex measured on 16:40. The enhancing area was measured on axial slices. Equivocal subcentimeter nodule in the upper vermis seen on sagittal postcontrast imaging but not clearly visualized on axial slices and at a level not covered on coronal postcontrast imaging. There is a small nodular area along the posterior left temporal lobe on axial slices, 00:17, not seen on the other sequences. The metastases show variable amounts of blood products. Moderate vasogenic edema in the right cerebral hemisphere adjacent to the largest metastases. Vascular: Normal flow voids and vascular enhancements. Skull and upper cervical spine: No focal marrow lesion. Sinuses/Orbits: Negative for mass. Mucosal thickening in the left maxillary sinus. IMPRESSION: At least 5 brain metastases with the largest lesion showing blood products and moderate vasogenic edema. Potential for 2 additional subcentimeter metastases, certainty limited by relatively hypoenhancing characteristics and patient motion. Electronically Signed   By: Monte Fantasia M.D.   On: 06/04/2020 11:13   [place] CORE BIOPSY (LYMPH NODES)  Result Date: 06/04/2020 INDICATION: Suspect metastatic lung cancer, right cervical adenopathy EXAM: ULTRASOUND GUIDED CORE BIOPSY OF RIGHT CERVICAL ADENOPATHY MEDICATIONS: 1% LIDOCAINE LOCAL ANESTHESIA/SEDATION: Versed 1.0mg  IV; Fentanyl 90mcg IV; Moderate Sedation Time:  10 MINUTES The patient was continuously monitored during the procedure by the interventional radiology nurse under my direct supervision. FLUOROSCOPY TIME:  Fluoroscopy Time: None. COMPLICATIONS: None immediate. PROCEDURE: The procedure, risks, benefits, and alternatives were explained to the patient. Questions regarding the procedure were encouraged and answered. The patient understands and consents to the procedure. Previous imaging reviewed. Preliminary ultrasound performed. Abnormal bulky right cervical adenopathy localized and  marked. Under sterile conditions and local anesthesia, 18 gauge core biopsy advanced under ultrasound into the lymph nodes. 4 18 gauge core biopsies obtained. These were intact and non fragmented. Samples placed in saline. Needle removed. Postprocedure imaging demonstrates no hemorrhage or hematoma. Patient tolerated biopsy well. FINDINGS: Imaging confirms needle placed in the right cervical adenopathy for core biopsy. IMPRESSION: Successful ultrasound right cervical adenopathy 18 gauge core biopsies Electronically Signed   By: Jerilynn Mages.  Shick M.D.   On: 06/04/2020 16:53        Scheduled Meds: . (feeding supplement) PROSource Plus  30 mL Oral Daily  . allopurinol  300 mg Oral Daily  . chlorhexidine  15 mL Mouth Rinse BID  . Chlorhexidine Gluconate Cloth  6 each Topical Daily  . dexamethasone (DECADRON) injection  10 mg Intravenous Daily  . diltiazem  120 mg Oral Daily  . feeding supplement (GLUCERNA SHAKE)  237 mL Oral BID BM  . flecainide  200 mg Oral BID  . fluticasone furoate-vilanterol  1 puff Inhalation Daily  . guaiFENesin  1,200 mg Oral BID  . hydrochlorothiazide  12.5 mg Oral Daily  . hydroxychloroquine  200 mg Oral Daily  . insulin aspart  0-20 Units Subcutaneous TID WC  . insulin aspart  4 Units Subcutaneous TID WC  . insulin glargine  25 Units Subcutaneous Daily  . ipratropium-albuterol  3 mL Nebulization TID  . levothyroxine  88 mcg Oral Q0600  . mouth rinse  15 mL Mouth Rinse q12n4p   Continuous Infusions:   LOS: 3 days    Time spent: 40 minutes    Irine Seal, MD [facility]   To contact the attending provider between 7A-7P or the covering provider during after hours 7P-7A, please log into the web site [email or web address] and access using universal [place] password for that web site. If you do not have the password, please call the hospital operator.  06/05/2020, 4:50 PM

## 2020-06-05 NOTE — NC FL2 (Signed)
[place] LEVEL OF CARE SCREENING TOOL     IDENTIFICATION  Patient Name: Arthur [name] Birthdate: 1945/08/13 Sex: male Admission Date (Current Location): 06/02/2020  [facility] [place] Number:  Herbalist and Address:  [facility],  [place], [place]      Provider Number: [id number]  Attending Physician Name and Address:  Eugenie Filler, MD  Relative Name and Phone Number:       Current Level of Care: Hospital Recommended Level of Care: [place] Prior Approval Number:    Date Approved/Denied:   PASRR Number: [id number]  Discharge Plan: SNF    Current Diagnoses: Patient Active Problem List   Diagnosis Date Noted  . Malnutrition of moderate degree 06/04/2020  . Hyperglycemia 06/03/2020  . Hyperkalemia 06/03/2020  . PAF (paroxysmal atrial fibrillation) ([place]) 06/03/2020  . HTN (hypertension) 06/03/2020  . Hypothyroidism 06/03/2020  . Protein calorie malnutrition ([place]) 06/03/2020  . Brain metastases ([place]) 06/02/2020  . Hemoptysis 05/07/2020  . OSA treated with BiPAP 05/07/2020  . Chronic respiratory failure with hypoxia ([place]) 11/15/2018  . COPD GOLD IV  11/14/2018    Orientation RESPIRATION BLADDER Height & Weight     Self,Time,Situation,Place  Normal Continent Weight: 66.2 kg Height:  5\' 10"  (177.8 cm)  BEHAVIORAL SYMPTOMS/MOOD NEUROLOGICAL BOWEL NUTRITION STATUS      Continent Diet (regular)  AMBULATORY STATUS COMMUNICATION OF NEEDS Skin   Extensive Assist Verbally Normal                       Personal Care Assistance Level of Assistance  Bathing,Feeding,Dressing Bathing Assistance: Limited assistance Feeding assistance: Limited assistance Dressing Assistance: Limited assistance     Functional Limitations Info  Sight,Hearing,Speech Sight Info: Adequate Hearing Info: Adequate Speech Info: Adequate    SPECIAL CARE FACTORS FREQUENCY  PT (By licensed PT),OT (By licensed  OT)     PT Frequency: 5 x weekly OT Frequency: 5 x weekly            Contractures Contractures Info: Not present    Additional Factors Info  Code Status Code Status Info: DNR             Current Medications (06/05/2020):  This is the current hospital active medication list Current Facility-Administered Medications  Medication Dose Route Frequency Provider Last Rate Last Admin  . (feeding supplement) PROSource Plus liquid 30 mL  30 mL Oral Daily Nita Sells, MD      . allopurinol (ZYLOPRIM) tablet 300 mg  300 mg Oral Daily Tu, Ching T, DO   300 mg at 06/05/20 0907  . chlorhexidine (PERIDEX) 0.12 % solution 15 mL  15 mL Mouth Rinse BID Nita Sells, MD   15 mL at 06/05/20 0909  . Chlorhexidine Gluconate Cloth 2 % PADS 6 each  6 each Topical Daily Nita Sells, MD   6 each at 06/05/20 1341  . dexamethasone (DECADRON) injection 10 mg  10 mg Intravenous Daily Tu, Ching T, DO   10 mg at 06/05/20 0908  . dextrose 50 % solution 0-50 mL  0-50 mL Intravenous PRN Noemi Chapel, MD      . diltiazem (CARDIZEM) tablet 120 mg  120 mg Oral Daily Tu, Ching T, DO   120 mg at 06/05/20 0805  . feeding supplement (GLUCERNA SHAKE) (GLUCERNA SHAKE) liquid 237 mL  237 mL Oral BID BM Nita Sells, MD   237 mL at 06/05/20 0909  . flecainide (TAMBOCOR) tablet 200  mg  200 mg Oral BID Tu, Ching T, DO   200 mg at 06/05/20 0907  . fluticasone furoate-vilanterol (BREO ELLIPTA) 100-25 MCG/INH 1 puff  1 puff Inhalation Daily Tu, Ching T, DO   1 puff at 06/05/20 0809  . guaiFENesin (MUCINEX) 12 hr tablet 1,200 mg  1,200 mg Oral BID Tu, Ching T, DO   1,200 mg at 06/05/20 0907  . hydrALAZINE (APRESOLINE) injection 10 mg  10 mg Intravenous Q6H PRN Eugenie Filler, MD      . hydrochlorothiazide (MICROZIDE) capsule 12.5 mg  12.5 mg Oral Daily Tu, Ching T, DO   12.5 mg at 06/05/20 0805  . hydroxychloroquine (PLAQUENIL) tablet 200 mg  200 mg Oral Daily Tu, Ching T, DO   200 mg at  06/05/20 0907  . insulin aspart (novoLOG) injection 0-20 Units  0-20 Units Subcutaneous TID WC Nita Sells, MD   11 Units at 06/05/20 1249  . insulin aspart (novoLOG) injection 4 Units  4 Units Subcutaneous TID WC Nita Sells, MD   4 Units at 06/05/20 1249  . insulin glargine (LANTUS) injection 25 Units  25 Units Subcutaneous Daily Nita Sells, MD   25 Units at 06/05/20 1020  . ipratropium-albuterol (DUONEB) 0.5-2.5 (3) MG/3ML nebulizer solution 3 mL  3 mL Nebulization TID Eugenie Filler, MD      . levothyroxine (SYNTHROID) tablet 88 mcg  88 mcg Oral Q0600 Tu, Ching T, DO   88 mcg at 06/05/20 6433  . MEDLINE mouth rinse  15 mL Mouth Rinse q12n4p Nita Sells, MD   15 mL at 06/05/20 1249  . temazepam (RESTORIL) capsule 30 mg  30 mg Oral QHS PRN Tu, Ching T, DO   30 mg at 06/04/20 2231     Discharge Medications: Please see discharge summary for a list of discharge medications.  Relevant Imaging Results:  Relevant Lab Results:   Additional Information SSN: [id number]  Leeroy Cha, RN

## 2020-06-05 NOTE — Consult Note (Signed)
Consultation Note Date: 06/05/2020   Patient Name: Arthur [name]  DOB: [birth year]  MRN: [id number]  Age / Sex: 75 y.o., male  PCP: Bernerd Limbo, MD Referring Physician: Eugenie Filler, MD  Reason for Consultation: Establishing goals of care  HPI/Patient Profile: 75 y.o. male   admitted on 06/02/2020 with  .   Clinical Assessment and Goals of Care: 75 year old gentleman who lives here in [place], his sister helps him make decisions, he has history of COPD for which he wears 4 l of oxygen at home.  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  Patient admitted with fall and hyperglycemia, found to have difficulty with ambulation and recent falls and lower extremity weakness along with elevated blood sugars.  Upon brain imaging patient found to have 1 cm solid mass inferior frontal lobe and 2 necrotic masses also in the right hemisphere with surrounding edema.  Patient started on steroids.  Patient found to have pulmonary nodule on chest imaging.  Patient also found to have significant mediastinal and hilar adenopathy with findings likely representing primary pulmonary neoplasm with associated metastatic disease.  Patient has been seen and evaluated by medical oncology, radiation oncology.  At this time, he has undergone cervical lymph node biopsy, results are pending.  Palliative consult for additional support, ongoing goals of care discussions has been requested.  Patient is awake alert resting in bed.  He does complain of episodic dyspnea that comes and goes.  At present denies shortness of breath and pain.  He is aware of the nature of this hospitalization.  He is aware of next steps: Awaiting biopsy results, possible treatment plan to be decided based on further biopsy results.  I introduced myself and  palliative care as follows:   Palliative medicine is specialized medical care for people living with serious illness. It focuses on providing relief from the symptoms and stress of a serious illness. The goal is to improve quality of life for both the patient and the family.  Goals of care: Broad aims of medical therapy in relation to the patient's values and preferences. Our aim is to provide medical care aimed at enabling patients to achieve the goals that matter most to them, given the circumstances of their particular medical situation and their constraints.   Goals wishes and values important to the patient attempted to be explored.  He is agreeable to his sister being his healthcare power of attorney agent.  He states that he does not have a lot in the way of uncontrolled symptoms.  He is fully aware of the serious nature of this hospitalization.  See below.  NEXT OF KIN Sister  SUMMARY OF RECOMMENDATIONS   Agree with DO NOT RESUSCITATE Continue current mode of care, await biopsy results, oncology radiation oncology following. Recommend skilled nursing facility with rehabilitation along with palliative services following towards the end of this hospitalization. Code Status/Advance Care Planning:  DNR    Symptom Management:   As above.  Patient on steroids.  Palliative Prophylaxis:   Delirium Protocol   Psycho-social/Spiritual:   Desire for further Chaplaincy support:yes  Additional Recommendations: Caregiving  Support/Resources  Prognosis:   Unable to determine  Discharge Planning: To Be Determined      Primary Diagnoses: Present on Admission: . Brain metastases ([place]) . Chronic respiratory failure with hypoxia ([place])   I have reviewed the medical record, interviewed the patient and family, and examined the patient. The following aspects are pertinent.  Past Medical History:  Diagnosis Date  . Atrial fibrillation ([place])   . Chronic kidney disease   . COPD  (chronic obstructive pulmonary disease) ([place])   . Hypertension   . Type 2 diabetes mellitus ([place])   . Vitiligo    Social History   Socioeconomic History  . Marital status: Single    Spouse name: Not on file  . Number of children: 0  . Years of education: Not on file  . Highest education level: Not on file  Occupational History  . Not on file  Tobacco Use  . Smoking status: Former Smoker    Packs/day: 1.50    Years: 25.00    Pack years: 39.50    Quit date: 04/20/1988    Years since quitting: 32.1  . Smokeless tobacco: Never Used  Vaping Use  . Vaping Use: Never used  Substance and Sexual Activity  . Alcohol use: Yes  . Drug use: Not on file  . Sexual activity: Not on file  Other Topics Concern  . Not on file  Social History Narrative  . Not on file   Social Determinants of Health   Financial Resource Strain: Not on file  Food Insecurity: Not on file  Transportation Needs: Not on file  Physical Activity: Not on file  Stress: Not on file  Social Connections: Not on file   Family History  Problem Relation Age of Onset  . Stroke Mother   . Cancer Father    Scheduled Meds: . (feeding supplement) PROSource Plus  30 mL Oral Daily  . allopurinol  300 mg Oral Daily  . chlorhexidine  15 mL Mouth Rinse BID  . Chlorhexidine Gluconate Cloth  6 each Topical Daily  . dexamethasone (DECADRON) injection  10 mg Intravenous Daily  . diltiazem  120 mg Oral Daily  . feeding supplement (GLUCERNA SHAKE)  237 mL Oral BID BM  . flecainide  200 mg Oral BID  . fluticasone furoate-vilanterol  1 puff Inhalation Daily  . guaiFENesin  1,200 mg Oral BID  . hydrochlorothiazide  12.5 mg Oral Daily  . hydroxychloroquine  200 mg Oral Daily  . insulin aspart  0-20 Units Subcutaneous TID WC  . insulin aspart  4 Units Subcutaneous TID WC  . insulin glargine  25 Units Subcutaneous Daily  . ipratropium-albuterol  3 mL Nebulization TID  . levothyroxine  88 mcg Oral Q0600  . mouth rinse  15 mL  Mouth Rinse q12n4p   Continuous Infusions: PRN Meds:.dextrose, hydrALAZINE, temazepam Medications Prior to Admission:  Prior to Admission medications   Medication Sig Start Date End Date Taking? Authorizing Provider  albuterol (VENTOLIN HFA) 108 (90 Base) MCG/ACT inhaler Inhale 2 puffs into the lungs every 6 (six) hours as needed for wheezing or shortness of breath.   Yes [provider]  ALLOPURINOL PO Take 300 mg by mouth daily.   Yes [provider]  diltiazem (CARDIZEM) 120 MG tablet Take 120 mg by mouth daily.   Yes [provider]  flecainide (  TAMBOCOR) 100 MG tablet Take 200 mg by mouth 2 (two) times daily.   Yes [provider]  Fluticasone-Umeclidin-Vilant (TRELEGY ELLIPTA) 200-62.5-25 MCG/INH AEPB Inhale 1 puff into the lungs daily. 05/09/20  Yes Martyn Ehrich, NP  guaiFENesin (MUCINEX) 600 MG 12 hr tablet Take 1,200 mg by mouth 2 (two) times daily.   Yes [provider]  hydrochlorothiazide (MICROZIDE) 12.5 MG capsule Take 12.5 mg by mouth daily.   Yes [provider]  hydroxychloroquine (PLAQUENIL) 200 MG tablet Take 200 mg by mouth daily. 05/15/19  Yes [provider]  insulin aspart (NOVOLOG) 100 UNIT/ML injection Inject into the skin as directed. Via Pump /   Yes [provider]  levothyroxine (SYNTHROID) 88 MCG tablet Take 88 mcg by mouth daily before breakfast.   Yes [provider]  OXYGEN Place into the nose. 4 liters continuously   Yes [provider]  potassium chloride SA (K-DUR) 20 MEQ tablet Take 20 mEq by mouth daily.   Yes [provider]  Respiratory Therapy Supplies (FLUTTER) DEVI 1 Device by Does not apply route as needed. 08/09/19  Yes Tanda Rockers, MD  temazepam (RESTORIL) 30 MG capsule Take 30 mg by mouth at bedtime as needed for sleep.   Yes [provider]  torsemide (DEMADEX) 10 MG tablet Take 10 mg by mouth daily.   Yes [provider]   UNABLE TO FIND Med Name: BIPAP with 3lpm o2   Yes [provider]  doxycycline (VIBRA-TABS) 100 MG tablet Take 1 tablet (100 mg total) by mouth 2 (two) times daily. Patient not taking: Reported on 06/03/2020 04/16/20   Laurin Coder, MD   Allergies  Allergen Reactions  . Mefloquine Hcl Nausea And Vomiting    Altered mental status    Review of Systems Denies pain, denies dyspnea currently.   Physical Exam Patient has diminished breath sounds Patient has cervical adenopathy Patient is awake alert resting in bed Patient with some generalized weakness Patient on supplemental oxygen  Vital Signs: BP 124/66   Pulse 66   Temp (!) 97.5 F (36.4 C) (Axillary)   Resp 13   Ht 5\' 10"  (1.778 m)   Wt 66.2 kg   SpO2 95%   BMI 20.94 kg/m  Pain Scale: 0-10   Pain Score: 0-No pain   SpO2: SpO2: 95 % O2 Device:SpO2: 95 % O2 Flow Rate: .O2 Flow Rate (L/min): 6 L/min  IO: Intake/output summary:   Intake/Output Summary (Last 24 hours) at 06/05/2020 1401 Last data filed at 06/05/2020 1300 Gross per 24 hour  Intake 120 ml  Output 1200 ml  Net -1080 ml    LBM: Last BM Date: 06/03/20 Baseline Weight: Weight: 66.2 kg Most recent weight: Weight: 66.2 kg     Palliative Assessment/Data:   Palliative performance scale 40%  Time In: 1300 Time Out: 1400 Time Total: 60 Greater than 50%  of this time was spent counseling and coordinating care related to the above assessment and plan.  Signed by: Loistine Chance, MD   Please contact Palliative Medicine Team phone at [phone] for questions and concerns.  For individual provider: See Shea Evans

## 2020-06-06 ENCOUNTER — Inpatient Hospital Stay (HOSPITAL_COMMUNITY): Payer: Medicare Other

## 2020-06-06 DIAGNOSIS — C7931 Secondary malignant neoplasm of brain: Secondary | ICD-10-CM | POA: Diagnosis not present

## 2020-06-06 DIAGNOSIS — Z7189 Other specified counseling: Secondary | ICD-10-CM

## 2020-06-06 DIAGNOSIS — R531 Weakness: Secondary | ICD-10-CM

## 2020-06-06 DIAGNOSIS — J9611 Chronic respiratory failure with hypoxia: Secondary | ICD-10-CM | POA: Diagnosis not present

## 2020-06-06 DIAGNOSIS — Z515 Encounter for palliative care: Secondary | ICD-10-CM

## 2020-06-06 DIAGNOSIS — J9621 Acute and chronic respiratory failure with hypoxia: Secondary | ICD-10-CM | POA: Diagnosis not present

## 2020-06-06 DIAGNOSIS — C799 Secondary malignant neoplasm of unspecified site: Secondary | ICD-10-CM | POA: Diagnosis not present

## 2020-06-06 LAB — CBC WITH DIFFERENTIAL/PLATELET
Abs Immature Granulocytes: 0.06 10*3/uL (ref 0.00–0.07)
Basophils Absolute: 0 10*3/uL (ref 0.0–0.1)
Basophils Relative: 0 %
Eosinophils Absolute: 0 10*3/uL (ref 0.0–0.5)
Eosinophils Relative: 0 %
HCT: 44.9 % (ref 39.0–52.0)
Hemoglobin: 14.3 g/dL (ref 13.0–17.0)
Immature Granulocytes: 1 %
Lymphocytes Relative: 3 %
Lymphs Abs: 0.3 10*3/uL — ABNORMAL LOW (ref 0.7–4.0)
MCH: 31.1 pg (ref 26.0–34.0)
MCHC: 31.8 g/dL (ref 30.0–36.0)
MCV: 97.6 fL (ref 80.0–100.0)
Monocytes Absolute: 0.6 10*3/uL (ref 0.1–1.0)
Monocytes Relative: 5 %
Neutro Abs: 9.8 10*3/uL — ABNORMAL HIGH (ref 1.7–7.7)
Neutrophils Relative %: 91 %
Platelets: 265 10*3/uL (ref 150–400)
RBC: 4.6 MIL/uL (ref 4.22–5.81)
RDW: 16 % — ABNORMAL HIGH (ref 11.5–15.5)
WBC: 10.8 10*3/uL — ABNORMAL HIGH (ref 4.0–10.5)
nRBC: 0 % (ref 0.0–0.2)

## 2020-06-06 LAB — COMPREHENSIVE METABOLIC PANEL
ALT: 18 U/L (ref 0–44)
AST: 21 U/L (ref 15–41)
Albumin: 3.3 g/dL — ABNORMAL LOW (ref 3.5–5.0)
Alkaline Phosphatase: 113 U/L (ref 38–126)
Anion gap: 8 (ref 5–15)
BUN: 42 mg/dL — ABNORMAL HIGH (ref 8–23)
CO2: 37 mmol/L — ABNORMAL HIGH (ref 22–32)
Calcium: 9.2 mg/dL (ref 8.9–10.3)
Chloride: 94 mmol/L — ABNORMAL LOW (ref 98–111)
Creatinine, Ser: 0.97 mg/dL (ref 0.61–1.24)
GFR, Estimated: >60 mL/min (ref >60)
Glucose, Bld: 356 mg/dL — ABNORMAL HIGH (ref 70–99)
Potassium: 4.9 mmol/L (ref 3.5–5.1)
Sodium: 139 mmol/L (ref 135–145)
Total Bilirubin: 0.8 mg/dL (ref 0.3–1.2)
Total Protein: 5.9 g/dL — ABNORMAL LOW (ref 6.5–8.1)

## 2020-06-06 LAB — GLUCOSE, CAPILLARY
Glucose-Capillary: 319 mg/dL — ABNORMAL HIGH (ref 70–99)
Glucose-Capillary: 254 mg/dL — ABNORMAL HIGH (ref 70–99)
Glucose-Capillary: 138 mg/dL — ABNORMAL HIGH (ref 70–99)
Glucose-Capillary: 169 mg/dL — ABNORMAL HIGH (ref 70–99)

## 2020-06-06 LAB — BLOOD GAS, ARTERIAL
Acid-Base Excess: 10.2 mmol/L — ABNORMAL HIGH (ref 0.0–2.0)
Bicarbonate: 37.8 mmol/L — ABNORMAL HIGH (ref 20.0–28.0)
Drawn by: 25788
FIO2: 48
O2 Content: 7 L/min
O2 Saturation: 96.3 %
Patient temperature: 98.6
pCO2 arterial: 63.6 mmHg — ABNORMAL HIGH (ref 32.0–48.0)
pH, Arterial: 7.392 (ref 7.350–7.450)
pO2, Arterial: 91.7 mmHg (ref 83.0–108.0)

## 2020-06-06 LAB — MAGNESIUM: Magnesium: 2.1 mg/dL (ref 1.7–2.4)

## 2020-06-06 LAB — SURGICAL PATHOLOGY

## 2020-06-06 MED ORDER — BUDESONIDE 0.5 MG/2ML IN SUSP
0.5000 mg | Freq: Two times a day (BID) | RESPIRATORY_TRACT | Status: DC
  Administered 2020-06-06 – 2020-06-13 (×15): 0.5 mg via RESPIRATORY_TRACT
  Filled 2020-06-06 (×15): qty 2

## 2020-06-06 MED ORDER — SENNOSIDES-DOCUSATE SODIUM 8.6-50 MG PO TABS
1.0000 | ORAL_TABLET | Freq: Two times a day (BID) | ORAL | Status: DC
  Administered 2020-06-06 – 2020-06-12 (×12): 1 via ORAL
  Filled 2020-06-06 (×16): qty 1

## 2020-06-06 MED ORDER — METHYLPREDNISOLONE SODIUM SUCC 125 MG IJ SOLR
125.0000 mg | Freq: Three times a day (TID) | INTRAMUSCULAR | Status: DC
  Administered 2020-06-06 – 2020-06-08 (×6): 125 mg via INTRAVENOUS
  Filled 2020-06-06 (×4): qty 2

## 2020-06-06 MED ORDER — IPRATROPIUM-ALBUTEROL 0.5-2.5 (3) MG/3ML IN SOLN
3.0000 mL | Freq: Three times a day (TID) | RESPIRATORY_TRACT | Status: DC
  Administered 2020-06-06 – 2020-06-08 (×9): 3 mL via RESPIRATORY_TRACT
  Filled 2020-06-06 (×9): qty 3

## 2020-06-06 MED ORDER — PANTOPRAZOLE SODIUM 40 MG PO TBEC
40.0000 mg | DELAYED_RELEASE_TABLET | Freq: Every day | ORAL | Status: DC
  Administered 2020-06-06 – 2020-06-13 (×8): 40 mg via ORAL
  Filled 2020-06-06 (×8): qty 1

## 2020-06-06 MED ORDER — LORATADINE 10 MG PO TABS
10.0000 mg | ORAL_TABLET | Freq: Every day | ORAL | Status: DC
  Administered 2020-06-06 – 2020-06-13 (×7): 10 mg via ORAL
  Filled 2020-06-06 (×8): qty 1

## 2020-06-06 MED ORDER — POLYETHYLENE GLYCOL 3350 17 G PO PACK
17.0000 g | PACK | Freq: Two times a day (BID) | ORAL | Status: DC
  Administered 2020-06-06 – 2020-06-11 (×9): 17 g via ORAL
  Filled 2020-06-06 (×14): qty 1

## 2020-06-06 MED ORDER — IPRATROPIUM-ALBUTEROL 0.5-2.5 (3) MG/3ML IN SOLN: 3.0000 mL | Freq: Two times a day (BID) | RESPIRATORY_TRACT | Status: DC

## 2020-06-06 MED ORDER — LORAZEPAM 0.5 MG PO TABS
0.5000 mg | ORAL_TABLET | Freq: Once | ORAL | Status: AC
  Administered 2020-06-07: 0.5 mg via ORAL
  Filled 2020-06-06: qty 1

## 2020-06-06 NOTE — Progress Notes (Signed)
Daily Progress Note   Patient Name: Arthur [name]       Date: 06/06/2020 DOB: 31-Mar-1946  Age: 75 y.o. MRN#: [id number] Attending Physician: Eugenie Filler, MD Primary Care Physician: Bernerd Limbo, MD Admit Date: 06/02/2020  Reason for Consultation/Follow-up: Establishing goals of care  Subjective: Patient goes by "Arthur [name]".  He is awake alert resting in bed watching television.  He has mild dyspnea at times.  He states that he has had a busy day, he is thankful for input provided from medical oncology as well as radiation oncology perspective.  Remains admitted to hospital medicine service.  See below.   Length of Stay: 4  Current Medications: Scheduled Meds:  . (feeding supplement) PROSource Plus  30 mL Oral Daily  . allopurinol  300 mg Oral Daily  . budesonide (PULMICORT) nebulizer solution  0.5 mg Nebulization BID  . chlorhexidine  15 mL Mouth Rinse BID  . Chlorhexidine Gluconate Cloth  6 each Topical Daily  . diltiazem  120 mg Oral Daily  . feeding supplement (GLUCERNA SHAKE)  237 mL Oral BID BM  . flecainide  200 mg Oral BID  . guaiFENesin  1,200 mg Oral BID  . hydrochlorothiazide  12.5 mg Oral Daily  . hydroxychloroquine  200 mg Oral Daily  . insulin aspart  0-20 Units Subcutaneous TID WC  . insulin aspart  6 Units Subcutaneous TID WC  . insulin glargine  30 Units Subcutaneous Daily  . ipratropium-albuterol  3 mL Nebulization TID  . levothyroxine  88 mcg Oral Q0600  . loratadine  10 mg Oral Daily  . mouth rinse  15 mL Mouth Rinse q12n4p  . methylPREDNISolone (SOLU-MEDROL) injection  125 mg Intravenous Q8H  . pantoprazole  40 mg Oral Daily  . polyethylene glycol  17 g Oral BID  . senna-docusate  1 tablet Oral BID    Continuous Infusions:   PRN Meds: dextrose,  hydrALAZINE, temazepam  Physical Exam         Patient appears fatigued and in mild respiratory distress Diminished breath sounds S1-S2 Abdomen is not distended Awake alert oriented no focal deficits Does not get dyspneic when speaking for several minutes at a time No edema   Vital Signs: BP 124/66   Pulse 70   Temp 98 F (36.7 C) (Oral)   Resp 16  Ht 5\' 10"  (1.778 m)   Wt 72.2 kg   SpO2 95%   BMI 22.84 kg/m  SpO2: SpO2: 95 % O2 Device: O2 Device: Nasal Cannula ([place]) O2 Flow Rate: O2 Flow Rate (L/min): 7 L/min  Intake/output summary:   Intake/Output Summary (Last 24 hours) at 06/06/2020 1536 Last data filed at 06/06/2020 1534 Gross per 24 hour  Intake --  Output 1100 ml  Net -1100 ml   LBM: Last BM Date: 06/03/20 Baseline Weight: Weight: 66.2 kg Most recent weight: Weight: 72.2 kg       Palliative Assessment/Data:      Patient Active Problem List   Diagnosis Date Noted  . Acute on chronic respiratory failure with hypoxia ([place]) 06/05/2020  . Metastatic malignant neoplasm ([place])   . Malnutrition of moderate degree 06/04/2020  . Hyperglycemia 06/03/2020  . Hyperkalemia 06/03/2020  . PAF (paroxysmal atrial fibrillation) ([place]) 06/03/2020  . HTN (hypertension) 06/03/2020  . Hypothyroidism 06/03/2020  . Protein calorie malnutrition ([place]) 06/03/2020  . Brain metastases ([place]) 06/02/2020  . Hemoptysis 05/07/2020  . OSA treated with BiPAP 05/07/2020  . Chronic respiratory failure with hypoxia ([place]) 11/15/2018  . COPD GOLD IV  11/14/2018    Palliative Care Assessment & Plan   Patient Profile:    Assessment: 75 year old gentleman recently diagnosed with malignant melanoma presented with right upper lobe lung mass, bulky hilar and mediastinal lymphadenopathy also found to have metastatic disease to the brain bone and left adrenal gland. Medical oncology and radiation oncology are following.  Decisions pertaining to initiation of immunotherapy as well as  palliative radiotherapy with SRS to brain lesions is being considered.   Palliative medicine consultation for ongoing goals of care discussions and for additional support.   Recommendations/Plan: Family meeting with patient: Brief life review performed.  Patient is actually an ICU RN, has worked in a [employer] in [place], [place].  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  Patient states that he has traveled extensively as well.  In particular, he recalls his travels to [place] as well as [place] as being the most memorable.  He does not have a spouse he does not have children.  He relies on his sister for medical decision making, he also has several other siblings.  He states that he does not want to be a burden on others.  Patient is well aware of the current nature of his condition as well as proposed treatments.  He states he will discuss further with his sister regarding whether or not he would like to pursue immunotherapy as well as SRS radiation. Offered active listening and supportive care.  Discussed with the patient about how palliative services can continue to follow and to be of assistance going forward.   Code Status:    Code Status Orders  (From admission, onward)         Start     Ordered   06/03/20 0030  Do not attempt resuscitation (DNR)  Continuous       Question Answer Comment  In the event of cardiac or respiratory ARREST Do not call a "code blue"   In the event of cardiac or respiratory ARREST Do not perform Intubation, CPR, defibrillation or ACLS   In the event of cardiac or respiratory ARREST Use medication by any route, position, wound care, and other measures to relive pain and suffering. May use oxygen, suction and manual treatment of airway obstruction  as needed for comfort.      06/03/20 0029        Code Status History    Date Active Date Inactive Code Status Order ID  Comments User Context   06/02/2020 2331 06/03/2020 0029 Full Code [id number]  Orene Desanctis, DO ED   Advance Care Planning Activity      Prognosis:  Unable to determine  Discharge Planning: To Be Determined  Care plan was discussed with  Patient.   Thank you for allowing the Palliative Medicine Team to assist in the care of this patient.   Time In: 1500 Time Out: 1535 Total Time 35 Prolonged Time Billed  no       Greater than 50%  of this time was spent counseling and coordinating care related to the above assessment and plan.  Loistine Chance, MD  Please contact Palliative Medicine Team phone at [phone] for questions and concerns.

## 2020-06-06 NOTE — Progress Notes (Signed)
I spoke with the patient and his sister this afternoon by telephone, we reviewed the findings and work-up to date including his diagnosis of metastatic melanoma rather than a lung cancer.  The patient is contemplating all of his options but has been offered immunotherapy and while this does cross the blood-brain barrier his bulky disease in the brain and the need for steroids to manage his neurologic symptoms, Dr. Julien Nordmann favors proceeding with stereotactic radiosurgery now so that he can taper steroids in order to consider immunotherapy as an outpatient.  We discussed the delivery and logistics of stereotactic radiosurgery as well as the need for a 3T MRI scan which can be coordinated at [facility], he is claustrophobic so I have written for Ativan 0.5 mg to be administered 30 minutes p.o. prior to his MRI scan.  We discussed the planning process involved with CT simulation, mask making, and subsequent treatment.  I will reach out to our brain oncology navigator who can help with the coordination of this.  The patient and his sister are in agreement that they would like to move forward at least with getting this process started while they are still considering other options and wanting to maintain quality of life as the utmost concern in the midst of treatment.  We reviewed the risks, benefits, short and long-term effects of radiotherapy, and Dr. Lisbeth Renshaw has recommended fractionated treatment to the larger lesions in single fraction to the others.  The patient is also aware that his 3T MRI scan results can influence whether he is still a candidate for SRS.  We will discuss his case Monday morning and brain and spine oncology conference.  He is aware that he will likely be discharging prior to the treatment being administered.    Carola Rhine, PAC

## 2020-06-06 NOTE — Progress Notes (Addendum)
PROGRESS NOTE    Arthur [name]  WIO:[id number] DOB: 04-18-1946 DOA: 06/02/2020 PCP: Bernerd Limbo, MD    Chief Complaint  Patient presents with  . 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 Ander Slade pulmonology Positive Cologuard-refusing work-up and 02/2019 Found to have right upper lobe lung mass after office visit January-not a great candidate for bronchoscopy given severe emphysema risk of pneumothorax etc. PET scan showed metastatic disease to left adrenal and bone in addition to nodules Presented to emergency room 2/13 secondary to fall causing insulin pump to fall out and sugars were in the 500 range Also stated weakness, inability to stand, being down on the ground CT head =1 cm solid mass inferior frontal lobe on the right side with edema and necrotic masses in the right hemisphere with surrounding edema with 2 mm right to left shift Oncology consulted patient to start Decadron Neurosurgeon Dr. Glenford Peers consulted did not advise any further imaging Hyperglycemia aggressively treated with IV insulin initially and transitioned Going for lymph node biopsies will need skilled placement   Assessment & Plan:   Principal Problem:   Brain metastases ([place]) Active Problems:   Acute on chronic respiratory failure with hypoxia ([place])   Chronic respiratory failure with hypoxia ([facility])   Hyperglycemia   Hyperkalemia   PAF (paroxysmal atrial fibrillation) ([facility])   HTN (hypertension)   Hypothyroidism   Protein calorie malnutrition ([place])   Malnutrition of moderate degree   Metastatic malignant neoplasm ([place])  1 right upper lobe lung mass with mediastinal and hilar adenopathy with probable adrenal mets, bone mets, brain metastases Patient presented with ataxia with difficulty  standing.  Work-up done including head CT and MRI brain concerning for metastatic disease.  Concern for metastatic lung cancer.  Patient noted to be evaluated in the outpatient setting by pulmonary medicine and PET scan was performed May 21, 2020 with findings compatible with primary bronchogenic carcinoma with findings concerning for adrenal mets, bone mets.  Patient started on Decadron.  Patient seen in consultation by radiation oncology as well as medical oncology who recommended CT-guided biopsy of one of his lymph nodes per IR which was done on 06/04/2020 with results pending.  Radiation oncology/medical oncology following and management per oncology team.  Palliative care consulted and also following.   2.  Acute on chronic respiratory failure secondary to severe COPD on 3 L nasal cannula at baseline in the setting of metastatic probable lung cancer Patient currently on 9 L nasal cannula with sats around 96%.  Worsening respiratory status likely secondary to metastatic lung cancer.  Biopsies pending.  Patient with some complaints of worsening shortness of breath from his baseline.  Patient visibly short of breath, some use of accessory muscles of respiration.  Repeat chest x-ray.  Check ABG.  DC IV Decadron and placed on Solu-Medrol 125 mg IV every 8 hours.  Continue Mucinex.  Discontinue Breo Ellipta and put on Pulmicort nebs, continue scheduled duo nebs, place on BiPAP this morning.  Supportive care.  Follow.    3.  Paroxysmal atrial fibrillation Continue Cardizem and Tambocor for rate control.  Patient noted not on anticoagulation.  Follow.  4.  Type 2 diabetes mellitus on insulin pump at home Patient noted to have pump not functioning well.  On presentation patient noted to be  hypoglycemic requiring insulin drip and have subsequently been transitioned to subcutaneous Lantus, sliding scale insulin.  Patient with elevated CBGs likely elevated in part by steroids.  CBG at 319 this morning.  Lantus  increased to 30 units daily.  Continue NovoLog 6 units 3 times daily meal coverage.  Sliding scale insulin.  Diabetes coordinator following.  5.  Hypothyroidism Synthroid.    6.  Probable colon cancer Patient noted with concerns for possible colon cancer.  Patient will be a poor candidate for further work-up at this time.  Outpatient follow-up.  7.  Prior history of malignant melanoma Per oncology.  8.  Hypertension Continue HCTZ, Cardizem.  Hydralazine as needed.  9.  Constipation MiraLAX twice daily.  Senokot-S twice daily.    DVT prophylaxis: SCDs Code Status: DNR Family Communication: Updated patient and sister at bedside. Disposition:   Status is: Inpatient    Dispo: The patient is from: Home              Anticipated d/c is to: Home with home health versus SNF              Anticipated d/c date is: To be determined.              Patient currently undergoing work-up for metastatic lung disease, on 9 L nasal cannula.  Not stable for discharge   Difficult to place patient undetermined.       Consultants:   Palliative care: Dr. Rowe Pavy 06/05/2020  Interventional radiology: Dr. Annamaria Boots 06/04/2020  Radiation oncology: Worthy Flank, PA 06/03/2020  Medical oncology: Dr. Lorna Few 06/03/2020  Procedures:  CT Head 06/02/2020  Chest x-ray 06/02/2020  MRI brain 06/04/2020  Ultrasound-guided core biopsy of right cervical adenopathy per IR, Dr. Annamaria Boots 06/04/2020  Antimicrobials:   None   Subjective: Patient laying in bed.  Visibly short of breath.  Complaining of worsening shortness of breath.  No chest pain.  No abdominal pain.  Did not use BiPAP overnight.  Nasal cannula with sats of 96%.  Objective: Vitals:   06/06/20 0600 06/06/20 0700 06/06/20 0800 06/06/20 0928  BP: (!) 155/78 (!) 160/75 (!) 165/80 (!) 158/74  Pulse: 70 74 82   Resp: 11 15 20    Temp:   97.8 F (36.6 C)   TempSrc:   Oral   SpO2: 100% 96% 96%   Weight:      Height:         Intake/Output Summary (Last 24 hours) at 06/06/2020 1000 Last data filed at 06/06/2020 0800 Gross per 24 hour  Intake --  Output 1125 ml  Net -1125 ml   Filed Weights   06/02/20 2146  Weight: 66.2 kg    Examination:  General exam: NAD Respiratory system: Decreased breath sounds in the bases.  Tight.  Poor air movement.  Some use of accessory muscles of respiration.  Minimal to mild expiratory wheezing.  Cardiovascular system: RRR no murmurs rubs or gallops.  No JVD.  No lower extremity edema.   Gastrointestinal system: Abdomen is soft, nontender, nondistended, positive bowel sounds.  No rebound.  No guarding.   Central nervous system: Alert and oriented x 3. No focal neurological deficits. Extremities: Symmetric 5 x 5 power. Skin: No rashes, lesions or ulcers Psychiatry: Judgement and insight appear normal. Mood & affect appropriate.     Data Reviewed: I have personally reviewed following labs and imaging studies  CBC: Recent Labs  Lab 06/02/20 2224 06/02/20 2233 06/03/20 6283 06/04/20 0237 06/05/20 0250 06/06/20 0245  WBC 12.8*  --  12.2* 11.6* 14.0* 10.8*  NEUTROABS 10.2*  --   --  10.5* 13.0* 9.8*  HGB 14.0 16.0  16.0 12.6* 13.8 14.9 14.3  HCT 42.8 47.0  47.0 37.9* 42.4 45.9 44.9  MCV 95.7  --  96.7 95.7 96.0 97.6  PLT 281  --  261 265 314 562    Basic Metabolic Panel: Recent Labs  Lab 06/03/20 0307 06/04/20 0237 06/04/20 1217 06/05/20 0250 06/06/20 0245  NA 141 140 139 144 139  K 3.8 4.8 4.7 4.4 4.9  CL 102 98 97* 99 94*  CO2 31 33* 32 33* 37*  GLUCOSE 211* 435* 482* 229* 356*  BUN 30* 30* 33* 39* 42*  CREATININE 0.68 0.72 0.83 0.85 0.97  CALCIUM 8.7* 9.0 9.2 9.4 9.2    GFR: Estimated Creatinine Clearance: 64.5 mL/min (by C-G formula based on SCr of 0.97 mg/dL).  Liver Function Tests: Recent Labs  Lab 06/02/20 2310 06/04/20 0237 06/05/20 0250 06/06/20 0245  AST 24 21 21 21   ALT 15 15 17 18   ALKPHOS 117 103 108 113  BILITOT 1.2 0.8  0.8 0.8  PROT 6.5 6.0* 6.4* 5.9*  ALBUMIN 3.8 3.3* 3.6 3.3*    CBG: Recent Labs  Lab 06/05/20 0759 06/05/20 1216 06/05/20 1645 06/05/20 2113 06/06/20 0755  GLUCAP 306* 253* 368* 211* 319*     Recent Results (from the past 240 hour(s))  Resp Panel by RT-PCR (Flu A&B, Covid) Nasopharyngeal Swab     Status: None   Collection Time: 06/02/20 11:22 PM   Specimen: Nasopharyngeal Swab; Nasopharyngeal(NP) swabs in vial transport medium  Result Value Ref Range Status   SARS Coronavirus 2 by RT PCR NEGATIVE NEGATIVE Final    Comment: (NOTE) SARS-CoV-2 target nucleic acids are NOT DETECTED.  The SARS-CoV-2 RNA is generally detectable in upper respiratory specimens during the acute phase of infection. The lowest concentration of SARS-CoV-2 viral copies this assay can detect is 138 copies/mL. A negative result does not preclude SARS-Cov-2 infection and should not be used as the sole basis for treatment or other patient management decisions. A negative result may occur with  improper specimen collection/handling, submission of specimen other than nasopharyngeal swab, presence of viral mutation(s) within the areas targeted by this assay, and inadequate number of viral copies(<138 copies/mL). A negative result must be combined with clinical observations, patient history, and epidemiological information. The expected result is Negative.  Fact Sheet for Patients:  EntrepreneurPulse.com.au  Fact Sheet for Healthcare Providers:  IncredibleEmployment.be  This test is no t yet approved or cleared by the [place] FDA and  has been authorized for detection and/or diagnosis of SARS-CoV-2 by FDA under an Emergency Use Authorization (EUA). This EUA will remain  in effect (meaning this test can be used) for the duration of the COVID-19 declaration under Section 564(b)(1) of the Act, 21 U.S.C.section 360bbb-3(b)(1), unless the authorization is terminated   or revoked sooner.       Influenza A by PCR NEGATIVE NEGATIVE Final   Influenza B by PCR NEGATIVE NEGATIVE Final    Comment: (NOTE) The Xpert Xpress SARS-CoV-2/FLU/RSV plus assay is intended as an aid in the diagnosis of influenza from Nasopharyngeal swab specimens and should not be used as a sole basis for treatment. Nasal washings and aspirates are unacceptable for Xpert Xpress SARS-CoV-2/FLU/RSV testing.  Fact Sheet for Patients: EntrepreneurPulse.com.au  Fact Sheet for Healthcare Providers: IncredibleEmployment.be  This test is not yet approved or cleared by the [place] FDA  and has been authorized for detection and/or diagnosis of SARS-CoV-2 by FDA under an Emergency Use Authorization (EUA). This EUA will remain in effect (meaning this test can be used) for the duration of the COVID-19 declaration under Section 564(b)(1) of the Act, 21 U.S.C. section 360bbb-3(b)(1), unless the authorization is terminated or revoked.  Performed at [facility], [place] [address] [place], [place] [address]   MRSA PCR Screening     Status: None   Collection Time: 06/03/20  8:41 AM   Specimen: Nasal Mucosa; Nasopharyngeal  Result Value Ref Range Status   MRSA by PCR NEGATIVE NEGATIVE Final    Comment:        The GeneXpert MRSA Assay (FDA approved for NASAL specimens only), is one component of a comprehensive MRSA colonization surveillance program. It is not intended to diagnose MRSA infection nor to guide or monitor treatment for MRSA infections. Performed at [facility], [place] [address] [place], [place] [address]          Radiology Studies: MR BRAIN W WO CONTRAST  Result Date: 06/04/2020 CLINICAL DATA:  Metastatic lung cancer staging. EXAM: MRI HEAD WITHOUT AND WITH CONTRAST TECHNIQUE: Multiplanar, multiecho pulse sequences of the brain and surrounding structures were obtained without and with  intravenous contrast. CONTRAST:  69mL GADAVIST GADOBUTROL 1 MMOL/ML IV SOLN COMPARISON:  Head CT from 2 days ago FINDINGS: Brain: Progressively enhancing brain masses consistent with metastatic disease: 1. 9 mm in the right frontal lobe on 16:29 2. 3.4 cm in the higher right frontal lobe on 16:35 3. 9 mm in the parasagittal left frontal lobe on 16:41 4. 9 mm along the right frontal parietal junction measured on 16:42 5. 2.7 cm in the parasagittal right frontal parietal cortex measured on 16:40. The enhancing area was measured on axial slices. Equivocal subcentimeter nodule in the upper vermis seen on sagittal postcontrast imaging but not clearly visualized on axial slices and at a level not covered on coronal postcontrast imaging. There is a small nodular area along the posterior left temporal lobe on axial slices, 01:02, not seen on the other sequences. The metastases show variable amounts of blood products. Moderate vasogenic edema in the right cerebral hemisphere adjacent to the largest metastases. Vascular: Normal flow voids and vascular enhancements. Skull and upper cervical spine: No focal marrow lesion. Sinuses/Orbits: Negative for mass. Mucosal thickening in the left maxillary sinus. IMPRESSION: At least 5 brain metastases with the largest lesion showing blood products and moderate vasogenic edema. Potential for 2 additional subcentimeter metastases, certainty limited by relatively hypoenhancing characteristics and patient motion. Electronically Signed   By: Monte Fantasia M.D.   On: 06/04/2020 11:13   [place] CORE BIOPSY (LYMPH NODES)  Result Date: 06/04/2020 INDICATION: Suspect metastatic lung cancer, right cervical adenopathy EXAM: ULTRASOUND GUIDED CORE BIOPSY OF RIGHT CERVICAL ADENOPATHY MEDICATIONS: 1% LIDOCAINE LOCAL ANESTHESIA/SEDATION: Versed 1.0mg  IV; Fentanyl 53mcg IV; Moderate Sedation Time:  10 MINUTES The patient was continuously monitored during the procedure by the interventional radiology  nurse under my direct supervision. FLUOROSCOPY TIME:  Fluoroscopy Time: None. COMPLICATIONS: None immediate. PROCEDURE: The procedure, risks, benefits, and alternatives were explained to the patient. Questions regarding the procedure were encouraged and answered. The patient understands and consents to the procedure. Previous imaging reviewed. Preliminary ultrasound performed. Abnormal bulky right cervical adenopathy localized and marked. Under sterile conditions and local anesthesia, 18 gauge core biopsy advanced under ultrasound into the lymph nodes. 4 18 gauge core biopsies obtained. These were intact and non  fragmented. Samples placed in saline. Needle removed. Postprocedure imaging demonstrates no hemorrhage or hematoma. Patient tolerated biopsy well. FINDINGS: Imaging confirms needle placed in the right cervical adenopathy for core biopsy. IMPRESSION: Successful ultrasound right cervical adenopathy 18 gauge core biopsies Electronically Signed   By: Jerilynn Mages.  Shick M.D.   On: 06/04/2020 16:53        Scheduled Meds: . (feeding supplement) PROSource Plus  30 mL Oral Daily  . allopurinol  300 mg Oral Daily  . chlorhexidine  15 mL Mouth Rinse BID  . Chlorhexidine Gluconate Cloth  6 each Topical Daily  . dexamethasone (DECADRON) injection  10 mg Intravenous Daily  . diltiazem  120 mg Oral Daily  . feeding supplement (GLUCERNA SHAKE)  237 mL Oral BID BM  . flecainide  200 mg Oral BID  . fluticasone furoate-vilanterol  1 puff Inhalation Daily  . guaiFENesin  1,200 mg Oral BID  . hydrochlorothiazide  12.5 mg Oral Daily  . hydroxychloroquine  200 mg Oral Daily  . insulin aspart  0-20 Units Subcutaneous TID WC  . insulin aspart  6 Units Subcutaneous TID WC  . insulin glargine  30 Units Subcutaneous Daily  . ipratropium-albuterol  3 mL Nebulization BID  . levothyroxine  88 mcg Oral Q0600  . mouth rinse  15 mL Mouth Rinse q12n4p   Continuous Infusions:   LOS: 4 days    Time spent: 40  minutes    Irine Seal, MD [facility]   To contact the attending provider between 7A-7P or the covering provider during after hours 7P-7A, please log into the web site [email or web address] and access using universal [place] password for that web site. If you do not have the password, please call the hospital operator.  06/06/2020, 10:00 AM

## 2020-06-06 NOTE — Progress Notes (Signed)
Inpatient Diabetes Program Recommendations  AACE/ADA: New Consensus Statement on Inpatient Glycemic Control (2015)  Target Ranges:  Prepandial:   less than 140 mg/dL      Peak postprandial:   less than 180 mg/dL (1-2 hours)      Critically ill patients:  140 - 180 mg/dL   Lab Results  Component Value Date   GLUCAP 319 (H) 06/06/2020   HGBA1C 7.8 (H) 06/03/2020    Review of Glycemic Control Results for Arthur [name], Arthur [name] (MRN [id number]) as of 06/06/2020 09:30  Ref. Range 06/05/2020 07:59 06/05/2020 12:16 06/05/2020 16:45 06/05/2020 21:13 06/06/2020 07:55  Glucose-Capillary Latest Ref Range: 70 - 99 mg/dL 306 (H) 253 (H) 368 (H) 211 (H) 319 (H)   Diabetes history:  DM2 Current orders for Inpatient glycemic control:  Lantus 30 units daily Novolog 0-20 units TID Novolog 6 units TID Decadron 10 mg daily   Inpatient Diabetes Program Recommendations:     Noted insulins have been titrated.  Please also consider adding Novolog 0-5 units QHS  Will continue to follow while inpatient.  Thank you, Reche Dixon, RN, BSN Diabetes Coordinator Inpatient Diabetes Program [phone] (team pager from 8a-5p)

## 2020-06-06 NOTE — Progress Notes (Signed)
Occupational Therapy Treatment Patient Details Name: Arthur [name] MRN: [id number] DOB: 25-Jun-1945 Today's Date: 06/06/2020    History of present illness Arthur [name] is a 75 y.o. male with medical history significant for COPD with chronic hypoxemia on baseline 4 L, paroxysmal atrial fibrillation, hypertension, OSA, type 1 diabetes with retinopathy, post ablative hypothyroidism, and recent diagnosis of lung cancer with metastasis to bone, left adrenal and mediastinal lymphadenopathy who presents with fall, left leg weakness and  hyperglycemia.  CT-Two necrotic masses in the right hemisphere, located in the  right frontal lobe and right parietal vertex consistent with  metastases.   OT comments  Patient found on 4 L [place] at 94%. Patient reports not wanting to get out of bed but agreeable to sit at edge of bed. Patient max assist to don socks - attempting R sock but left hand not functionally useful. Prior to attempting transfer worked on gross motor control of LUE and LLE. Patient placed in supine prior to working on LUE and sat dropped to 86%. Patient reporting fatigue after minimal activity and not wanting to continue therapy. Patient reports understanding of his diagnosis but also unsure of the choices he will make going forward. Will continue to follow patient as long as patient's GOC are inline with continued therapy.    Follow Up Recommendations  SNF    Equipment Recommendations  None recommended by OT    Recommendations for Other Services      Precautions / Restrictions Precautions Precautions: Fall Precaution Comments: Left LE > UE weakness,; Lt inattention, Ataxia Restrictions Weight Bearing Restrictions: No       Mobility Bed Mobility               General bed mobility comments: Refused mobility.  Transfers                      Balance                                           ADL either performed or assessed with clinical judgement    ADL Overall ADL's : Needs assistance/impaired                     Lower Body Dressing: Bed level;Maximal assistance Lower Body Dressing Details (indicate cue type and reason): Max assist to don right sock - unable to functional use left hand to put on sock.                     Vision Baseline Vision/History: Wears glasses Wears Glasses: At all times Patient Visual Report: No change from baseline     Perception     Praxis      Cognition Arousal/Alertness: Awake/alert Behavior During Therapy: WFL for tasks assessed/performed Overall Cognitive Status: Within Functional Limits for tasks assessed                                          Exercises Other Exercises Other Exercises: Knee flexion and Knee extension x 5 with active assist - patient's hip rolling outwards. Pillow squeezes between nees (Activating left hip adductor) to work on controlling LLE. Other Exercises: Left shoulder flexion x 3 to target in supine, elbow flexion/extension working on control.   Shoulder Instructions  General Comments      Pertinent Vitals/ Pain       Pain Assessment: No/denies pain  Home Living                                          Prior Functioning/Environment              Frequency  Min 2X/week        Progress Toward Goals  OT Goals(current goals can now be found in the care plan section)  Progress towards OT goals: OT to reassess next treatment  Acute Rehab OT Goals Patient Stated Goal: To be more functional OT Goal Formulation: With patient Time For Goal Achievement: 06/18/20 Potential to Achieve Goals: [place] Discharge plan remains appropriate    Co-evaluation                 AM-PAC OT "6 Clicks" Daily Activity     Outcome Measure   Help from another person eating meals?: A Little Help from another person taking care of personal grooming?: A Little Help from another person toileting, which  includes using toliet, bedpan, or urinal?: A Lot Help from another person bathing (including washing, rinsing, drying)?: A Lot Help from another person to put on and taking off regular upper body clothing?: A Lot Help from another person to put on and taking off regular lower body clothing?: A Lot 6 Click Score: 14    End of Session Equipment Utilized During Treatment: Oxygen  OT Visit Diagnosis: Unsteadiness on feet (R26.81);Cognitive communication deficit (R41.841)   Activity Tolerance Patient limited by fatigue   Patient Left in bed;with call bell/phone within reach;with nursing/sitter in room   Nurse Communication Mobility status;Need for lift equipment        Time: 3086-5784 OT Time Calculation (min): 18 min  Charges: OT General Charges $OT Visit: 1 Visit OT Treatments $Therapeutic Activity: 8-22 mins  Derl Barrow, OTR/L [place]  Office [phone] Pager: [place] 06/06/2020, 3:40 PM

## 2020-06-06 NOTE — Progress Notes (Signed)
PT Cancellation Note  Patient Details Name: Arthur [name] MRN: [id number] DOB: 25-Mar-1946   Cancelled Treatment:     unable to participate this morning due to medical then this afternoon OT attempted.  Pt has been evaluated with rec for SNF.  Will continue to follow during his acute stay.     [name] 06/06/2020, 4:44 PM

## 2020-06-06 NOTE — Progress Notes (Signed)
I tried calling the patient's sister several times today but her voicemail is full. I will try to coordinate with the nurse regarding their decision on proceeding with [facility] radiotherapy and the additional MRI planning needed to proceed.     Carola Rhine, PAC

## 2020-06-06 NOTE — Progress Notes (Signed)
Subjective: The patient is seen and examined today.  He is feeling fine except for the baseline shortness of breath and he is still on oxygen by nasal cannula.  He has cough with no hemoptysis.  He denied having any fever or chills.  He has no nausea, vomiting, diarrhea or constipation.  The final pathology ([id number]) from the core biopsy of the right cervical lymph node biopsy was consistent with malignant melanoma. Immunohistochemistry for S-100 (patchy) and Melan-A is positive. CD45,CD20, CK7, CK20, TTF-1, CDX-2, GATA-3, PAX 8, p40, CK5/6, PSA, Prostein and Mucicarmine are negative. The morphologic, including cytoplasmic pigment, and immunophenotypic characteristics are consistent with malignant melanoma.  I am seeing the patient today for evaluation and discussion of his treatment options based on the new results.  Objective: Vital signs in last 24 hours: Temp:  [97.7 F (36.5 C)-98 F (36.7 C)] 98 F (36.7 C) (02/17 1200) Pulse Rate:  [66-92] 83 (02/17 1100) Resp:  [10-25] 18 (02/17 1100) BP: (124-168)/(61-144) 139/77 (02/17 1100) SpO2:  [86 %-100 %] 98 % (02/17 1100)  Intake/Output from previous day: 02/16 0701 - 02/17 0700 In: -  Out: 1100 [Urine:1100] Intake/Output this shift: Total I/O In: -  Out: 350 [Urine:350]  General appearance: alert, cooperative, fatigued and mild distress Resp: diminished breath sounds RUL and dullness to percussion RUL Cardio: regular rate and rhythm, S1, S2 normal, no murmur, click, rub or gallop GI: soft, non-tender; bowel sounds normal; no masses,  no organomegaly Extremities: extremities normal, atraumatic, no cyanosis or edema  Lab Results:  Recent Labs    06/05/20 0250 06/06/20 0245  WBC 14.0* 10.8*  HGB 14.9 14.3  HCT 45.9 44.9  PLT 314 265   BMET Recent Labs    06/05/20 0250 06/06/20 0245  NA 144 139  K 4.4 4.9  CL 99 94*  CO2 33* 37*  GLUCOSE 229* 356*  BUN 39* 42*  CREATININE 0.85 0.97  CALCIUM 9.4 9.2     Studies/Results: DG CHEST PORT 1 VIEW  Result Date: 06/06/2020 CLINICAL DATA:  Shortness of breath. EXAM: PORTABLE CHEST 1 VIEW COMPARISON:  June 02, 2020. FINDINGS: Stable right hilar and paratracheal adenopathy is noted. Stable appearance of right upper lobe lung mass is noted. No pneumothorax or pleural effusion is noted. Stable right upper and lower lobe opacities are noted concerning for possible scarring or inflammation. The visualized skeletal structures are unremarkable. IMPRESSION: Stable right hilar and paratracheal adenopathy. Stable appearance of right upper lobe lung mass. Stable right upper and lower lobe opacities are noted concerning for possible scarring or inflammation. Electronically Signed   By: Marijo Conception M.D.   On: 06/06/2020 11:42   [place] CORE BIOPSY (LYMPH NODES)  Result Date: 06/04/2020 INDICATION: Suspect metastatic lung cancer, right cervical adenopathy EXAM: ULTRASOUND GUIDED CORE BIOPSY OF RIGHT CERVICAL ADENOPATHY MEDICATIONS: 1% LIDOCAINE LOCAL ANESTHESIA/SEDATION: Versed 1.45m IV; Fentanyl 553m IV; Moderate Sedation Time:  10 MINUTES The patient was continuously monitored during the procedure by the interventional radiology nurse under my direct supervision. FLUOROSCOPY TIME:  Fluoroscopy Time: None. COMPLICATIONS: None immediate. PROCEDURE: The procedure, risks, benefits, and alternatives were explained to the patient. Questions regarding the procedure were encouraged and answered. The patient understands and consents to the procedure. Previous imaging reviewed. Preliminary ultrasound performed. Abnormal bulky right cervical adenopathy localized and marked. Under sterile conditions and local anesthesia, 18 gauge core biopsy advanced under ultrasound into the lymph nodes. 4 18 gauge core biopsies obtained. These were intact and non fragmented. Samples placed in  saline. Needle removed. Postprocedure imaging demonstrates no hemorrhage or hematoma. Patient tolerated  biopsy well. FINDINGS: Imaging confirms needle placed in the right cervical adenopathy for core biopsy. IMPRESSION: Successful ultrasound right cervical adenopathy 18 gauge core biopsies Electronically Signed   By: Jerilynn Mages.  Shick M.D.   On: 06/04/2020 16:53    Medications: I have reviewed the patient's current medications.  Assessment/Plan: This is a very pleasant 75 years old white male recently diagnosed with stage IV (TX, N3, M1 C) malignant melanoma presented with large anterior right upper lobe lung mass in addition to numerous irregular solid bilateral pulmonary nodules and bulky hilar and mediastinal lymphadenopathy as well as neck lymphadenopathy and metastatic disease to the brain, bone and left adrenal gland diagnosed in February 2022. I had a lengthy discussion with the patient today about his current condition and treatment options. The patient was given the option of palliative care versus consideration of palliative treatment with immunotherapy with a combination of ipilimumab and nivolumab for 4 cycles followed by maintenance treatment with nivolumab.  This treatment with immunotherapy has a 50% survival benefit of around 6.5 years.  I understand the patient has other comorbidities and his survival could be shorter but treatment with immunotherapy is not as aggressive as treatment with chemotherapy and still have some concerning adverse effects including but not limited to immunotherapy mediated skin rash, diarrhea, inflammation of the lung, kidney, liver, thyroid or other endocrine dysfunction including type 1 diabetes mellitus. The immunotherapy also has the ability to work on the brain metastasis but with the large size lesion, he may benefit from palliative radiotherapy with SRS to these lesions so the Decadron can be tapered before starting immunotherapy. The patient was also seen by Dr. Mickeal Skinner from neuro oncology and Dr. Lisbeth Renshaw from radiation oncology for management of his brain  metastasis. The patient would like to take some time to think about his options. If he decides to proceed with the treatment, we will test his tissue block for BRAF mutation. Thank you so much for allowing me to participate in the care of Mr. Wandel.  I will continue to follow-up the patient with you and assist in his management and I will arrange for him a follow-up appointment with me at the cancer center if he decides to proceed with treatment.  Disclaimer: This note was dictated with voice recognition software. Similar sounding words can inadvertently be transcribed and may be missed upon review.    LOS: 4 days    Eilleen Kempf 06/06/2020

## 2020-06-07 ENCOUNTER — Other Ambulatory Visit (HOSPITAL_COMMUNITY): Payer: Medicare Other

## 2020-06-07 ENCOUNTER — Ambulatory Visit (HOSPITAL_COMMUNITY)
Admit: 2020-06-07 | Discharge: 2020-06-07 | Disposition: A | Discharge status: Home / Self Care | Payer: Medicare Other | Attending: Radiation Oncology | Admitting: Radiation Oncology

## 2020-06-07 ENCOUNTER — Encounter: Payer: Self-pay | Admitting: *Deleted

## 2020-06-07 DIAGNOSIS — C799 Secondary malignant neoplasm of unspecified site: Secondary | ICD-10-CM

## 2020-06-07 DIAGNOSIS — J9611 Chronic respiratory failure with hypoxia: Secondary | ICD-10-CM | POA: Diagnosis not present

## 2020-06-07 DIAGNOSIS — J9621 Acute and chronic respiratory failure with hypoxia: Secondary | ICD-10-CM | POA: Diagnosis not present

## 2020-06-07 DIAGNOSIS — C7931 Secondary malignant neoplasm of brain: Secondary | ICD-10-CM | POA: Diagnosis not present

## 2020-06-07 DIAGNOSIS — C439 Malignant melanoma of skin, unspecified: Secondary | ICD-10-CM | POA: Diagnosis present

## 2020-06-07 DIAGNOSIS — Z515 Encounter for palliative care: Secondary | ICD-10-CM | POA: Diagnosis not present

## 2020-06-07 DIAGNOSIS — R531 Weakness: Secondary | ICD-10-CM | POA: Diagnosis not present

## 2020-06-07 DIAGNOSIS — Z7189 Other specified counseling: Secondary | ICD-10-CM | POA: Diagnosis not present

## 2020-06-07 LAB — CBC WITH DIFFERENTIAL/PLATELET
Abs Immature Granulocytes: 0.06 10*3/uL (ref 0.00–0.07)
Basophils Absolute: 0 10*3/uL (ref 0.0–0.1)
Basophils Relative: 0 %
Eosinophils Absolute: 0 10*3/uL (ref 0.0–0.5)
Eosinophils Relative: 0 %
HCT: 43.8 % (ref 39.0–52.0)
Hemoglobin: 14.3 g/dL (ref 13.0–17.0)
Immature Granulocytes: 1 %
Lymphocytes Relative: 2 %
Lymphs Abs: 0.2 10*3/uL — ABNORMAL LOW (ref 0.7–4.0)
MCH: 30.9 pg (ref 26.0–34.0)
MCHC: 32.6 g/dL (ref 30.0–36.0)
MCV: 94.6 fL (ref 80.0–100.0)
Monocytes Absolute: 0.2 10*3/uL (ref 0.1–1.0)
Monocytes Relative: 2 %
Neutro Abs: 8.3 10*3/uL — ABNORMAL HIGH (ref 1.7–7.7)
Neutrophils Relative %: 95 %
Platelets: 236 10*3/uL (ref 150–400)
RBC: 4.63 MIL/uL (ref 4.22–5.81)
RDW: 16.1 % — ABNORMAL HIGH (ref 11.5–15.5)
WBC: 8.8 10*3/uL (ref 4.0–10.5)
nRBC: 0 % (ref 0.0–0.2)

## 2020-06-07 LAB — COMPREHENSIVE METABOLIC PANEL
ALT: 23 U/L (ref 0–44)
AST: 20 U/L (ref 15–41)
Albumin: 3.2 g/dL — ABNORMAL LOW (ref 3.5–5.0)
Alkaline Phosphatase: 106 U/L (ref 38–126)
Anion gap: 9 (ref 5–15)
BUN: 37 mg/dL — ABNORMAL HIGH (ref 8–23)
CO2: 36 mmol/L — ABNORMAL HIGH (ref 22–32)
Calcium: 9.1 mg/dL (ref 8.9–10.3)
Chloride: 96 mmol/L — ABNORMAL LOW (ref 98–111)
Creatinine, Ser: 0.68 mg/dL (ref 0.61–1.24)
GFR, Estimated: >60 mL/min (ref >60)
Glucose, Bld: 276 mg/dL — ABNORMAL HIGH (ref 70–99)
Potassium: 4.8 mmol/L (ref 3.5–5.1)
Sodium: 141 mmol/L (ref 135–145)
Total Bilirubin: 0.9 mg/dL (ref 0.3–1.2)
Total Protein: 5.5 g/dL — ABNORMAL LOW (ref 6.5–8.1)

## 2020-06-07 LAB — GLUCOSE, CAPILLARY
Glucose-Capillary: 301 mg/dL — ABNORMAL HIGH (ref 70–99)
Glucose-Capillary: 216 mg/dL — ABNORMAL HIGH (ref 70–99)
Glucose-Capillary: 180 mg/dL — ABNORMAL HIGH (ref 70–99)

## 2020-06-07 MED ORDER — TORSEMIDE 10 MG PO TABS
10.0000 mg | ORAL_TABLET | Freq: Every day | ORAL | Status: DC
  Administered 2020-06-07 – 2020-06-10 (×4): 10 mg via ORAL
  Filled 2020-06-07 (×5): qty 1

## 2020-06-07 MED ORDER — GADOBUTROL 1 MMOL/ML IV SOLN
6.5000 mL | Freq: Once | INTRAVENOUS | Status: AC | PRN
  Administered 2020-06-07: 6.5 mL via INTRAVENOUS

## 2020-06-07 MED ORDER — INSULIN GLARGINE 100 UNIT/ML ~~LOC~~ SOLN
5.0000 [IU] | Freq: Once | SUBCUTANEOUS | Status: AC
  Administered 2020-06-07: 5 [IU] via SUBCUTANEOUS
  Filled 2020-06-07: qty 0.05

## 2020-06-07 MED ORDER — INSULIN GLARGINE 100 UNIT/ML ~~LOC~~ SOLN
35.0000 [IU] | Freq: Every day | SUBCUTANEOUS | Status: DC
  Administered 2020-06-08 – 2020-06-10 (×3): 35 [IU] via SUBCUTANEOUS
  Filled 2020-06-07 (×3): qty 0.35

## 2020-06-07 NOTE — Progress Notes (Signed)
Daily Progress Note   Patient Name: Arthur [name]       Date: 06/07/2020 DOB: 02-18-1946  Age: 75 y.o. MRN#: [id number] Attending Physician: Eugenie Filler, MD Primary Care Physician: Bernerd Limbo, MD Admit Date: 06/02/2020  Reason for Consultation/Follow-up: Establishing goals of care  Subjective: Patient goes by "Arthur [name]".  He is awake alert resting in bed, sister [name] is at bedside. E discussed broad goals of care, scope of treatments that are being offered, differences between hospice and palliative care, see below.     Length of Stay: 5  Current Medications: Scheduled Meds:  . (feeding supplement) PROSource Plus  30 mL Oral Daily  . allopurinol  300 mg Oral Daily  . budesonide (PULMICORT) nebulizer solution  0.5 mg Nebulization BID  . chlorhexidine  15 mL Mouth Rinse BID  . Chlorhexidine Gluconate Cloth  6 each Topical Daily  . diltiazem  120 mg Oral Daily  . feeding supplement (GLUCERNA SHAKE)  237 mL Oral BID BM  . flecainide  200 mg Oral BID  . guaiFENesin  1,200 mg Oral BID  . hydrochlorothiazide  12.5 mg Oral Daily  . hydroxychloroquine  200 mg Oral Daily  . insulin aspart  0-20 Units Subcutaneous TID WC  . insulin aspart  6 Units Subcutaneous TID WC  . [START ON 06/08/2020] insulin glargine  35 Units Subcutaneous Daily  . ipratropium-albuterol  3 mL Nebulization TID  . levothyroxine  88 mcg Oral Q0600  . loratadine  10 mg Oral Daily  . mouth rinse  15 mL Mouth Rinse q12n4p  . methylPREDNISolone (SOLU-MEDROL) injection  125 mg Intravenous Q8H  . pantoprazole  40 mg Oral Daily  . polyethylene glycol  17 g Oral BID  . senna-docusate  1 tablet Oral BID  . torsemide  10 mg Oral Daily    Continuous Infusions:   PRN Meds: dextrose, hydrALAZINE,  temazepam  Physical Exam         Patient appears fatigued and in mild respiratory distress Diminished breath sounds S1-S2 Abdomen is not distended Awake alert oriented no focal deficits Does  get cough and appears more dyspneic when speaking for several minutes at a time No edema   Vital Signs: BP (!) 151/78   Pulse 79   Temp 97.9 F (36.6 C) (Oral)   Resp 20  Ht 5\' 10"  (1.778 m)   Wt 64.8 kg   SpO2 95%   BMI 20.50 kg/m  SpO2: SpO2: 95 % O2 Device: O2 Device: Nasal Cannula O2 Flow Rate: O2 Flow Rate (L/min): 4 L/min  Intake/output summary:   Intake/Output Summary (Last 24 hours) at 06/07/2020 1645 Last data filed at 06/07/2020 1545 Gross per 24 hour  Intake 240 ml  Output 1175 ml  Net -935 ml   LBM: Last BM Date: 06/03/20 Baseline Weight: Weight: 66.2 kg Most recent weight: Weight: 64.8 kg       Palliative Assessment/Data:      Patient Active Problem List   Diagnosis Date Noted  . Metastatic melanoma ([place]) 06/07/2020  . Palliative care by specialist   . Goals of care, counseling/discussion   . General weakness   . Acute on chronic respiratory failure with hypoxia ([place]) 06/05/2020  . Metastatic malignant neoplasm ([place])   . Malnutrition of moderate degree 06/04/2020  . Hyperglycemia 06/03/2020  . Hyperkalemia 06/03/2020  . PAF (paroxysmal atrial fibrillation) ([place]) 06/03/2020  . HTN (hypertension) 06/03/2020  . Hypothyroidism 06/03/2020  . Protein calorie malnutrition ([place]) 06/03/2020  . Brain metastases ([place]) 06/02/2020  . Hemoptysis 05/07/2020  . OSA treated with BiPAP 05/07/2020  . Chronic respiratory failure with hypoxia ([place]) 11/15/2018  . COPD GOLD IV  11/14/2018    Palliative Care Assessment & Plan   Patient Profile:    Assessment: 74 year old gentleman recently diagnosed with malignant melanoma presented with right upper lobe lung mass, bulky hilar and mediastinal lymphadenopathy also found to have metastatic disease to the brain bone and  left adrenal gland. Medical oncology and radiation oncology are following.  Decisions pertaining to initiation of immunotherapy as well as palliative radiotherapy with SRS to brain lesions is being considered.   Palliative medicine consultation for ongoing goals of care discussions and for additional support.   Recommendations/Plan: Goals of care and family meeting with patient and his sister [name]: Patient and sister are thankful for the information provided.  They are much appreciative of various specialists are being in and offering options.  The recall conversations with Dr. Earlie Server from medical oncology as well as Dr. Mickeal Skinner and Worthy Flank from radiation oncology. Offered active listening and supportive care.  Differences between hospice and palliative discussed.  Discussed with the patient about various disposition options such as skilled nursing facility with rehabilitation alongside palliative services versus home-based palliative care versus hospice support.  Patient lives off of [address] in [place], [place] as well as patient's sister has had some friends that have sought care at [facility].  They asked about the type of care that is provided inside a residential hospice.  All of their questions answered to the best of my ability.  Patient went for an MRI earlier today.  Patient states that he will continue to discuss with his sister with regards to next steps/decision making. PMT to follow.  Code Status:    Code Status Orders  (From admission, onward)         Start     Ordered   06/03/20 0030  Do not attempt resuscitation (DNR)  Continuous       Question Answer Comment  In the event of cardiac or respiratory ARREST Do not call a "code blue"   In the event of cardiac or respiratory ARREST Do not perform Intubation, CPR, defibrillation or ACLS   In the event of cardiac or respiratory ARREST Use medication by any route,  position, wound  care, and other measures to relive pain and suffering. May use oxygen, suction and manual treatment of airway obstruction as needed for comfort.      06/03/20 0029        Code Status History    Date Active Date Inactive Code Status Order ID Comments User Context   06/02/2020 2331 06/03/2020 0029 Full Code [id number]  Orene Desanctis, DO ED   Advance Care Planning Activity       Prognosis:   Unable to determine  Discharge Planning:  To Be Determined  Care plan was discussed with  Patient and sister.  Thank you for allowing the Palliative Medicine Team to assist in the care of this patient.   Time In: 1600 Time Out: 1635 Total Time 35 Prolonged Time Billed  no       Greater than 50%  of this time was spent counseling and coordinating care related to the above assessment and plan.  Loistine Chance, MD  Please contact Palliative Medicine Team phone at [phone] for questions and concerns.

## 2020-06-07 NOTE — TOC Progression Note (Signed)
Transition of Care [facility]) - Progression Note    Patient Details  Name: Jorje Vanatta MRN: [id number] Date of Birth: [birth year]  Transition [facility]) CM/SW Contact  Leeroy Cha, RN Phone Number: 06/07/2020, 7:58 AM  Clinical Narrative:    fl2 note sent out to area snf for review.   Expected Discharge Plan: [place] Barriers to Discharge: Continued Medical Work up  Expected Discharge Plan and Services Expected Discharge Plan: [place]   Discharge Planning Services: CM Consult Post Acute Care Choice: [place] Living arrangements for the past 2 months: Single Family Home                                       Social Determinants of Health (SDOH) Interventions    Readmission Risk Interventions No flowsheet data found.

## 2020-06-07 NOTE — Progress Notes (Signed)
Inpatient Diabetes Program Recommendations  AACE/ADA: New Consensus Statement on Inpatient Glycemic Control (2015)  Target Ranges:  Prepandial:   less than 140 mg/dL      Peak postprandial:   less than 180 mg/dL (1-2 hours)      Critically ill patients:  140 - 180 mg/dL   Lab Results  Component Value Date   GLUCAP 301 (H) 06/07/2020   HGBA1C 7.8 (H) 06/03/2020    Review of Glycemic Control Results for Arthur [name], Arthur [name] (MRN [id number]) as of 06/07/2020 08:59  Ref. Range 06/06/2020 07:55 06/06/2020 12:04 06/06/2020 17:07 06/06/2020 21:04 06/07/2020 07:40  Glucose-Capillary Latest Ref Range: 70 - 99 mg/dL 319 (H) 254 (H) 138 (H) 169 (H) 301 (H)    Inpatient Diabetes Program Recommendations:    Lantus 35 units daily Novolog 0-5 units QHS  Will continue to follow while inpatient.  Thank you, Reche Dixon, RN, BSN Diabetes Coordinator Inpatient Diabetes Program [phone] (team pager from 8a-5p)

## 2020-06-07 NOTE — Progress Notes (Signed)
PROGRESS NOTE    Arthur [name]  GGE:[id number] DOB: [birth year] DOA: 06/02/2020 PCP: Bernerd Limbo, MD    Chief Complaint  Patient presents with  . 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 Ander Slade pulmonology Positive Cologuard-refusing work-up and 02/2019 Found to have right upper lobe lung mass after office visit January-not a great candidate for bronchoscopy given severe emphysema risk of pneumothorax etc. PET scan showed metastatic disease to left adrenal and bone in addition to nodules Presented to emergency room 2/13 secondary to fall causing insulin pump to fall out and sugars were in the 500 range Also stated weakness, inability to stand, being down on the ground CT head =1 cm solid mass inferior frontal lobe on the right side with edema and necrotic masses in the right hemisphere with surrounding edema with 2 mm right to left shift Oncology consulted patient to start Decadron Neurosurgeon Dr. Glenford Peers consulted did not advise any further imaging Hyperglycemia aggressively treated with IV insulin initially and transitioned Going for lymph node biopsies will need skilled placement   Assessment & Plan:   Principal Problem:   Metastatic melanoma ([place]) Active Problems:   Acute on chronic respiratory failure with hypoxia ([place])   Chronic respiratory failure with hypoxia ([facility])   Brain metastases ([facility])   Hyperglycemia   Hyperkalemia   PAF (paroxysmal atrial fibrillation) ([facility])   HTN (hypertension)   Hypothyroidism   Protein calorie malnutrition ([place])   Malnutrition of moderate degree   Metastatic malignant neoplasm ([place])   Palliative care by specialist   Goals of care, counseling/discussion   General weakness  1 metastatic malignant melanoma /right upper lobe  lung mass with mediastinal and hilar adenopathy with probable adrenal mets, bone mets, brain metastases Patient presented with ataxia with difficulty standing.  Work-up done including head CT and MRI brain concerning for metastatic disease.  Concern for metastatic lung cancer.  Patient noted to be evaluated in the outpatient setting by pulmonary medicine and PET scan was performed May 21, 2020 with findings compatible with primary bronchogenic carcinoma with findings concerning for adrenal mets, bone mets.  Patient started on Decadron.  Patient seen in consultation by radiation oncology as well as medical oncology who recommended CT-guided biopsy of one of his lymph nodes per IR which was done on 06/04/2020 with pathology consistent with malignant melanoma.  Medical oncology, radiation oncology, neuro-oncology following and have discussed with patient and family on prognosis and palliative treatment with immunotherapy.  Patient thinking about recommendations to decide whether to proceed with treatment at this time.  Patient for 3T MRI scan to be done today per radiation oncology and to evaluate whether patient is a candidate for SRS.  Per medical oncology/neuro-oncology/radiation oncology.  Palliative care also following and recommending palliative care to follow at SNF on discharge.   2.  Acute on chronic respiratory failure secondary to severe COPD on 4 L nasal cannula at baseline in the setting of metastatic probable lung cancer Patient currently on 6 L nasal cannula with sats around 96-100%.  Worsening respiratory status noted on 06/06/2020, likely secondary to metastatic lung cancer/metastatic melanoma and possible COPD exacerbation.  Cervical biopsy consistent with malignant melanoma..  Patient with some complaints of worsening shortness of breath from his baseline.  Patient less visibly short of breath today with  no use of accessory muscles of respiration.  ABG obtained with a pH of 7.39, PCO2 of 64,  PO2 of 92, acid base of 10.2, bicarb of 38.  Continue IV Solu-Medrol 125 mg every 8 hours and taper tomorrow.  Continue Mucinex, Pulmicort nebs, scheduled duo nebs.  BiPAP nightly.  Supportive care.  Follow.    3.  Paroxysmal atrial fibrillation Continue Cardizem and Tambocor for rate control.  Patient noted not on anticoagulation.  Follow.  4.  Type 2 diabetes mellitus on insulin pump at home Patient noted to have pump not functioning well.  On presentation patient noted to be hypoglycemic requiring insulin drip and have subsequently been transitioned to subcutaneous Lantus, sliding scale insulin.  Patient with elevated CBGs of 301 this morning.  Elevated CBGs likely in part due to IV steroids.  Increase Lantus to 35 units daily.  Patient already received 30 units of Lantus today we will give a one-time dose of Lantus 5 units x 1.  Continue meal coverage NovoLog 6 units 3 times daily with meals.  Sliding scale insulin.  Diabetic coordinator following.   5.  Hypothyroidism Continue Synthroid.    6.  Probable colon cancer Patient noted with concerns for possible colon cancer.  Patient will be a poor candidate for further work-up at this time.  Outpatient follow-up.  7.  Prior history of malignant melanoma Per oncology.  8.  Hypertension Continue HCTZ, Cardizem.  Resume home regimen torsemide.  Hydralazine as needed.  9.  Constipation Continue bowel regimen of MiraLAX twice daily, Senokot-S twice daily.     DVT prophylaxis: SCDs Code Status: DNR Family Communication: Updated patient and sister at bedside. Disposition:   Status is: Inpatient    Dispo: The patient is from: Home              Anticipated d/c is to: SNF              Anticipated d/c date is: To be determined.              Patient currently undergoing work-up for metastatic disease, on 6 L nasal cannula.  Not stable for discharge   Difficult to place patient undetermined.       Consultants:   Palliative care: Dr.  Rowe Pavy 06/05/2020  Interventional radiology: Dr. Annamaria Boots 06/04/2020  Radiation oncology: Worthy Flank, PA 06/03/2020  Medical oncology: Dr. Lorna Few 06/03/2020  Procedures:  CT Head 06/02/2020  Chest x-ray 06/02/2020  MRI brain 06/04/2020  Ultrasound-guided core biopsy of right cervical adenopathy per IR, Dr. Annamaria Boots 06/04/2020--malignant melanoma  Antimicrobials:   None   Subjective: Patient laying in bed.  Sister at bedside.  Denies any chest pain.  States no significant change with shortness of breath however less visibly short of breath today.  Patient states it feels like he is breathing out of a pencil instead of a tube.  Stated he was told CPAP did not work as well overnight.  Currently on 6 L nasal cannula with sats of 96 to 100%.    Objective: Vitals:   06/07/20 0700 06/07/20 0749 06/07/20 0800 06/07/20 0905  BP: (!) 157/81  (!) 157/67 (!) 176/73  Pulse: 76  81   Resp: 18  17   Temp:   97.9 F (36.6 C)   TempSrc:   Oral   SpO2: 97% 98% 100%   Weight:      Height:        Intake/Output Summary (Last 24 hours) at 06/07/2020 1000 Last data  filed at 06/07/2020 0900 Gross per 24 hour  Intake --  Output 1075 ml  Net -1075 ml   Filed Weights   06/02/20 2146 06/06/20 0950 06/07/20 0500  Weight: 66.2 kg 72.2 kg 64.8 kg    Examination:  General exam: NAD Respiratory system: Decreased breath sounds in the bases.  Poor to fair air movement.  Some scattered coarse breath sounds.  No use of accessory muscles of respiration.  Speaking in full sentences.  Minimal to mild expiratory wheezing.  Cardiovascular system: Regular rate rhythm no murmurs rubs or gallops.  No JVD.  No lower extremity edema.  Gastrointestinal system: Abdomen is soft, nontender, nondistended, positive bowel sounds.  No rebound.  No guarding.   Central nervous system: Alert and oriented x 3. No focal neurological deficits. Extremities: Symmetric 5 x 5 power. Skin: No rashes, lesions or  ulcers Psychiatry: Judgement and insight appear normal. Mood & affect appropriate.     Data Reviewed: I have personally reviewed following labs and imaging studies  CBC: Recent Labs  Lab 06/02/20 2224 06/02/20 2233 06/03/20 0307 06/04/20 0237 06/05/20 0250 06/06/20 0245 06/07/20 0233  WBC 12.8*  --  12.2* 11.6* 14.0* 10.8* 8.8  NEUTROABS 10.2*  --   --  10.5* 13.0* 9.8* 8.3*  HGB 14.0   < > 12.6* 13.8 14.9 14.3 14.3  HCT 42.8   < > 37.9* 42.4 45.9 44.9 43.8  MCV 95.7  --  96.7 95.7 96.0 97.6 94.6  PLT 281  --  261 265 314 265 236   < > = values in this interval not displayed.    Basic Metabolic Panel: Recent Labs  Lab 06/04/20 0237 06/04/20 1217 06/05/20 0250 06/06/20 0245 06/07/20 0233  NA 140 139 144 139 141  K 4.8 4.7 4.4 4.9 4.8  CL 98 97* 99 94* 96*  CO2 33* 32 33* 37* 36*  GLUCOSE 435* 482* 229* 356* 276*  BUN 30* 33* 39* 42* 37*  CREATININE 0.72 0.83 0.85 0.97 0.68  CALCIUM 9.0 9.2 9.4 9.2 9.1  MG  --   --   --  2.1  --     GFR: Estimated Creatinine Clearance: 76.5 mL/min (by C-G formula based on SCr of 0.68 mg/dL).  Liver Function Tests: Recent Labs  Lab 06/02/20 2310 06/04/20 0237 06/05/20 0250 06/06/20 0245 06/07/20 0233  AST 24 21 21 21 20   ALT 15 15 17 18 23   ALKPHOS 117 103 108 113 106  BILITOT 1.2 0.8 0.8 0.8 0.9  PROT 6.5 6.0* 6.4* 5.9* 5.5*  ALBUMIN 3.8 3.3* 3.6 3.3* 3.2*    CBG: Recent Labs  Lab 06/06/20 0755 06/06/20 1204 06/06/20 1707 06/06/20 2104 06/07/20 0740  GLUCAP 319* 254* 138* 169* 301*     Recent Results (from the past 240 hour(s))  Resp Panel by RT-PCR (Flu A&B, Covid) Nasopharyngeal Swab     Status: None   Collection Time: 06/02/20 11:22 PM   Specimen: Nasopharyngeal Swab; Nasopharyngeal(NP) swabs in vial transport medium  Result Value Ref Range Status   SARS Coronavirus 2 by RT PCR NEGATIVE NEGATIVE Final    Comment: (NOTE) SARS-CoV-2 target nucleic acids are NOT DETECTED.  The SARS-CoV-2 RNA is  generally detectable in upper respiratory specimens during the acute phase of infection. The lowest concentration of SARS-CoV-2 viral copies this assay can detect is 138 copies/mL. A negative result does not preclude SARS-Cov-2 infection and should not be used as the sole basis for treatment or other patient management decisions. A  negative result may occur with  improper specimen collection/handling, submission of specimen other than nasopharyngeal swab, presence of viral mutation(s) within the areas targeted by this assay, and inadequate number of viral copies(<138 copies/mL). A negative result must be combined with clinical observations, patient history, and epidemiological information. The expected result is Negative.  Fact Sheet for Patients:  EntrepreneurPulse.com.au  Fact Sheet for Healthcare Providers:  IncredibleEmployment.be  This test is no t yet approved or cleared by the [place] FDA and  has been authorized for detection and/or diagnosis of SARS-CoV-2 by FDA under an Emergency Use Authorization (EUA). This EUA will remain  in effect (meaning this test can be used) for the duration of the COVID-19 declaration under Section 564(b)(1) of the Act, 21 U.S.C.section 360bbb-3(b)(1), unless the authorization is terminated  or revoked sooner.       Influenza A by PCR NEGATIVE NEGATIVE Final   Influenza B by PCR NEGATIVE NEGATIVE Final    Comment: (NOTE) The Xpert Xpress SARS-CoV-2/FLU/RSV plus assay is intended as an aid in the diagnosis of influenza from Nasopharyngeal swab specimens and should not be used as a sole basis for treatment. Nasal washings and aspirates are unacceptable for Xpert Xpress SARS-CoV-2/FLU/RSV testing.  Fact Sheet for Patients: EntrepreneurPulse.com.au  Fact Sheet for Healthcare Providers: IncredibleEmployment.be  This test is not yet approved or cleared by the [place] FDA and has been authorized for detection and/or diagnosis of SARS-CoV-2 by FDA under an Emergency Use Authorization (EUA). This EUA will remain in effect (meaning this test can be used) for the duration of the COVID-19 declaration under Section 564(b)(1) of the Act, 21 U.S.C. section 360bbb-3(b)(1), unless the authorization is terminated or revoked.  Performed at [facility], [place] [address] [place], [place] [address]   MRSA PCR Screening     Status: None   Collection Time: 06/03/20  8:41 AM   Specimen: Nasal Mucosa; Nasopharyngeal  Result Value Ref Range Status   MRSA by PCR NEGATIVE NEGATIVE Final    Comment:        The GeneXpert MRSA Assay (FDA approved for NASAL specimens only), is one component of a comprehensive MRSA colonization surveillance program. It is not intended to diagnose MRSA infection nor to guide or monitor treatment for MRSA infections. Performed at [facility], [place] [address], [place], [place] [address]          Radiology Studies: DG CHEST PORT 1 VIEW  Result Date: 06/06/2020 CLINICAL DATA:  Shortness of breath. EXAM: PORTABLE CHEST 1 VIEW COMPARISON:  June 02, 2020. FINDINGS: Stable right hilar and paratracheal adenopathy is noted. Stable appearance of right upper lobe lung mass is noted. No pneumothorax or pleural effusion is noted. Stable right upper and lower lobe opacities are noted concerning for possible scarring or inflammation. The visualized skeletal structures are unremarkable. IMPRESSION: Stable right hilar and paratracheal adenopathy. Stable appearance of right upper lobe lung mass. Stable right upper and lower lobe opacities are noted concerning for possible scarring or inflammation. Electronically Signed   By: Marijo Conception M.D.   On: 06/06/2020 11:42        Scheduled Meds: . (feeding supplement) PROSource Plus  30 mL Oral Daily  . allopurinol  300 mg Oral Daily  . budesonide  (PULMICORT) nebulizer solution  0.5 mg Nebulization BID  . chlorhexidine  15 mL Mouth Rinse BID  . Chlorhexidine Gluconate Cloth  6 each Topical Daily  . diltiazem  120 mg Oral Daily  .  feeding supplement (GLUCERNA SHAKE)  237 mL Oral BID BM  . flecainide  200 mg Oral BID  . guaiFENesin  1,200 mg Oral BID  . hydrochlorothiazide  12.5 mg Oral Daily  . hydroxychloroquine  200 mg Oral Daily  . insulin aspart  0-20 Units Subcutaneous TID WC  . insulin aspart  6 Units Subcutaneous TID WC  . [START ON 06/08/2020] insulin glargine  35 Units Subcutaneous Daily  . insulin glargine  5 Units Subcutaneous Once  . ipratropium-albuterol  3 mL Nebulization TID  . levothyroxine  88 mcg Oral Q0600  . loratadine  10 mg Oral Daily  . LORazepam  0.5 mg Oral Once  . mouth rinse  15 mL Mouth Rinse q12n4p  . methylPREDNISolone (SOLU-MEDROL) injection  125 mg Intravenous Q8H  . pantoprazole  40 mg Oral Daily  . polyethylene glycol  17 g Oral BID  . senna-docusate  1 tablet Oral BID   Continuous Infusions:   LOS: 5 days    Time spent: 40 minutes    Irine Seal, MD [facility]   To contact the attending provider between 7A-7P or the covering provider during after hours 7P-7A, please log into the web site [email or web address] and access using universal  password for that web site. If you do not have the password, please call the hospital operator.  06/07/2020, 10:00 AM

## 2020-06-07 NOTE — Progress Notes (Signed)
Per Mikey Bussing NP, I requested pathology to send BRAF testing off on Mr. Arthur [name] recent bx.

## 2020-06-08 DIAGNOSIS — R531 Weakness: Secondary | ICD-10-CM | POA: Diagnosis not present

## 2020-06-08 DIAGNOSIS — J9611 Chronic respiratory failure with hypoxia: Secondary | ICD-10-CM | POA: Diagnosis not present

## 2020-06-08 DIAGNOSIS — J9621 Acute and chronic respiratory failure with hypoxia: Secondary | ICD-10-CM | POA: Diagnosis not present

## 2020-06-08 DIAGNOSIS — C7931 Secondary malignant neoplasm of brain: Secondary | ICD-10-CM | POA: Diagnosis not present

## 2020-06-08 DIAGNOSIS — Z7189 Other specified counseling: Secondary | ICD-10-CM | POA: Diagnosis not present

## 2020-06-08 DIAGNOSIS — J441 Chronic obstructive pulmonary disease with (acute) exacerbation: Secondary | ICD-10-CM

## 2020-06-08 DIAGNOSIS — C799 Secondary malignant neoplasm of unspecified site: Secondary | ICD-10-CM | POA: Diagnosis not present

## 2020-06-08 LAB — GLUCOSE, CAPILLARY
Glucose-Capillary: 157 mg/dL — ABNORMAL HIGH (ref 70–99)
Glucose-Capillary: 260 mg/dL — ABNORMAL HIGH (ref 70–99)
Glucose-Capillary: 326 mg/dL — ABNORMAL HIGH (ref 70–99)
Glucose-Capillary: 347 mg/dL — ABNORMAL HIGH (ref 70–99)
Glucose-Capillary: 138 mg/dL — ABNORMAL HIGH (ref 70–99)

## 2020-06-08 LAB — BASIC METABOLIC PANEL
Anion gap: 12 (ref 5–15)
BUN: 40 mg/dL — ABNORMAL HIGH (ref 8–23)
CO2: 37 mmol/L — ABNORMAL HIGH (ref 22–32)
Calcium: 8.8 mg/dL — ABNORMAL LOW (ref 8.9–10.3)
Chloride: 93 mmol/L — ABNORMAL LOW (ref 98–111)
Creatinine, Ser: 0.79 mg/dL (ref 0.61–1.24)
GFR, Estimated: >60 mL/min (ref >60)
Glucose, Bld: 205 mg/dL — ABNORMAL HIGH (ref 70–99)
Potassium: 4 mmol/L (ref 3.5–5.1)
Sodium: 142 mmol/L (ref 135–145)

## 2020-06-08 MED ORDER — IPRATROPIUM-ALBUTEROL 0.5-2.5 (3) MG/3ML IN SOLN
3.0000 mL | RESPIRATORY_TRACT | Status: DC
  Administered 2020-06-09 – 2020-06-12 (×21): 3 mL via RESPIRATORY_TRACT
  Filled 2020-06-08 (×20): qty 3

## 2020-06-08 MED ORDER — IPRATROPIUM-ALBUTEROL 0.5-2.5 (3) MG/3ML IN SOLN
RESPIRATORY_TRACT | Status: AC
  Filled 2020-06-08: qty 3

## 2020-06-08 MED ORDER — METHYLPREDNISOLONE SODIUM SUCC 125 MG IJ SOLR
80.0000 mg | Freq: Three times a day (TID) | INTRAMUSCULAR | Status: DC
  Administered 2020-06-08 – 2020-06-10 (×6): 80 mg via INTRAVENOUS
  Filled 2020-06-08 (×6): qty 2

## 2020-06-08 MED ORDER — IPRATROPIUM-ALBUTEROL 0.5-2.5 (3) MG/3ML IN SOLN: 3.0000 mL | Freq: Four times a day (QID) | RESPIRATORY_TRACT | Status: DC | PRN

## 2020-06-08 MED ORDER — IPRATROPIUM-ALBUTEROL 0.5-2.5 (3) MG/3ML IN SOLN
3.0000 mL | RESPIRATORY_TRACT | Status: DC | PRN
  Administered 2020-06-11: 3 mL via RESPIRATORY_TRACT
  Filled 2020-06-08: qty 3

## 2020-06-08 NOTE — Progress Notes (Signed)
Received patient from ICU , VS obtained, telemetry monitor, fall and DNR bracelets applied, oriented to unit, call light placed in reach

## 2020-06-08 NOTE — Progress Notes (Signed)
PROGRESS NOTE    Arthur [name]  AYT:[id number] DOB: 1945/07/26 DOA: 06/02/2020 PCP: Bernerd Limbo, MD    Chief Complaint  Patient presents with  . 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 Ander Slade pulmonology Positive Cologuard-refusing work-up and 02/2019 Found to have right upper lobe lung mass after office visit January-not a great candidate for bronchoscopy given severe emphysema risk of pneumothorax etc. PET scan showed metastatic disease to left adrenal and bone in addition to nodules Presented to emergency room 2/13 secondary to fall causing insulin pump to fall out and sugars were in the 500 range Also stated weakness, inability to stand, being down on the ground CT head =1 cm solid mass inferior frontal lobe on the right side with edema and necrotic masses in the right hemisphere with surrounding edema with 2 mm right to left shift Oncology consulted patient to start Decadron Neurosurgeon Dr. Glenford Peers consulted did not advise any further imaging Hyperglycemia aggressively treated with IV insulin initially and transitioned Going for lymph node biopsies will need skilled placement   Assessment & Plan:   Principal Problem:   Metastatic melanoma ([place]) Active Problems:   Acute on chronic respiratory failure with hypoxia ([place])   Chronic respiratory failure with hypoxia (H[facility])   Brain metastases (HCC)   Hyperglycemia   Hyperkalemia   PAF (paroxysmal atrial fibrillation) (HCC)   HTN (hypertension)   Hypothyroidism   Protein calorie malnutrition ([place])   Malnutrition of moderate degree   Metastatic malignant neoplasm ([place])   Palliative care by specialist   Goals of care, counseling/discussion   General weakness  1 metastatic malignant melanoma /right upper lobe  lung mass with mediastinal and hilar adenopathy with probable adrenal mets, bone mets, brain metastases Patient presented with ataxia with difficulty standing.  Work-up done including head CT and MRI brain concerning for metastatic disease.  Concern for metastatic lung cancer.  Patient noted to be evaluated in the outpatient setting by pulmonary medicine and PET scan was performed May 21, 2020 with findings compatible with primary bronchogenic carcinoma with findings concerning for adrenal mets, bone mets.  Patient started on Decadron.  Patient seen in consultation by radiation oncology as well as medical oncology who recommended CT-guided biopsy of one of his lymph nodes per IR which was done on 06/04/2020 with pathology consistent with malignant melanoma.  Medical oncology, radiation oncology, neuro-oncology following and have discussed with patient and family on prognosis and palliative treatment with immunotherapy.  Patient thinking about recommendations to decide whether to proceed with treatment at this time.  Patient s/p 3T MRI scan (2/18) per radiation oncology and to evaluate whether patient is a candidate for SRS.  Per medical oncology/neuro-oncology/radiation oncology.  Palliative care also following and recommending palliative care to follow at SNF on discharge. This morning patient states he is still deciding on his treatment options but may be leaning towards hospice and if that is the case he would prefer a residential hospice facility however still in discussions with his sister.  2.  Acute on chronic respiratory failure secondary to severe COPD on 4 L nasal cannula at baseline in the setting of metastatic probable lung cancer/probable acute COPD exacerbation Patient currently on 4 L nasal cannula with sats around 92-94%.  Worsening respiratory status noted on 06/06/2020, likely secondary to  metastatic lung cancer/metastatic melanoma and probable acute COPD exacerbation.  Cervical biopsy  consistent with malignant melanoma..  Patient with some complaints of worsening shortness of breath from his baseline.  Patient less visibly short of breath today with no use of accessory muscles of respiration.  ABG obtained with a pH of 7.39, PCO2 of 64, PO2 of 92, acid base of 10.2, bicarb of 38.  Decrease IV Solu-Medrol to 80 mg every 8 hours.  Continue Mucinex, Pulmicort nebs, scheduled duo nebs, BiPAP nightly.  Chest PT.  Supportive care.  Follow.   3.  Paroxysmal atrial fibrillation Currently in sinus rhythm.  Continue Cardizem and Tambocor for rate control.  Not on anticoagulation.  Follow.   4.  Type 2 diabetes mellitus on insulin pump at home Patient noted to have pump not functioning well.  On presentation patient noted to be hypoglycemic requiring insulin drip and have subsequently been transitioned to subcutaneous Lantus, sliding scale insulin.  Patient with CBG of 260 this morning.  Continue Lantus 35 units daily.  Continue meal coverage NovoLog 6 units 3 times daily with meals.  Sliding scale insulin.  Monitor CBGs with steroid taper.  Diabetic coordinator following.   5.  Hypothyroidism Synthroid.   6.  Probable colon cancer Patient noted with concerns for possible colon cancer.  Patient will be a poor candidate for further work-up at this time.  Outpatient follow-up.  7.  Prior history of malignant melanoma Per oncology.  8.  Hypertension Continue HCTZ, Cardizem, torsemide.  Hydralazine as needed.  9.  Constipation MiraLAX twice daily, Senokot-S twice daily.      DVT prophylaxis: SCDs Code Status: DNR Family Communication: Updated patient.  No family at bedside.   Disposition:   Status is: Inpatient    Dispo: The patient is from: Home              Anticipated d/c is to: SNF with palliative care following              Anticipated d/c date is: To be determined.              Patient currently undergoing work-up for metastatic disease, on 4 L nasal cannula.  Not  stable for discharge   Difficult to place patient undetermined.       Consultants:   Palliative care: Dr. Rowe Pavy 06/05/2020  Interventional radiology: Dr. Annamaria Boots 06/04/2020  Radiation oncology: Worthy Flank, PA 06/03/2020  Medical oncology: Dr. Lorna Few 06/03/2020  Procedures:  CT Head 06/02/2020  Chest x-ray 06/02/2020  MRI brain 06/04/2020  Ultrasound-guided core biopsy of right cervical adenopathy per IR, Dr. Annamaria Boots 06/04/2020--malignant melanoma  MRI brain with and without contrast 06/07/2020  Antimicrobials:   None   Subjective: Patient states no significant change with shortness of breath however does not look as visibly short of breath as he did a few days ago.  Denies any chest pain.  No abdominal pain.  Stating he is still making a decision as to his treatment options and thinks he may be leaning towards hospice and if that is the case feels he will need a residential hospice facility.  States is not afraid to die.  States him and his sister are still thinking about his treatment options.  Had chest PT last night which she feels may have helped.  On 4 L nasal cannula with sats of 92 to 94%.    Objective: Vitals:   06/08/20 0500 06/08/20 0505 06/08/20 0629 06/08/20 0640  BP: 138/61  Pulse: 82 82    Resp: 17 17    Temp:      TempSrc:      SpO2: (!) 84% 90% 97%   Weight:    64.5 kg  Height:        Intake/Output Summary (Last 24 hours) at 06/08/2020 0932 Last data filed at 06/08/2020 0658 Gross per 24 hour  Intake 240 ml  Output 1450 ml  Net -1210 ml   Filed Weights   06/06/20 0950 06/07/20 0500 06/08/20 0640  Weight: 72.2 kg 64.8 kg 64.5 kg    Examination:  General exam: NAD Respiratory system: Minimal expiratory wheezing in the bases.  No crackles.  Fair air movement.  No use of accessory muscles of respiration.  Cardiovascular system: RRR no murmurs rubs or gallops.  No JVD.  No lower extremity edema.  Gastrointestinal system: Abdomen is  soft, nontender, nondistended, positive bowel sounds.  No rebound.  No guarding.  Central nervous system: Alert and oriented x 3. No focal neurological deficits. Extremities: Symmetric 5 x 5 power. Skin: No rashes, lesions or ulcers Psychiatry: Judgement and insight appear normal. Mood & affect appropriate.     Data Reviewed: I have personally reviewed following labs and imaging studies  CBC: Recent Labs  Lab 06/02/20 2224 06/02/20 2233 06/03/20 0307 06/04/20 0237 06/05/20 0250 06/06/20 0245 06/07/20 0233  WBC 12.8*  --  12.2* 11.6* 14.0* 10.8* 8.8  NEUTROABS 10.2*  --   --  10.5* 13.0* 9.8* 8.3*  HGB 14.0   < > 12.6* 13.8 14.9 14.3 14.3  HCT 42.8   < > 37.9* 42.4 45.9 44.9 43.8  MCV 95.7  --  96.7 95.7 96.0 97.6 94.6  PLT 281  --  261 265 314 265 236   < > = values in this interval not displayed.    Basic Metabolic Panel: Recent Labs  Lab 06/04/20 1217 06/05/20 0250 06/06/20 0245 06/07/20 0233 06/08/20 0227  NA 139 144 139 141 142  K 4.7 4.4 4.9 4.8 4.0  CL 97* 99 94* 96* 93*  CO2 32 33* 37* 36* 37*  GLUCOSE 482* 229* 356* 276* 205*  BUN 33* 39* 42* 37* 40*  CREATININE 0.83 0.85 0.97 0.68 0.79  CALCIUM 9.2 9.4 9.2 9.1 8.8*  MG  --   --  2.1  --   --     GFR: Estimated Creatinine Clearance: 76.1 mL/min (by C-G formula based on SCr of 0.79 mg/dL).  Liver Function Tests: Recent Labs  Lab 06/02/20 2310 06/04/20 0237 06/05/20 0250 06/06/20 0245 06/07/20 0233  AST 24 21 21 21 20   ALT 15 15 17 18 23   ALKPHOS 117 103 108 113 106  BILITOT 1.2 0.8 0.8 0.8 0.9  PROT 6.5 6.0* 6.4* 5.9* 5.5*  ALBUMIN 3.8 3.3* 3.6 3.3* 3.2*    CBG: Recent Labs  Lab 06/07/20 0740 06/07/20 1346 06/07/20 1640 06/07/20 2156 06/08/20 0757  GLUCAP 301* 216* 180* 157* 260*     Recent Results (from the past 240 hour(s))  Resp Panel by RT-PCR (Flu A&B, Covid) Nasopharyngeal Swab     Status: None   Collection Time: 06/02/20 11:22 PM   Specimen: Nasopharyngeal Swab;  Nasopharyngeal(NP) swabs in vial transport medium  Result Value Ref Range Status   SARS Coronavirus 2 by RT PCR NEGATIVE NEGATIVE Final    Comment: (NOTE) SARS-CoV-2 target nucleic acids are NOT DETECTED.  The SARS-CoV-2 RNA is generally detectable in upper respiratory specimens during the acute phase of infection. The  lowest concentration of SARS-CoV-2 viral copies this assay can detect is 138 copies/mL. A negative result does not preclude SARS-Cov-2 infection and should not be used as the sole basis for treatment or other patient management decisions. A negative result may occur with  improper specimen collection/handling, submission of specimen other than nasopharyngeal swab, presence of viral mutation(s) within the areas targeted by this assay, and inadequate number of viral copies(<138 copies/mL). A negative result must be combined with clinical observations, patient history, and epidemiological information. The expected result is Negative.  Fact Sheet for Patients:  EntrepreneurPulse.com.au  Fact Sheet for Healthcare Providers:  IncredibleEmployment.be  This test is no t yet approved or cleared by the [place] FDA and  has been authorized for detection and/or diagnosis of SARS-CoV-2 by FDA under an Emergency Use Authorization (EUA). This EUA will remain  in effect (meaning this test can be used) for the duration of the COVID-19 declaration under Section 564(b)(1) of the Act, 21 U.S.C.section 360bbb-3(b)(1), unless the authorization is terminated  or revoked sooner.       Influenza A by PCR NEGATIVE NEGATIVE Final   Influenza B by PCR NEGATIVE NEGATIVE Final    Comment: (NOTE) The Xpert Xpress SARS-CoV-2/FLU/RSV plus assay is intended as an aid in the diagnosis of influenza from Nasopharyngeal swab specimens and should not be used as a sole basis for treatment. Nasal washings and aspirates are unacceptable for Xpert Xpress  SARS-CoV-2/FLU/RSV testing.  Fact Sheet for Patients: EntrepreneurPulse.com.au  Fact Sheet for Healthcare Providers: IncredibleEmployment.be  This test is not yet approved or cleared by the [place] FDA and has been authorized for detection and/or diagnosis of SARS-CoV-2 by FDA under an Emergency Use Authorization (EUA). This EUA will remain in effect (meaning this test can be used) for the duration of the COVID-19 declaration under Section 564(b)(1) of the Act, 21 U.S.C. section 360bbb-3(b)(1), unless the authorization is terminated or revoked.  Performed at [facility], [place] [address] [place], [place] [address]   MRSA PCR Screening     Status: None   Collection Time: 06/03/20  8:41 AM   Specimen: Nasal Mucosa; Nasopharyngeal  Result Value Ref Range Status   MRSA by PCR NEGATIVE NEGATIVE Final    Comment:        The GeneXpert MRSA Assay (FDA approved for NASAL specimens only), is one component of a comprehensive MRSA colonization surveillance program. It is not intended to diagnose MRSA infection nor to guide or monitor treatment for MRSA infections. Performed at [facility], [place] [address], [place], [place] [address]          Radiology Studies: MR BRAIN W WO CONTRAST  Result Date: 06/07/2020 CLINICAL DATA:  Metastatic lung cancer EXAM: MRI HEAD WITHOUT AND WITH CONTRAST TECHNIQUE: Multiplanar, multiecho pulse sequences of the brain and surrounding structures were obtained without and with intravenous contrast. CONTRAST:  6.53mL GADAVIST GADOBUTROL 1 MMOL/ML IV SOLN COMPARISON:  06/04/2020 FINDINGS: Motion artifact is present. Brain: 7 stable parenchymal metastases are identified on series 1100, images 133, 162, 168, 192, and 217 as annotated. Larger lesions demonstrate associated susceptibility consistent with intralesional hemorrhage. Associated edema is similar with minor mass effect. No  acute infarction. No hydrocephalus. Additional patchy T2 hyperintensity in the supratentorial white matter likely reflecting stable chronic microvascular ischemic changes. Vascular: Major vessel flow voids at the skull base are preserved. Skull and upper cervical spine: Normal marrow signal is preserved. Sinuses/Orbits: Paranasal sinus mucosal thickening. Orbits are unremarkable. Other: Sella  is unremarkable.  Mastoid air cells are clear. IMPRESSION: Stable 7 metastatic lesions. Stable associated mild edema and mass effect. Electronically Signed   By: Macy Mis M.D.   On: 06/07/2020 15:51   DG CHEST PORT 1 VIEW  Result Date: 06/06/2020 CLINICAL DATA:  Shortness of breath. EXAM: PORTABLE CHEST 1 VIEW COMPARISON:  June 02, 2020. FINDINGS: Stable right hilar and paratracheal adenopathy is noted. Stable appearance of right upper lobe lung mass is noted. No pneumothorax or pleural effusion is noted. Stable right upper and lower lobe opacities are noted concerning for possible scarring or inflammation. The visualized skeletal structures are unremarkable. IMPRESSION: Stable right hilar and paratracheal adenopathy. Stable appearance of right upper lobe lung mass. Stable right upper and lower lobe opacities are noted concerning for possible scarring or inflammation. Electronically Signed   By: Marijo Conception M.D.   On: 06/06/2020 11:42        Scheduled Meds: . (feeding supplement) PROSource Plus  30 mL Oral Daily  . allopurinol  300 mg Oral Daily  . budesonide (PULMICORT) nebulizer solution  0.5 mg Nebulization BID  . chlorhexidine  15 mL Mouth Rinse BID  . Chlorhexidine Gluconate Cloth  6 each Topical Daily  . diltiazem  120 mg Oral Daily  . feeding supplement (GLUCERNA SHAKE)  237 mL Oral BID BM  . flecainide  200 mg Oral BID  . guaiFENesin  1,200 mg Oral BID  . hydrochlorothiazide  12.5 mg Oral Daily  . hydroxychloroquine  200 mg Oral Daily  . insulin aspart  0-20 Units Subcutaneous TID WC   . insulin aspart  6 Units Subcutaneous TID WC  . insulin glargine  35 Units Subcutaneous Daily  . ipratropium-albuterol  3 mL Nebulization TID  . levothyroxine  88 mcg Oral Q0600  . loratadine  10 mg Oral Daily  . mouth rinse  15 mL Mouth Rinse q12n4p  . methylPREDNISolone (SOLU-MEDROL) injection  125 mg Intravenous Q8H  . pantoprazole  40 mg Oral Daily  . polyethylene glycol  17 g Oral BID  . senna-docusate  1 tablet Oral BID  . torsemide  10 mg Oral Daily   Continuous Infusions:   LOS: 6 days    Time spent: 40 minutes    Irine Seal, MD [facility]   To contact the attending provider between 7A-7P or the covering provider during after hours 7P-7A, please log into the web site [email or web address] and access using universal [place] password for that web site. If you do not have the password, please call the hospital operator.  06/08/2020, 9:32 AM

## 2020-06-08 NOTE — Plan of Care (Signed)
  Problem: Education: Goal: Knowledge of General Education information will improve Description Including pain rating scale, medication(s)/side effects and non-pharmacologic comfort measures Outcome: Progressing   

## 2020-06-08 NOTE — Progress Notes (Signed)
3T MRI appears to show 7 lesions, still a candidate for fractionated SRS. Will discuss on Monday in brain oncology conference and start to coordinate this treatment.     Carola Rhine, PAC

## 2020-06-08 NOTE — Progress Notes (Signed)
Pt. placed on BiPAP DS by RN, tolerating well, RT set up oxygen/filled humidifier prior for h/s on first rounds.

## 2020-06-08 NOTE — Progress Notes (Signed)
Daily Progress Note   Patient Name: Arthur [name]       Date: 06/08/2020 DOB: 10-Dec-1945  Age: 75 y.o. MRN#: [id number] Attending Physician: Eugenie Filler, MD Primary Care Physician: Bernerd Limbo, MD Admit Date: 06/02/2020  Reason for Consultation/Follow-up: Establishing goals of care  Subjective: Arthur [name] is resting in bed.  His sister is the bedside received call from bedside RN that patient and sister wished to to continue goals of care discussions and to have more questions regarding hospice and palliative support in the outpatient setting.  Another family meeting held.  See below. Length of Stay: 6  Current Medications: Scheduled Meds:  . (feeding supplement) PROSource Plus  30 mL Oral Daily  . allopurinol  300 mg Oral Daily  . budesonide (PULMICORT) nebulizer solution  0.5 mg Nebulization BID  . chlorhexidine  15 mL Mouth Rinse BID  . Chlorhexidine Gluconate Cloth  6 each Topical Daily  . diltiazem  120 mg Oral Daily  . feeding supplement (GLUCERNA SHAKE)  237 mL Oral BID BM  . flecainide  200 mg Oral BID  . guaiFENesin  1,200 mg Oral BID  . hydrochlorothiazide  12.5 mg Oral Daily  . hydroxychloroquine  200 mg Oral Daily  . insulin aspart  0-20 Units Subcutaneous TID WC  . insulin aspart  6 Units Subcutaneous TID WC  . insulin glargine  35 Units Subcutaneous Daily  . ipratropium-albuterol  3 mL Nebulization TID  . levothyroxine  88 mcg Oral Q0600  . loratadine  10 mg Oral Daily  . mouth rinse  15 mL Mouth Rinse q12n4p  . methylPREDNISolone (SOLU-MEDROL) injection  80 mg Intravenous Q8H  . pantoprazole  40 mg Oral Daily  . polyethylene glycol  17 g Oral BID  . senna-docusate  1 tablet Oral BID  . torsemide  10 mg Oral Daily    Continuous Infusions:   PRN  Meds: dextrose, hydrALAZINE, ipratropium-albuterol, temazepam  Physical Exam         Patient appears fatigued and in mild respiratory distress Diminished breath sounds S1-S2 Abdomen is not distended Awake alert oriented no focal deficits Does  get cough and appears more dyspneic when speaking for several minutes at a time No edema   Vital Signs: BP (!) 171/70   Pulse 82   Temp (!) 97.4 F (36.3  C) (Oral)   Resp 17   Ht 5\' 10"  (1.778 m)   Wt 64.5 kg   SpO2 94%   BMI 20.40 kg/m  SpO2: SpO2: 94 % O2 Device: O2 Device: Nasal Cannula O2 Flow Rate: O2 Flow Rate (L/min): 4 L/min  Intake/output summary:   Intake/Output Summary (Last 24 hours) at 06/08/2020 1145 Last data filed at 06/08/2020 0800 Gross per 24 hour  Intake 600 ml  Output 1725 ml  Net -1125 ml   LBM: Last BM Date: 06/03/20 Baseline Weight: Weight: 66.2 kg Most recent weight: Weight: 64.5 kg       Palliative Assessment/Data:      Patient Active Problem List   Diagnosis Date Noted  . Metastatic melanoma ([place]) 06/07/2020  . Palliative care by specialist   . Goals of care, counseling/discussion   . General weakness   . Acute on chronic respiratory failure with hypoxia ([place]) 06/05/2020  . Metastatic malignant neoplasm ([place])   . Malnutrition of moderate degree 06/04/2020  . Hyperglycemia 06/03/2020  . Hyperkalemia 06/03/2020  . PAF (paroxysmal atrial fibrillation) ([place]) 06/03/2020  . HTN (hypertension) 06/03/2020  . Hypothyroidism 06/03/2020  . Protein calorie malnutrition ([place]) 06/03/2020  . Brain metastases ([place]) 06/02/2020  . Hemoptysis 05/07/2020  . OSA treated with BiPAP 05/07/2020  . Chronic respiratory failure with hypoxia ([place]) 11/15/2018  . COPD GOLD IV  11/14/2018    Palliative Care Assessment & Plan   Patient Profile:    Assessment: 75 year old gentleman recently diagnosed with malignant melanoma presented with right upper lobe lung mass, bulky hilar and mediastinal lymphadenopathy  also found to have metastatic disease to the brain bone and left adrenal gland. Medical oncology and radiation oncology are following.  Decisions pertaining to initiation of immunotherapy as well as palliative radiotherapy with SRS to brain lesions is being considered.   Palliative medicine consultation for ongoing goals of care discussions and for additional support.   Recommendations/Plan: Goals of care and family meeting with patient and his sister Arthur [name]: Patient and sister both state that the patient continues to think further about options.  Currently, patient is deliberating between going ahead with fractionated SRS and immunotherapy versus a purely hospice/palliative approach.  He asks again about differences between home with hospice versus residential hospice.  Offered active listening and supportive care.  Palliative will continue to follow.  Code Status:    Code Status Orders  (From admission, onward)         Start     Ordered   06/03/20 0030  Do not attempt resuscitation (DNR)  Continuous       Question Answer Comment  In the event of cardiac or respiratory ARREST Do not call a "code blue"   In the event of cardiac or respiratory ARREST Do not perform Intubation, CPR, defibrillation or ACLS   In the event of cardiac or respiratory ARREST Use medication by any route, position, wound care, and other measures to relive pain and suffering. May use oxygen, suction and manual treatment of airway obstruction as needed for comfort.      06/03/20 0029        Code Status History    Date Active Date Inactive Code Status Order ID Comments User Context   06/02/2020 2331 06/03/2020 0029 Full Code [id number]  Orene Desanctis, DO ED   Advance Care Planning Activity      Prognosis:  Unable to determine  Discharge Planning: To Be Determined  Care plan was discussed with  Patient and  sister.  Thank you for allowing the Palliative Medicine Team to assist in the care of this  patient.   Time In: 10 Time Out: 10.35 Total Time 35 Prolonged Time Billed  no       Greater than 50%  of this time was spent counseling and coordinating care related to the above assessment and plan.  Arthur Chance, MD  Please contact Palliative Medicine Team phone at [phone] for questions and concerns.

## 2020-06-09 DIAGNOSIS — J9621 Acute and chronic respiratory failure with hypoxia: Secondary | ICD-10-CM | POA: Diagnosis not present

## 2020-06-09 DIAGNOSIS — J9611 Chronic respiratory failure with hypoxia: Secondary | ICD-10-CM | POA: Diagnosis not present

## 2020-06-09 DIAGNOSIS — C799 Secondary malignant neoplasm of unspecified site: Secondary | ICD-10-CM | POA: Diagnosis not present

## 2020-06-09 DIAGNOSIS — C7931 Secondary malignant neoplasm of brain: Secondary | ICD-10-CM | POA: Diagnosis not present

## 2020-06-09 LAB — BASIC METABOLIC PANEL
Anion gap: 9 (ref 5–15)
BUN: 38 mg/dL — ABNORMAL HIGH (ref 8–23)
CO2: 39 mmol/L — ABNORMAL HIGH (ref 22–32)
Calcium: 8.4 mg/dL — ABNORMAL LOW (ref 8.9–10.3)
Chloride: 91 mmol/L — ABNORMAL LOW (ref 98–111)
Creatinine, Ser: 0.8 mg/dL (ref 0.61–1.24)
GFR, Estimated: >60 mL/min (ref >60)
Glucose, Bld: 288 mg/dL — ABNORMAL HIGH (ref 70–99)
Potassium: 4.2 mmol/L (ref 3.5–5.1)
Sodium: 139 mmol/L (ref 135–145)

## 2020-06-09 LAB — MAGNESIUM: Magnesium: 2.2 mg/dL (ref 1.7–2.4)

## 2020-06-09 LAB — GLUCOSE, CAPILLARY
Glucose-Capillary: 252 mg/dL — ABNORMAL HIGH (ref 70–99)
Glucose-Capillary: 377 mg/dL — ABNORMAL HIGH (ref 70–99)
Glucose-Capillary: 239 mg/dL — ABNORMAL HIGH (ref 70–99)
Glucose-Capillary: 170 mg/dL — ABNORMAL HIGH (ref 70–99)

## 2020-06-09 MED ORDER — MORPHINE SULFATE (CONCENTRATE) 10 MG/0.5ML PO SOLN
5.0000 mg | ORAL | Status: DC | PRN
  Administered 2020-06-09 – 2020-06-13 (×11): 5 mg via ORAL
  Filled 2020-06-09 (×11): qty 0.5

## 2020-06-09 MED ORDER — PSYLLIUM 95 % PO PACK
1.0000 | PACK | Freq: Every day | ORAL | Status: DC
  Administered 2020-06-09 – 2020-06-13 (×4): 1 via ORAL
  Filled 2020-06-09 (×5): qty 1

## 2020-06-09 MED ORDER — MORPHINE SULFATE 10 MG/5ML PO SOLN: 5.0000 mg | ORAL | Status: DC | PRN

## 2020-06-09 MED ORDER — INSULIN ASPART 100 UNIT/ML ~~LOC~~ SOLN
8.0000 [IU] | Freq: Three times a day (TID) | SUBCUTANEOUS | Status: DC
  Administered 2020-06-09 – 2020-06-10 (×3): 8 [IU] via SUBCUTANEOUS

## 2020-06-09 NOTE — Progress Notes (Addendum)
Hydrologist [facility]) Hospital Liaison: RN note    UPDATE: Sister Velta Addison is now wanting to look into facility or LTC for discharge. Pt is unable to care for himself so she is concerned about safety with discharging back home. I will update CSW/CM. ACC will hold on hospice and DME order until sister makes a decision. Please follow up in AM  Notified by Transition of Care Manger of patient/family request for [facility] services at home after discharge. Chart and patient information under review by [facility] physician. Hospice eligibility pending currently.    Writer spoke with Velta Addison to initiate education related to hospice philosophy, services and team approach to care.  [name] verbalized understanding of information given. Per discussion, plan is for discharge to [facility] home by [facility] once medically stable.     Please send signed and completed DNR form home with patient/family. Patient will need prescriptions for discharge comfort medications.     Per family request patient is incontinent and would like a foley catheter placed prior to discharge.   DME needs have been discussed, patient currently has the following equipment in the home: none.  Patient/family requests the following DME for delivery to the home:hospital bed, OBT, O2. DME will need to be ordered.  Home address has been verified and is different from within epic. [address] [place], [place] [address]. Velta Addison is the family member to contact to arrange time of delivery.     [facility] aware of the above. Please notify [facility] when patient is ready to leave the unit at discharge. (Call [phone] or [phone] after 5pm.) [facility] information and contact numbers given to [facility].      Please call with any hospice related questions.     Thank you for this referral.     Clementeen Hoof, BSN, [facility] (listed on AMION under Hospice and [place] [facility] [place])  [phone]

## 2020-06-09 NOTE — Progress Notes (Signed)
PROGRESS NOTE    Arthur [name]  NLZ:[id number] DOB: 1946-02-10 DOA: 06/02/2020 PCP: Bernerd Limbo, MD    Chief Complaint  Patient presents with  . 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 Ander Slade pulmonology Positive Cologuard-refusing work-up and 02/2019 Found to have right upper lobe lung mass after office visit January-not a great candidate for bronchoscopy given severe emphysema risk of pneumothorax etc. PET scan showed metastatic disease to left adrenal and bone in addition to nodules Presented to emergency room 2/13 secondary to fall causing insulin pump to fall out and sugars were in the 500 range Also stated weakness, inability to stand, being down on the ground CT head =1 cm solid mass inferior frontal lobe on the right side with edema and necrotic masses in the right hemisphere with surrounding edema with 2 mm right to left shift Oncology consulted patient to start Decadron Neurosurgeon Dr. Glenford Peers consulted did not advise any further imaging Hyperglycemia aggressively treated with IV insulin initially and transitioned Going for lymph node biopsies will need skilled placement   Assessment & Plan:   Principal Problem:   Metastatic melanoma ([place]) Active Problems:   Acute on chronic respiratory failure with hypoxia ([place])   Chronic respiratory failure with hypoxia (HCC)   Brain metastases (HCC)   Hyperglycemia   Hyperkalemia   PAF (paroxysmal atrial fibrillation) (HCC)   HTN (hypertension)   Hypothyroidism   Protein calorie malnutrition ([place])   Malnutrition of moderate degree   Metastatic malignant neoplasm ([place])   Palliative care by specialist   Goals of care, counseling/discussion   General weakness  1 metastatic malignant melanoma /right upper lobe  lung mass with mediastinal and hilar adenopathy with probable adrenal mets, bone mets, brain metastases Patient presented with ataxia with difficulty standing.  Work-up done including head CT and MRI brain concerning for metastatic disease.  Concern for metastatic lung cancer.  Patient noted to be evaluated in the outpatient setting by pulmonary medicine and PET scan was performed May 21, 2020 with findings compatible with primary bronchogenic carcinoma with findings concerning for adrenal mets, bone mets.  Patient started on Decadron.  Patient seen in consultation by radiation oncology as well as medical oncology who recommended CT-guided biopsy of one of his lymph nodes per IR which was done on 06/04/2020 with pathology consistent with malignant melanoma.  Medical oncology, radiation oncology, neuro-oncology following and have discussed with patient and family on prognosis and palliative treatment with immunotherapy.  Patient thinking about recommendations to decide whether to proceed with treatment at this time.  Patient s/p 3T MRI scan (2/18) per radiation oncology and to evaluate whether patient is a candidate for SRS.  Per medical oncology/neuro-oncology/radiation oncology.  Palliative care also following and recommending palliative care to follow at SNF on discharge. Patient still trying to decide on treatment options versus comfort measures.  Palliative care following.    2.  Acute on chronic respiratory failure secondary to severe COPD on 4 L nasal cannula at baseline in the setting of metastatic probable lung cancer/probable acute COPD exacerbation Patient currently on 4 L nasal cannula with sats around 98%.  Worsening respiratory status noted on 06/06/2020, likely secondary to metastatic lung cancer/metastatic melanoma and probable acute COPD exacerbation.  Cervical biopsy consistent with malignant melanoma..  Patient states no significant  change of chronic shortness of breath.  Patient less  visibly short of breath today with no use of accessory muscles of respiration.  ABG obtained with a pH of 7.39, PCO2 of 64, PO2 of 92, acid base of 10.2, bicarb of 38.  Decrease IV Solu-Medrol to 60 mg every 8 hours.  Continue Mucinex, Pulmicort nebs, scheduled duo nebs, BiPAP nightly.  Chest PT.  Supportive care.  Follow.   3.  Paroxysmal atrial fibrillation In NSR.  Continue Tambocor and Cardizem for rate control.  Not on anticoagulation.  Follow.    4.  Type 2 diabetes mellitus on insulin pump at home Patient noted to have pump not functioning well.  On presentation patient noted to be hypoglycemic requiring insulin drip and have subsequently been transitioned to subcutaneous Lantus, sliding scale insulin.  Patient with CBG of 252 this morning.  Continue Lantus 35 units daily.  Meal coverage NovoLog to 10 units 3 times daily with meals.  Sliding scale insulin.  Follow.  Diabetic coordinator following.   5.  Hypothyroidism Continue Synthroid.  6.  Probable colon cancer Patient noted with concerns for possible colon cancer.  Patient will be a poor candidate for further work-up at this time.  Outpatient follow-up.  7.  Prior history of malignant melanoma Per oncology.  8.  Hypertension Continue current regimen of Cardizem, torsemide.  HCTZ discontinued.  Hydralazine as needed.  Follow.    9.  Constipation No significant results on current regimen of MiraLAX twice daily, Senokot-S twice daily.  Patient asking for Metamucil which has been ordered per RN.  If no bowel movement will give a dose of oral sorbitol.  Follow.     DVT prophylaxis: SCDs Code Status: DNR Family Communication: Updated patient and sister at bedside. Disposition:   Status is: Inpatient    Dispo: The patient is from: Home              Anticipated d/c is to: SNF with palliative care following versus home with hospice versus residential hospice home              Anticipated d/c date is: To be determined.               Patient currently undergoing work-up for metastatic disease, on 4 L nasal cannula.  Not stable for discharge   Difficult to place patient undetermined.       Consultants:   Palliative care: Dr. Rowe Pavy 06/05/2020  Interventional radiology: Dr. Annamaria Boots 06/04/2020  Radiation oncology: Worthy Flank, PA 06/03/2020  Medical oncology: Dr. Lorna Few 06/03/2020  Procedures:  CT Head 06/02/2020  Chest x-ray 06/02/2020  MRI brain 06/04/2020  Ultrasound-guided core biopsy of right cervical adenopathy per IR, Dr. Annamaria Boots 06/04/2020--malignant melanoma  MRI brain with and without contrast 06/07/2020  Antimicrobials:   None   Subjective: Patient laying in bed.  States feels congested however unable to get mucus up.  No significant change with shortness of breath however moving air better and not visibly short of breath.  No chest pain.  No abdominal pain.  Still trying to decide between treatment versus comfort measures.  On 4 L nasal cannula.  Sister at bedside.  Patient with complaints of constipation asking for Metamucil.  Objective: Vitals:   06/09/20 0628 06/09/20 0823 06/09/20 0826 06/09/20 1127  BP: 130/67   (!) 143/68  Pulse: 74   85  Resp: 15   18  Temp: 98.1 F (36.7 C)   97.6 F (36.4 C)  TempSrc: Oral  Oral  SpO2: 90% (!) 87% 98%   Weight:      Height:        Intake/Output Summary (Last 24 hours) at 06/09/2020 1253 Last data filed at 06/09/2020 1226 Gross per 24 hour  Intake 480 ml  Output 575 ml  Net -95 ml   Filed Weights   06/07/20 0500 06/08/20 0640 06/09/20 0500  Weight: 64.8 kg 64.5 kg 68.5 kg    Examination:  General exam: NAD Respiratory system: Fair air movement.  No wheezing noted.  No crackles.  Some diffuse coarse breath sounds.  Speaking in full sentences.  No use of accessory muscles of respiration.   Cardiovascular system: Regular rate rhythm no murmurs rubs or gallops.  No JVD.  No lower extremity edema.  Gastrointestinal system:  Abdomen is soft, nontender, nondistended, positive bowel sounds.  No rebound.  No guarding.  Central nervous system: Alert and oriented x 3. No focal neurological deficits. Extremities: Symmetric 5 x 5 power. Skin: No rashes, lesions or ulcers Psychiatry: Judgement and insight appear normal. Mood & affect appropriate.     Data Reviewed: I have personally reviewed following labs and imaging studies  CBC: Recent Labs  Lab 06/02/20 2224 06/02/20 2233 06/03/20 0307 06/04/20 0237 06/05/20 0250 06/06/20 0245 06/07/20 0233  WBC 12.8*  --  12.2* 11.6* 14.0* 10.8* 8.8  NEUTROABS 10.2*  --   --  10.5* 13.0* 9.8* 8.3*  HGB 14.0   < > 12.6* 13.8 14.9 14.3 14.3  HCT 42.8   < > 37.9* 42.4 45.9 44.9 43.8  MCV 95.7  --  96.7 95.7 96.0 97.6 94.6  PLT 281  --  261 265 314 265 236   < > = values in this interval not displayed.    Basic Metabolic Panel: Recent Labs  Lab 06/05/20 0250 06/06/20 0245 06/07/20 0233 06/08/20 0227 06/09/20 0545  NA 144 139 141 142 139  K 4.4 4.9 4.8 4.0 4.2  CL 99 94* 96* 93* 91*  CO2 33* 37* 36* 37* 39*  GLUCOSE 229* 356* 276* 205* 288*  BUN 39* 42* 37* 40* 38*  CREATININE 0.85 0.97 0.68 0.79 0.80  CALCIUM 9.4 9.2 9.1 8.8* 8.4*  MG  --  2.1  --   --  2.2    GFR: Estimated Creatinine Clearance: 80.9 mL/min (by C-G formula based on SCr of 0.8 mg/dL).  Liver Function Tests: Recent Labs  Lab 06/02/20 2310 06/04/20 0237 06/05/20 0250 06/06/20 0245 06/07/20 0233  AST 24 21 21 21 20   ALT 15 15 17 18 23   ALKPHOS 117 103 108 113 106  BILITOT 1.2 0.8 0.8 0.8 0.9  PROT 6.5 6.0* 6.4* 5.9* 5.5*  ALBUMIN 3.8 3.3* 3.6 3.3* 3.2*    CBG: Recent Labs  Lab 06/08/20 0757 06/08/20 0945 06/08/20 1217 06/08/20 1709 06/09/20 0755  GLUCAP 260* 326* 347* 138* 252*     Recent Results (from the past 240 hour(s))  Resp Panel by RT-PCR (Flu A&B, Covid) Nasopharyngeal Swab     Status: None   Collection Time: 06/02/20 11:22 PM   Specimen: Nasopharyngeal  Swab; Nasopharyngeal(NP) swabs in vial transport medium  Result Value Ref Range Status   SARS Coronavirus 2 by RT PCR NEGATIVE NEGATIVE Final    Comment: (NOTE) SARS-CoV-2 target nucleic acids are NOT DETECTED.  The SARS-CoV-2 RNA is generally detectable in upper respiratory specimens during the acute phase of infection. The lowest concentration of SARS-CoV-2 viral copies this assay can detect is 138 copies/mL. A  negative result does not preclude SARS-Cov-2 infection and should not be used as the sole basis for treatment or other patient management decisions. A negative result may occur with  improper specimen collection/handling, submission of specimen other than nasopharyngeal swab, presence of viral mutation(s) within the areas targeted by this assay, and inadequate number of viral copies(<138 copies/mL). A negative result must be combined with clinical observations, patient history, and epidemiological information. The expected result is Negative.  Fact Sheet for Patients:  EntrepreneurPulse.com.au  Fact Sheet for Healthcare Providers:  IncredibleEmployment.be  This test is no t yet approved or cleared by the [place] FDA and  has been authorized for detection and/or diagnosis of SARS-CoV-2 by FDA under an Emergency Use Authorization (EUA). This EUA will remain  in effect (meaning this test can be used) for the duration of the COVID-19 declaration under Section 564(b)(1) of the Act, 21 U.S.C.section 360bbb-3(b)(1), unless the authorization is terminated  or revoked sooner.       Influenza A by PCR NEGATIVE NEGATIVE Final   Influenza B by PCR NEGATIVE NEGATIVE Final    Comment: (NOTE) The Xpert Xpress SARS-CoV-2/FLU/RSV plus assay is intended as an aid in the diagnosis of influenza from Nasopharyngeal swab specimens and should not be used as a sole basis for treatment. Nasal washings and aspirates are unacceptable for Xpert Xpress  SARS-CoV-2/FLU/RSV testing.  Fact Sheet for Patients: EntrepreneurPulse.com.au  Fact Sheet for Healthcare Providers: IncredibleEmployment.be  This test is not yet approved or cleared by the [place] FDA and has been authorized for detection and/or diagnosis of SARS-CoV-2 by FDA under an Emergency Use Authorization (EUA). This EUA will remain in effect (meaning this test can be used) for the duration of the COVID-19 declaration under Section 564(b)(1) of the Act, 21 U.S.C. section 360bbb-3(b)(1), unless the authorization is terminated or revoked.  Performed at [facility], [place] [address], [place], [place] [address]   MRSA PCR Screening     Status: None   Collection Time: 06/03/20  8:41 AM   Specimen: Nasal Mucosa; Nasopharyngeal  Result Value Ref Range Status   MRSA by PCR NEGATIVE NEGATIVE Final    Comment:        The GeneXpert MRSA Assay (FDA approved for NASAL specimens only), is one component of a comprehensive MRSA colonization surveillance program. It is not intended to diagnose MRSA infection nor to guide or monitor treatment for MRSA infections. Performed at [facility], [place] [address] [place], [place] [address]          Radiology Studies: No results found.      Scheduled Meds: . (feeding supplement) PROSource Plus  30 mL Oral Daily  . allopurinol  300 mg Oral Daily  . budesonide (PULMICORT) nebulizer solution  0.5 mg Nebulization BID  . chlorhexidine  15 mL Mouth Rinse BID  . diltiazem  120 mg Oral Daily  . feeding supplement (GLUCERNA SHAKE)  237 mL Oral BID BM  . flecainide  200 mg Oral BID  . guaiFENesin  1,200 mg Oral BID  . hydroxychloroquine  200 mg Oral Daily  . insulin aspart  0-20 Units Subcutaneous TID WC  . insulin aspart  8 Units Subcutaneous TID WC  . insulin glargine  35 Units Subcutaneous Daily  . ipratropium-albuterol  3 mL Nebulization Q4H  .  levothyroxine  88 mcg Oral Q0600  . loratadine  10 mg Oral Daily  . mouth rinse  15 mL Mouth Rinse q12n4p  . methylPREDNISolone (SOLU-MEDROL)  injection  80 mg Intravenous Q8H  . pantoprazole  40 mg Oral Daily  . polyethylene glycol  17 g Oral BID  . psyllium  1 packet Oral Daily  . senna-docusate  1 tablet Oral BID  . torsemide  10 mg Oral Daily   Continuous Infusions:   LOS: 7 days    Time spent: 40 minutes    Irine Seal, MD [facility]   To contact the attending provider between 7A-7P or the covering provider during after hours 7P-7A, please log into the web site [email or web address] and access using universal [place] password for that web site. If you do not have the password, please call the hospital operator.  06/09/2020, 12:53 PM

## 2020-06-09 NOTE — Progress Notes (Signed)
PMT no charge note  Placed TOC consult and also discussed with Melissa from [facility] hospice and palliative. Patient now on 4th floor, sister was on the phone, patient noted to be in no distress, appears weak. Requested [facility] reach out to patient and sister for further information regarding hospice/palliative support, what ever is in line with the patient's wishes at this time. Await further discussions between patient and ACC, overall plan likely for either home with hospice/palliative support versus residential hospice.   No charge Loistine Chance MD [facility] health palliative

## 2020-06-10 ENCOUNTER — Other Ambulatory Visit: Payer: Self-pay | Admitting: Student

## 2020-06-10 ENCOUNTER — Ambulatory Visit: Payer: Medicare Other | Admitting: Pulmonary Disease

## 2020-06-10 DIAGNOSIS — J9611 Chronic respiratory failure with hypoxia: Secondary | ICD-10-CM | POA: Diagnosis not present

## 2020-06-10 DIAGNOSIS — J9621 Acute and chronic respiratory failure with hypoxia: Secondary | ICD-10-CM | POA: Diagnosis not present

## 2020-06-10 DIAGNOSIS — C7931 Secondary malignant neoplasm of brain: Secondary | ICD-10-CM | POA: Diagnosis not present

## 2020-06-10 DIAGNOSIS — Z515 Encounter for palliative care: Secondary | ICD-10-CM | POA: Diagnosis not present

## 2020-06-10 DIAGNOSIS — C799 Secondary malignant neoplasm of unspecified site: Secondary | ICD-10-CM | POA: Diagnosis not present

## 2020-06-10 DIAGNOSIS — R531 Weakness: Secondary | ICD-10-CM | POA: Diagnosis not present

## 2020-06-10 LAB — GLUCOSE, CAPILLARY
Glucose-Capillary: 140 mg/dL — ABNORMAL HIGH (ref 70–99)
Glucose-Capillary: 168 mg/dL — ABNORMAL HIGH (ref 70–99)
Glucose-Capillary: 37 mg/dL — CL (ref 70–99)
Glucose-Capillary: 55 mg/dL — ABNORMAL LOW (ref 70–99)
Glucose-Capillary: 102 mg/dL — ABNORMAL HIGH (ref 70–99)
Glucose-Capillary: 87 mg/dL (ref 70–99)

## 2020-06-10 LAB — BASIC METABOLIC PANEL
Anion gap: 11 (ref 5–15)
BUN: 41 mg/dL — ABNORMAL HIGH (ref 8–23)
CO2: 39 mmol/L — ABNORMAL HIGH (ref 22–32)
Calcium: 8.3 mg/dL — ABNORMAL LOW (ref 8.9–10.3)
Chloride: 90 mmol/L — ABNORMAL LOW (ref 98–111)
Creatinine, Ser: 0.65 mg/dL (ref 0.61–1.24)
GFR, Estimated: >60 mL/min (ref >60)
Glucose, Bld: 194 mg/dL — ABNORMAL HIGH (ref 70–99)
Potassium: 4.2 mmol/L (ref 3.5–5.1)
Sodium: 140 mmol/L (ref 135–145)

## 2020-06-10 LAB — CBC
HCT: 45.4 % (ref 39.0–52.0)
Hemoglobin: 15 g/dL (ref 13.0–17.0)
MCH: 31.2 pg (ref 26.0–34.0)
MCHC: 33 g/dL (ref 30.0–36.0)
MCV: 94.4 fL (ref 80.0–100.0)
Platelets: 209 10*3/uL (ref 150–400)
RBC: 4.81 MIL/uL (ref 4.22–5.81)
RDW: 16 % — ABNORMAL HIGH (ref 11.5–15.5)
WBC: 12.6 10*3/uL — ABNORMAL HIGH (ref 4.0–10.5)
nRBC: 0 % (ref 0.0–0.2)

## 2020-06-10 LAB — MAGNESIUM: Magnesium: 2.4 mg/dL (ref 1.7–2.4)

## 2020-06-10 MED ORDER — INSULIN GLARGINE 100 UNIT/ML ~~LOC~~ SOLN
15.0000 [IU] | Freq: Every day | SUBCUTANEOUS | Status: DC
  Administered 2020-06-11: 15 [IU] via SUBCUTANEOUS
  Filled 2020-06-10: qty 0.15

## 2020-06-10 MED ORDER — SORBITOL 70 % SOLN
30.0000 mL | Freq: Once | Status: AC
  Administered 2020-06-10: 30 mL via ORAL
  Filled 2020-06-10: qty 30

## 2020-06-10 MED ORDER — METHYLPREDNISOLONE SODIUM SUCC 125 MG IJ SOLR
60.0000 mg | Freq: Three times a day (TID) | INTRAMUSCULAR | Status: DC
  Administered 2020-06-10 – 2020-06-11 (×3): 60 mg via INTRAVENOUS
  Filled 2020-06-10 (×4): qty 2

## 2020-06-10 NOTE — Plan of Care (Signed)
  Problem: Nutrition: Goal: Adequate nutrition will be maintained Outcome: Progressing   Problem: Pain Managment: Goal: General experience of comfort will improve Outcome: Progressing   Problem: Education: Goal: Knowledge of General Education information will improve Description: Including pain rating scale, medication(s)/side effects and non-pharmacologic comfort measures Outcome: Completed/Met

## 2020-06-10 NOTE — Progress Notes (Signed)
After meal CBG 102.  Diasha Castleman, Laurel Dimmer, RN

## 2020-06-10 NOTE — Progress Notes (Signed)
PROGRESS NOTE    Arthur [name]  QHU:[id number] DOB: 01-Jul-1945 DOA: 06/02/2020 PCP: Bernerd Limbo, MD    Chief Complaint  Patient presents with  . 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 Ander Slade pulmonology Positive Cologuard-refusing work-up and 02/2019 Found to have right upper lobe lung mass after office visit January-not a great candidate for bronchoscopy given severe emphysema risk of pneumothorax etc. PET scan showed metastatic disease to left adrenal and bone in addition to nodules Presented to emergency room 2/13 secondary to fall causing insulin pump to fall out and sugars were in the 500 range Also stated weakness, inability to stand, being down on the ground CT head =1 cm solid mass inferior frontal lobe on the right side with edema and necrotic masses in the right hemisphere with surrounding edema with 2 mm right to left shift Oncology consulted patient to start Decadron Neurosurgeon Dr. Glenford Peers consulted did not advise any further imaging Hyperglycemia aggressively treated with IV insulin initially and transitioned Going for lymph node biopsies will need skilled placement   Assessment & Plan:   Principal Problem:   Metastatic melanoma ([place]) Active Problems:   Acute on chronic respiratory failure with hypoxia ([place])   Chronic respiratory failure with hypoxia (HCC)   Brain metastases (HCC)   Hyperglycemia   Hyperkalemia   PAF (paroxysmal atrial fibrillation) (HCC)   HTN (hypertension)   Hypothyroidism   Protein calorie malnutrition ([place])   Malnutrition of moderate degree   Metastatic malignant neoplasm ([place])   Palliative care by specialist   Goals of care, counseling/discussion   General weakness  1 metastatic malignant melanoma /right upper lobe  lung mass with mediastinal and hilar adenopathy with probable adrenal mets, bone mets, brain metastases Patient presented with ataxia with difficulty standing.  Work-up done including head CT and MRI brain concerning for metastatic disease.  Concern for metastatic lung cancer.  Patient noted to be evaluated in the outpatient setting by pulmonary medicine and PET scan was performed May 21, 2020 with findings compatible with primary bronchogenic carcinoma with findings concerning for adrenal mets, bone mets.  Patient started on Decadron.  Patient seen in consultation by radiation oncology as well as medical oncology who recommended CT-guided biopsy of one of his lymph nodes per IR which was done on 06/04/2020 with pathology consistent with malignant melanoma.  Medical oncology, radiation oncology, neuro-oncology following and have discussed with patient and family on prognosis and palliative treatment with immunotherapy.  Patient thinking about recommendations to decide whether to proceed with treatment at this time.  Patient s/p 3T MRI scan (2/18) per radiation oncology and deemed to be a candidate for SRS. Per medical oncology/neuro-oncology/radiation oncology.  Palliative care also following and recommending palliative care to follow at SNF on discharge. Patient still trying to decide on treatment options versus comfort measures.  Palliative care following.    2.  Acute on chronic respiratory failure secondary to severe COPD on 4 L nasal cannula at baseline in the setting of metastatic probable lung cancer/probable acute COPD exacerbation Patient currently on 4 L nasal cannula with sats around 93%.  Worsening respiratory status noted on 06/06/2020, likely secondary to metastatic lung cancer/metastatic melanoma and probable acute COPD exacerbation.  Cervical biopsy consistent with malignant melanoma..  Patient states no significant change of chronic  shortness of breath.  Patient is visibly short of breath.   No use of accessory muscles of respiration.  ABG obtained with a pH of 7.39, PCO2 of 64, PO2 of 92, acid base of 10.2, bicarb of 38.  Decrease IV Solu-Medrol to 60 mg every 8 hours.  Continue Mucinex, Pulmicort nebs, scheduled duo nebs, BiPAP nightly.  Chest PT.  Supportive care.  Follow.   3.  Paroxysmal atrial fibrillation In NSR.  Continue Tambocor and Cardizem for rate control.  Not on anticoagulation.  Follow.    4.  Type 2 diabetes mellitus on insulin pump at home Patient noted to have pump not functioning well.  On presentation patient noted to be hypoglycemic requiring insulin drip and have subsequently been transitioned to subcutaneous Lantus, sliding scale insulin.  Patient with CBG 140 this morning.  Decrease Lantus to 15 units daily.  Discontinue meal coverage NovoLog.  Sliding scale insulin.  Monitor CBGs with steroid taper.  Diabetic coordinator following.   5.  Hypothyroidism Synthroid.   6.  Probable colon cancer Patient noted with concerns for possible colon cancer.  Patient will be a poor candidate for further work-up at this time.  Outpatient follow-up.  7.  Prior history of malignant melanoma Per oncology.  8.  Hypertension Continue current regimen of Cardizem, torsemide.  HCTZ discontinued.  Hydralazine as needed.  Follow.    9.  Constipation No significant results on current regimen of MiraLAX twice daily, Senokot-S twice daily, Metamucil.  Will order a dose of sorbitol.    DVT prophylaxis: SCDs Code Status: DNR Family Communication: Updated patient and sister at bedside. Disposition:   Status is: Inpatient    Dispo: The patient is from: Home              Anticipated d/c is to: SNF with palliative care following versus home with hospice versus residential hospice home              Anticipated d/c date is: To be determined.              Patient currently undergoing work-up for metastatic disease, on 4 L nasal cannula.  Not stable for discharge   Difficult  to place patient undetermined.       Consultants:   Palliative care: Dr. Rowe Pavy 06/05/2020  Interventional radiology: Dr. Annamaria Boots 06/04/2020  Radiation oncology: Worthy Flank, PA 06/03/2020  Medical oncology: Dr. Lorna Few 06/03/2020  Procedures:  CT Head 06/02/2020  Chest x-ray 06/02/2020  MRI brain 06/04/2020  Ultrasound-guided core biopsy of right cervical adenopathy per IR, Dr. Annamaria Boots 06/04/2020--malignant melanoma  MRI brain with and without contrast 06/07/2020  Antimicrobials:   None   Subjective: Patient laying in bed.  Stated chest PT somewhat helping with congestion.  No chest pain.  Still with shortness of breath however on 4 L nasal cannula.  Still contemplating on treatment versus comfort measures.  Stated morphine helped last night with air hunger.  Objective: Vitals:   06/09/20 2359 06/10/20 0322 06/10/20 0500 06/10/20 0722  BP:   (!) 154/82   Pulse:   80   Resp:      Temp:   97.7 F (36.5 C)   TempSrc:   Oral   SpO2: 94% 94% 93% 93%  Weight:   65.7 kg   Height:        Intake/Output Summary (Last 24 hours) at 06/10/2020 1303 Last data filed at 06/10/2020 0830 Gross per 24 hour  Intake -  Output 1300 ml  Net -  1300 ml   Filed Weights   06/08/20 0640 06/09/20 0500 06/10/20 0500  Weight: 64.5 kg 68.5 kg 65.7 kg    Examination:  General exam: NAD Respiratory system: Fair air movement.  Some scattered coarse breath sounds.  No wheezing.  No crackles.  No use of accessory muscles of respiration.  Cardiovascular system: RRR no murmurs rubs or gallops.  No JVD.  No lower extremity edema.  Gastrointestinal system: Abdomen is soft, nontender, nondistended, positive bowel sounds.  No rebound.  No guarding.  Central nervous system: Alert and oriented x 3. No focal neurological deficits. Extremities: Symmetric 5 x 5 power. Skin: No rashes, lesions or ulcers Psychiatry: Judgement and insight appear normal. Mood & affect appropriate.     Data  Reviewed: I have personally reviewed following labs and imaging studies  CBC: Recent Labs  Lab 06/04/20 0237 06/05/20 0250 06/06/20 0245 06/07/20 0233 06/10/20 0508  WBC 11.6* 14.0* 10.8* 8.8 12.6*  NEUTROABS 10.5* 13.0* 9.8* 8.3*  --   HGB 13.8 14.9 14.3 14.3 15.0  HCT 42.4 45.9 44.9 43.8 45.4  MCV 95.7 96.0 97.6 94.6 94.4  PLT 265 314 265 236 638    Basic Metabolic Panel: Recent Labs  Lab 06/06/20 0245 06/07/20 0233 06/08/20 0227 06/09/20 0545 06/10/20 0508  NA 139 141 142 139 140  K 4.9 4.8 4.0 4.2 4.2  CL 94* 96* 93* 91* 90*  CO2 37* 36* 37* 39* 39*  GLUCOSE 356* 276* 205* 288* 194*  BUN 42* 37* 40* 38* 41*  CREATININE 0.97 0.68 0.79 0.80 0.65  CALCIUM 9.2 9.1 8.8* 8.4* 8.3*  MG 2.1  --   --  2.2 2.4    GFR: Estimated Creatinine Clearance: 77.6 mL/min (by C-G formula based on SCr of 0.65 mg/dL).  Liver Function Tests: Recent Labs  Lab 06/04/20 0237 06/05/20 0250 06/06/20 0245 06/07/20 0233  AST 21 21 21 20   ALT 15 17 18 23   ALKPHOS 103 108 113 106  BILITOT 0.8 0.8 0.8 0.9  PROT 6.0* 6.4* 5.9* 5.5*  ALBUMIN 3.3* 3.6 3.3* 3.2*    CBG: Recent Labs  Lab 06/09/20 1253 06/09/20 1559 06/09/20 2113 06/10/20 0800 06/10/20 1136  GLUCAP 377* 239* 170* 140* 168*     Recent Results (from the past 240 hour(s))  Resp Panel by RT-PCR (Flu A&B, Covid) Nasopharyngeal Swab     Status: None   Collection Time: 06/02/20 11:22 PM   Specimen: Nasopharyngeal Swab; Nasopharyngeal(NP) swabs in vial transport medium  Result Value Ref Range Status   SARS Coronavirus 2 by RT PCR NEGATIVE NEGATIVE Final    Comment: (NOTE) SARS-CoV-2 target nucleic acids are NOT DETECTED.  The SARS-CoV-2 RNA is generally detectable in upper respiratory specimens during the acute phase of infection. The lowest concentration of SARS-CoV-2 viral copies this assay can detect is 138 copies/mL. A negative result does not preclude SARS-Cov-2 infection and should not be used as the sole  basis for treatment or other patient management decisions. A negative result may occur with  improper specimen collection/handling, submission of specimen other than nasopharyngeal swab, presence of viral mutation(s) within the areas targeted by this assay, and inadequate number of viral copies(<138 copies/mL). A negative result must be combined with clinical observations, patient history, and epidemiological information. The expected result is Negative.  Fact Sheet for Patients:  EntrepreneurPulse.com.au  Fact Sheet for Healthcare Providers:  IncredibleEmployment.be  This test is no t yet approved or cleared by the [place] FDA and  has been  authorized for detection and/or diagnosis of SARS-CoV-2 by FDA under an Emergency Use Authorization (EUA). This EUA will remain  in effect (meaning this test can be used) for the duration of the COVID-19 declaration under Section 564(b)(1) of the Act, 21 U.S.C.section 360bbb-3(b)(1), unless the authorization is terminated  or revoked sooner.       Influenza A by PCR NEGATIVE NEGATIVE Final   Influenza B by PCR NEGATIVE NEGATIVE Final    Comment: (NOTE) The Xpert Xpress SARS-CoV-2/FLU/RSV plus assay is intended as an aid in the diagnosis of influenza from Nasopharyngeal swab specimens and should not be used as a sole basis for treatment. Nasal washings and aspirates are unacceptable for Xpert Xpress SARS-CoV-2/FLU/RSV testing.  Fact Sheet for Patients: EntrepreneurPulse.com.au  Fact Sheet for Healthcare Providers: IncredibleEmployment.be  This test is not yet approved or cleared by the [place] FDA and has been authorized for detection and/or diagnosis of SARS-CoV-2 by FDA under an Emergency Use Authorization (EUA). This EUA will remain in effect (meaning this test can be used) for the duration of the COVID-19 declaration under Section 564(b)(1) of the Act,  21 U.S.C. section 360bbb-3(b)(1), unless the authorization is terminated or revoked.  Performed at [facility], [place] [address] [place], [place] [address]   MRSA PCR Screening     Status: None   Collection Time: 06/03/20  8:41 AM   Specimen: Nasal Mucosa; Nasopharyngeal  Result Value Ref Range Status   MRSA by PCR NEGATIVE NEGATIVE Final    Comment:        The GeneXpert MRSA Assay (FDA approved for NASAL specimens only), is one component of a comprehensive MRSA colonization surveillance program. It is not intended to diagnose MRSA infection nor to guide or monitor treatment for MRSA infections. Performed at [facility], [place] [address], [place], [place] [address]          Radiology Studies: No results found.      Scheduled Meds: . (feeding supplement) PROSource Plus  30 mL Oral Daily  . allopurinol  300 mg Oral Daily  . budesonide (PULMICORT) nebulizer solution  0.5 mg Nebulization BID  . chlorhexidine  15 mL Mouth Rinse BID  . diltiazem  120 mg Oral Daily  . feeding supplement (GLUCERNA SHAKE)  237 mL Oral BID BM  . flecainide  200 mg Oral BID  . guaiFENesin  1,200 mg Oral BID  . hydroxychloroquine  200 mg Oral Daily  . insulin aspart  0-20 Units Subcutaneous TID WC  . insulin aspart  8 Units Subcutaneous TID WC  . insulin glargine  35 Units Subcutaneous Daily  . ipratropium-albuterol  3 mL Nebulization Q4H  . levothyroxine  88 mcg Oral Q0600  . loratadine  10 mg Oral Daily  . mouth rinse  15 mL Mouth Rinse q12n4p  . methylPREDNISolone (SOLU-MEDROL) injection  60 mg Intravenous Q8H  . pantoprazole  40 mg Oral Daily  . polyethylene glycol  17 g Oral BID  . psyllium  1 packet Oral Daily  . senna-docusate  1 tablet Oral BID  . torsemide  10 mg Oral Daily   Continuous Infusions:   LOS: 8 days    Time spent: 40 minutes    Irine Seal, MD [facility]   To contact the attending provider between 7A-7P  or the covering provider during after hours 7P-7A, please log into the web site [email or web address] and access using universal [place] password for that web site. If you do not have  the password, please call the hospital operator.  06/10/2020, 1:03 PM

## 2020-06-10 NOTE — Progress Notes (Signed)
I spoke with the patient and his sister today and we reviewed the MRI results and discussion from conference this morning. He remains a candidate for SRS with fractionated scheme to the larger lesions, and single fraction to the others. He is still struggling with what he would like to do in terms of treatment. His grip strength is getting better R>L, and his cognitive abilities remain strong. He was switched to methylprednisolone from dexamethasone over the weekend. He would like to speak with Dr. Mickeal Skinner another time prior to making his decision regarding treatment. I have reserved time on our schedule tomorrow for simulation in case he wants to move forward with SRS. He is in agreement and we will follow up again tomorrow to see how he'd like to proceed.    Carola Rhine, PAC

## 2020-06-10 NOTE — Progress Notes (Signed)
Daily Progress Note   Patient Name: Arthur [name]       Date: 06/10/2020 DOB: [birth year]  Age: 75 y.o. MRN#: [id number] Attending Physician: Eugenie Filler, MD Primary Care Physician: Bernerd Limbo, MD Admit Date: 06/02/2020  Reason for Consultation/Follow-up: Establishing goals of care  Subjective: Arthur [name] is resting in bed.  His sister is the bedside, patient and sister continue to receive information from staff regarding next steps and they continue to have goals of care discussions amongst themselves.  Patient now becoming more dyspneic, has been started on low-dose opioids since 06-09-20.   Length of Stay: 8  Current Medications: Scheduled Meds:  . (feeding supplement) PROSource Plus  30 mL Oral Daily  . allopurinol  300 mg Oral Daily  . budesonide (PULMICORT) nebulizer solution  0.5 mg Nebulization BID  . chlorhexidine  15 mL Mouth Rinse BID  . diltiazem  120 mg Oral Daily  . feeding supplement (GLUCERNA SHAKE)  237 mL Oral BID BM  . flecainide  200 mg Oral BID  . guaiFENesin  1,200 mg Oral BID  . hydroxychloroquine  200 mg Oral Daily  . insulin aspart  0-20 Units Subcutaneous TID WC  . insulin aspart  8 Units Subcutaneous TID WC  . insulin glargine  35 Units Subcutaneous Daily  . ipratropium-albuterol  3 mL Nebulization Q4H  . levothyroxine  88 mcg Oral Q0600  . loratadine  10 mg Oral Daily  . mouth rinse  15 mL Mouth Rinse q12n4p  . methylPREDNISolone (SOLU-MEDROL) injection  60 mg Intravenous Q8H  . pantoprazole  40 mg Oral Daily  . polyethylene glycol  17 g Oral BID  . psyllium  1 packet Oral Daily  . senna-docusate  1 tablet Oral BID  . torsemide  10 mg Oral Daily    Continuous Infusions:   PRN Meds: dextrose, hydrALAZINE, ipratropium-albuterol, morphine  CONCENTRATE, temazepam  Physical Exam         Patient appears fatigued and in mild respiratory distress Diminished breath sounds S1-S2 Abdomen is not distended Awake alert oriented no focal deficits Does  get cough and appears more dyspneic when speaking for several minutes at a time No edema   Vital Signs: BP (!) 148/78 (BP Location: Left Arm)   Pulse 87   Temp (!) 97.5 F (36.4 C) (Oral)  Resp (!) 22   Ht 5\' 10"  (1.778 m)   Wt 65.7 kg   SpO2 94%   BMI 20.78 kg/m  SpO2: SpO2: 94 % O2 Device: O2 Device: Nasal Cannula O2 Flow Rate: O2 Flow Rate (L/min): 4 L/min (4 lpm dep)  Intake/output summary:   Intake/Output Summary (Last 24 hours) at 06/10/2020 1447 Last data filed at 06/10/2020 0830 Gross per 24 hour  Intake --  Output 1300 ml  Net -1300 ml   LBM: Last BM Date: 06/03/20 Baseline Weight: Weight: 66.2 kg Most recent weight: Weight: 65.7 kg       Palliative Assessment/Data:      Patient Active Problem List   Diagnosis Date Noted  . Metastatic melanoma ([place]) 06/07/2020  . Palliative care by specialist   . Goals of care, counseling/discussion   . General weakness   . Acute on chronic respiratory failure with hypoxia ([place]) 06/05/2020  . Metastatic malignant neoplasm ([place])   . Malnutrition of moderate degree 06/04/2020  . Hyperglycemia 06/03/2020  . Hyperkalemia 06/03/2020  . PAF (paroxysmal atrial fibrillation) ([place]) 06/03/2020  . HTN (hypertension) 06/03/2020  . Hypothyroidism 06/03/2020  . Protein calorie malnutrition ([place]) 06/03/2020  . Brain metastases ([place]) 06/02/2020  . Hemoptysis 05/07/2020  . OSA treated with BiPAP 05/07/2020  . Chronic respiratory failure with hypoxia ([place]) 11/15/2018  . COPD GOLD IV  11/14/2018    Palliative Care Assessment & Plan   Patient Profile:    Assessment: 75 year old gentleman recently diagnosed with malignant melanoma presented with right upper lobe lung mass, bulky hilar and mediastinal lymphadenopathy also  found to have metastatic disease to the brain bone and left adrenal gland. Medical oncology and radiation oncology are following.  Decisions pertaining to initiation of immunotherapy as well as palliative radiotherapy with SRS to brain lesions is being considered.   Palliative medicine consultation for ongoing goals of care discussions and for additional support.   Recommendations/Plan: Goals of care and family meeting with patient and his sister Arthur [name]: Patient and sister both state that the patient continues to think further about options.  Currently, patient is deliberating between going ahead with fractionated SRS and immunotherapy versus a purely hospice/palliative approach.  He asks again about differences between home with hospice versus residential hospice.  Offered active listening and supportive care.  Palliative will continue to follow.  We rediscussed again about differences between home with palliative versus home with hospice versus residential hospice.  Overall, patient with good oral intake for now however he is having escalating symptom burden.  If he decides to forego treatments, patient sister wants more information from [facility] about the type of arrangements that can be made for home with hospice.  PMT to follow.  Code Status:    Code Status Orders  (From admission, onward)         Start     Ordered   06/03/20 0030  Do not attempt resuscitation (DNR)  Continuous       Question Answer Comment  In the event of cardiac or respiratory ARREST Do not call a "code blue"   In the event of cardiac or respiratory ARREST Do not perform Intubation, CPR, defibrillation or ACLS   In the event of cardiac or respiratory ARREST Use medication by any route, position, wound care, and other measures to relive pain and suffering. May use oxygen, suction and manual treatment of airway obstruction as needed for comfort.      06/03/20 0029  Code Status History    Date Active Date  Inactive Code Status Order ID Comments User Context   06/02/2020 2331 06/03/2020 0029 Full Code [id number]  Orene Desanctis, DO ED   Advance Care Planning Activity      Prognosis:  Unable to determine  Discharge Planning: To Be Determined  Care plan was discussed with  Patient and sister.  Thank you for allowing the Palliative Medicine Team to assist in the care of this patient.   Time In: 1300 Time Out: 1335 Total Time 35 Prolonged Time Billed  no       Greater than 50%  of this time was spent counseling and coordinating care related to the above assessment and plan.  Loistine Chance, MD  Please contact Palliative Medicine Team phone at [phone] for questions and concerns.

## 2020-06-10 NOTE — Progress Notes (Signed)
[facility])     This patient has been referred for hospice services in the community.  [facility] will continue to follow for any discharge planning needs and to coordinate admission onto hospice  Care if desired by pt/family.    Thank you for the opportunity to participate in this patient's care.     Domenic Moras, BSN, RN [facility] Liaison   [phone] (24h on call)

## 2020-06-10 NOTE — Progress Notes (Signed)
Hypoglycemic Event  CBG: 39  Treatment: 8 oz juice/soda  Symptoms: None  Follow-up CBG: Time 1708 CBG Result:55  Possible Reasons for Event: Inadequate meal intake   Pt will have evening meal, then recheck.    Stacey Drain

## 2020-06-10 NOTE — Care Management Important Message (Signed)
Important Message  Patient Details IM Letter given to the Patient. Name: Arthur [name] MRN: [id number] Date of Birth: Aug 21, 1945   Medicare Important Message Given:  Yes     Kerin Salen 06/10/2020, 12:41 PM

## 2020-06-10 NOTE — TOC Progression Note (Signed)
Transition of Care [facility]) - Progression Note    Patient Details  Name: Martavion Couper MRN: [id number] Date of Birth: 05/27/1945  Transition of Care [facility]) CM/SW Contact  Comfort Iversen, Juliann Pulse, RN Phone Number: 06/10/2020, 4:19 PM  Clinical Narrative: Damaris Schooner to sister-[name]-still undecided about d/c plan-she will discuss with patient to confirm-home w/hospice vs SNF-has bed offers.She will also talk to palliative care team about GOC-xrt. Await decision on d/c plan. [facility] care following.      Expected Discharge Plan: Home w Hospice Care Barriers to Discharge: Continued Medical Work up  Expected Discharge Plan and Services Expected Discharge Plan: [place]   Discharge Planning Services: CM Consult Post Acute Care Choice: [place] arrangements for the past 2 months: Single Family Home                                       Social Determinants of Health (SDOH) Interventions    Readmission Risk Interventions No flowsheet data found.

## 2020-06-11 ENCOUNTER — Ambulatory Visit (HOSPITAL_COMMUNITY): Admission: RE | Admit: 2020-06-11 | Payer: Medicare Other | Source: Ambulatory Visit

## 2020-06-11 ENCOUNTER — Encounter: Payer: Self-pay | Admitting: *Deleted

## 2020-06-11 DIAGNOSIS — J9611 Chronic respiratory failure with hypoxia: Secondary | ICD-10-CM | POA: Diagnosis not present

## 2020-06-11 DIAGNOSIS — J9621 Acute and chronic respiratory failure with hypoxia: Secondary | ICD-10-CM | POA: Diagnosis not present

## 2020-06-11 DIAGNOSIS — Z515 Encounter for palliative care: Secondary | ICD-10-CM | POA: Diagnosis not present

## 2020-06-11 DIAGNOSIS — C799 Secondary malignant neoplasm of unspecified site: Secondary | ICD-10-CM | POA: Diagnosis not present

## 2020-06-11 DIAGNOSIS — Z7189 Other specified counseling: Secondary | ICD-10-CM | POA: Diagnosis not present

## 2020-06-11 DIAGNOSIS — C7931 Secondary malignant neoplasm of brain: Secondary | ICD-10-CM | POA: Diagnosis not present

## 2020-06-11 LAB — CBC
HCT: 46.5 % (ref 39.0–52.0)
Hemoglobin: 15.3 g/dL (ref 13.0–17.0)
MCH: 31.2 pg (ref 26.0–34.0)
MCHC: 32.9 g/dL (ref 30.0–36.0)
MCV: 94.9 fL (ref 80.0–100.0)
Platelets: 229 10*3/uL (ref 150–400)
RBC: 4.9 MIL/uL (ref 4.22–5.81)
RDW: 16.1 % — ABNORMAL HIGH (ref 11.5–15.5)
WBC: 14.6 10*3/uL — ABNORMAL HIGH (ref 4.0–10.5)
nRBC: 0 % (ref 0.0–0.2)

## 2020-06-11 LAB — GLUCOSE, CAPILLARY
Glucose-Capillary: 39 mg/dL — CL (ref 70–99)
Glucose-Capillary: 183 mg/dL — ABNORMAL HIGH (ref 70–99)
Glucose-Capillary: 351 mg/dL — ABNORMAL HIGH (ref 70–99)
Glucose-Capillary: 297 mg/dL — ABNORMAL HIGH (ref 70–99)
Glucose-Capillary: 261 mg/dL — ABNORMAL HIGH (ref 70–99)
Glucose-Capillary: 292 mg/dL — ABNORMAL HIGH (ref 70–99)

## 2020-06-11 LAB — BASIC METABOLIC PANEL
Anion gap: 12 (ref 5–15)
BUN: 39 mg/dL — ABNORMAL HIGH (ref 8–23)
CO2: 38 mmol/L — ABNORMAL HIGH (ref 22–32)
Calcium: 8.3 mg/dL — ABNORMAL LOW (ref 8.9–10.3)
Chloride: 89 mmol/L — ABNORMAL LOW (ref 98–111)
Creatinine, Ser: 0.92 mg/dL (ref 0.61–1.24)
GFR, Estimated: >60 mL/min (ref >60)
Glucose, Bld: 206 mg/dL — ABNORMAL HIGH (ref 70–99)
Potassium: 4.6 mmol/L (ref 3.5–5.1)
Sodium: 139 mmol/L (ref 135–145)

## 2020-06-11 MED ORDER — INSULIN ASPART 100 UNIT/ML ~~LOC~~ SOLN
3.0000 [IU] | Freq: Once | SUBCUTANEOUS | Status: AC
  Administered 2020-06-11: 3 [IU] via SUBCUTANEOUS

## 2020-06-11 MED ORDER — METHYLPREDNISOLONE SODIUM SUCC 125 MG IJ SOLR
60.0000 mg | Freq: Two times a day (BID) | INTRAMUSCULAR | Status: DC
  Administered 2020-06-11 – 2020-06-13 (×5): 60 mg via INTRAVENOUS
  Filled 2020-06-11 (×5): qty 2

## 2020-06-11 MED ORDER — INSULIN GLARGINE 100 UNIT/ML ~~LOC~~ SOLN
25.0000 [IU] | Freq: Every day | SUBCUTANEOUS | Status: DC
  Administered 2020-06-12 – 2020-06-13 (×2): 25 [IU] via SUBCUTANEOUS
  Filled 2020-06-11 (×2): qty 0.25

## 2020-06-11 MED ORDER — INSULIN GLARGINE 100 UNIT/ML ~~LOC~~ SOLN
10.0000 [IU] | Freq: Once | SUBCUTANEOUS | Status: AC
  Administered 2020-06-11: 10 [IU] via SUBCUTANEOUS
  Filled 2020-06-11: qty 0.1

## 2020-06-11 MED ORDER — INSULIN ASPART 100 UNIT/ML ~~LOC~~ SOLN
6.0000 [IU] | Freq: Three times a day (TID) | SUBCUTANEOUS | Status: DC
  Administered 2020-06-12 – 2020-06-13 (×6): 6 [IU] via SUBCUTANEOUS

## 2020-06-11 MED ORDER — TORSEMIDE 10 MG PO TABS
10.0000 mg | ORAL_TABLET | Freq: Every day | ORAL | Status: DC
Start: 2020-06-12 — End: 2020-06-14
  Administered 2020-06-12 – 2020-06-13 (×2): 10 mg via ORAL
  Filled 2020-06-11 (×2): qty 1

## 2020-06-11 NOTE — Progress Notes (Signed)
Nutrition Follow-up  DOCUMENTATION CODES:   Non-severe (moderate) malnutrition in context of chronic illness  INTERVENTION:  - continue Glucerna Shake BID and 30 ml Prosource Plus once/day.   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 -ongoing  GOAL:   Patient will meet greater than or equal to 90% of their needs -minimally met on average   MONITOR:   Diet advancement,PO intake,Supplement acceptance,Labs,Weight trends  ASSESSMENT:   75 y.o. 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 He presented to the ED due to hyperglycemia. He reported difficulty ambulating for several days PTA and experienced a fall the night PTA.  The only documented intakes since admission were 85% of breakfast on 2/18; 75% of breakfast on 2/20; 75% of lunch and 80% of dinner on 2/21.   He has been accepting Prosource ~50% of the time offered and Glucerna Shake ~90% of the time offered.   Weight has been fluctuating some throughout hospitalization within a 10 lb range. No information documented in the edema section of flow sheet.   Palliative Care is following and plan is for d/c home with hospice when ready for d/c.    Labs reviewed; CBGs: 183 and 351 mg/dl, Cl: 89 mmol/l, BUN: 39 mg/dl, Ca: 8.3 mg/dl.  Medications reviewed; sliding scale novolog, 15 units lantus/day, 88 mcg oral synthroid/day, 60 mg solu-medrol BID, 17 g miralax BID, 1 packet metamucil/day.    Diet Order:   Diet Order            Diet Carb Modified Fluid consistency: Thin; Room service appropriate? Yes  Diet effective now                  EDUCATION NEEDS:   Education needs have been addressed  Skin:  Skin Assessment: Reviewed RN Assessment (L knee abrasion)  Last BM:  2/14  Height:   Ht Readings from Last 1 Encounters:  06/03/20 _0  (1.778 m)    Weight:   Wt Readings from Last 1 Encounters:  06/11/20 67.4 kg    Estimated Nutritional Needs:  Kcal:  1990-2185 kcal Protein:  110-125 grams Fluid:  >/= 2.2 L/day      Arthur Matin, MS, RD, LDN, CNSC Inpatient Clinical Dietitian RD pager # available in [facility]  After hours/weekend pager # available in [facility]

## 2020-06-11 NOTE — Progress Notes (Signed)
I was notified by pathology that they had a hard time finding slides to send out for BRAF that was requested on 2/18.  They have located slides and will send out today.

## 2020-06-11 NOTE — Plan of Care (Signed)
  Problem: Health Behavior/Discharge Planning: Goal: Ability to manage health-related needs will improve Outcome: Progressing   Problem: Clinical Measurements: Goal: Diagnostic test results will improve Outcome: Progressing   Problem: Nutrition: Goal: Adequate nutrition will be maintained Outcome: Progressing   Problem: Coping: Goal: Level of anxiety will decrease Outcome: Progressing   Problem: Pain Managment: Goal: General experience of comfort will improve Outcome: Progressing

## 2020-06-11 NOTE — Progress Notes (Signed)
Pt found in bed with increased SOB, cyanotic and O2sat of 70%. RN increased pt's oxygen to 10L and raised HOB. Pt's vital signs has stabilized(wasn't transferred to pt chart). Pt current O2sat is 92%. [facility] hospitalist NP [place] made aware.

## 2020-06-11 NOTE — Progress Notes (Addendum)
PROGRESS NOTE    Arthur [name]  DZH:[id number] DOB: 1946-04-01 DOA: 06/02/2020 PCP: Bernerd Limbo, MD    Chief Complaint  Patient presents with  . 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 Ander Slade pulmonology Positive Cologuard-refusing work-up and 02/2019 Found to have right upper lobe lung mass after office visit January-not a great candidate for bronchoscopy given severe emphysema risk of pneumothorax etc. PET scan showed metastatic disease to left adrenal and bone in addition to nodules Presented to emergency room 2/13 secondary to fall causing insulin pump to fall out and sugars were in the 500 range Also stated weakness, inability to stand, being down on the ground CT head =1 cm solid mass inferior frontal lobe on the right side with edema and necrotic masses in the right hemisphere with surrounding edema with 2 mm right to left shift Oncology consulted patient to started Decadron Neurosurgeon Dr. Glenford Peers consulted did not advise any further imaging Hyperglycemia aggressively treated with IV insulin initially and transitioned Status post lymph node biopsy consistent with metastatic melanoma.  Patient seen by radiation oncology/medical oncology status post 3T MRI.  Palliative care following.   On 2/22 /2022 patient and family decide to go home with hospice.  Palliative care following.    Assessment & Plan:   Principal Problem:   Metastatic melanoma ([place]) Active Problems:   Acute on chronic respiratory failure with hypoxia (HCC)   Chronic respiratory failure with hypoxia (HCC)   Brain metastases (HCC)   Hyperglycemia   Hyperkalemia   PAF (paroxysmal atrial fibrillation) (HCC)   HTN (hypertension)   Hypothyroidism   Protein calorie malnutrition (HCC)    Malnutrition of moderate degree   Metastatic malignant neoplasm ([place])   Palliative care by specialist   Goals of care, counseling/discussion   General weakness  1 metastatic malignant melanoma /right upper lobe lung mass with mediastinal and hilar adenopathy with probable adrenal mets, bone mets, brain metastases Patient presented with ataxia with difficulty standing.  Work-up done including head CT and MRI brain concerning for metastatic disease.  Concern for metastatic lung cancer.  Patient noted to be evaluated in the outpatient setting by pulmonary medicine and PET scan was performed May 21, 2020 with findings compatible with primary bronchogenic carcinoma with findings concerning for adrenal mets, bone mets.  Patient started on Decadron.  Patient seen in consultation by radiation oncology as well as medical oncology who recommended CT-guided biopsy of one of his lymph nodes per IR which was done on 06/04/2020 with pathology consistent with malignant melanoma.  Medical oncology, radiation oncology, neuro-oncology following and have discussed with patient and family on prognosis and palliative treatment with immunotherapy.  Patient thinking about recommendations to decide whether to proceed with treatment at this time.  Patient s/p 3T MRI scan (2/18) per radiation oncology and deemed to be a candidate for SRS. Per medical oncology/neuro-oncology/radiation oncology.   Patient and sister have decided to transition home with hospice.  Palliative care following currently symptoms well controlled.  Hospice liaison to meet with patient and family to discuss logistics for transfer for home with hospice.  Appreciate palliative care input and recommendations.    2.  Acute on chronic respiratory failure secondary to severe COPD on 4 L nasal cannula at baseline in the setting of metastatic probable lung  cancer/probable acute COPD exacerbation Patient currently on 4 L nasal cannula with sats around 94-100 %.   Worsening respiratory status noted on 06/06/2020, likely secondary to metastatic lung cancer/metastatic melanoma and acute COPD exacerbation.  Cervical biopsy consistent with malignant melanoma..  Patient states no significant change of chronic shortness of breath.  Patient was visibly short of breath however has improved and likely close to baseline.  No current use of accessory muscles of respiration.   ABG obtained with a pH of 7.39, PCO2 of 64, PO2 of 92, acid base of 10.2, bicarb of 38.  Decrease IV Solu-Medrol to 60 mg every 12 hours.  Continue to taper IV Solu-Medrol and transition to Decadron.  Continue Mucinex, Pulmicort nebs, scheduled duo nebs, BiPAP nightly.  Chest PT.  Supportive care.  Follow.   3.  Paroxysmal atrial fibrillation In NSR.  Continue Cardizem, Tambocor for rate control.  Not on anticoagulation.  Follow.   4.  Type 2 diabetes mellitus on insulin pump at home Patient noted to have pump not functioning well.  On presentation patient noted to be hypoglycemic requiring insulin drip and have subsequently been transitioned to subcutaneous Lantus, sliding scale insulin.  Patient with low CBGs yesterday evening and as such Lantus decreased to 15 units daily.  CBGs of 183 this morning and elevated to 351 by lunchtime.  Increase Lantus back to 25 units daily.  Start NovoLog meal coverage 6 units 3 times daily with meals to start tomorrow.  Sliding scale insulin.  Monitor CBGs with steroid taper.   5.  Hypothyroidism Synthroid.   6.  Probable colon cancer Patient noted with concerns for possible colon cancer.  Patient will be a poor candidate for further work-up at this time.  Patient decided on transitioning home with hospice.    7.  Prior history of malignant melanoma Per oncology.  8.  Hypertension Continue current regimen of Cardizem.  HCTZ discontinued.  Hold torsemide today and resume tomorrow.  9.  Constipation Continue current bowel regimen.   10.  Disposition Patient  has decided to focus on comfort and wanting to transition home with hospice support.  Hospice liaison to set up meeting with patient and sister to discuss and set up logistics for transitioning home with hospice care.     DVT prophylaxis: SCDs Code Status: DNR Family Communication: Updated patient and sister at bedside. Disposition:   Status is: Inpatient    Dispo: The patient is from: Home              Anticipated d/c is to: Home with hospice.               Anticipated d/c date is: 2/24/ 2022              Patient currently on 4 l nasal cannula, decided to transition home with hospice support.  Hospice support/hospice [place] being set up to discuss logistics to transition home with hospice care.    Difficult to place patient undetermined.       Consultants:   Palliative care: Dr. Rowe Pavy 06/05/2020  Interventional radiology: Dr. Annamaria Boots 06/04/2020  Radiation oncology: Worthy Flank, PA 06/03/2020  Medical oncology: Dr. Lorna Few 06/03/2020  Procedures:  CT Head 06/02/2020  Chest x-ray 06/02/2020  MRI brain 06/04/2020  Ultrasound-guided core biopsy of right cervical adenopathy per IR, Dr. Annamaria Boots 06/04/2020--malignant melanoma  MRI brain with and without contrast 06/07/2020  Antimicrobials:   None   Subjective: Patient sitting up in bed.  Stated nasal cannula came  off last night leading to events of cyanosis however denies any unresponsiveness.  Denies any significant shortness of breath from baseline today.  No chest pain.  Sister at bedside and stated he is decided on comfort measures and to go home with his sister with hospice.   Objective: Vitals:   06/11/20 0823 06/11/20 0850 06/11/20 1340 06/11/20 1645  BP:  136/76 131/62   Pulse:   80   Resp:   (!) 22   Temp:   (!) 97.5 F (36.4 C)   TempSrc:   Oral   SpO2: 100%  94% 96%  Weight:      Height:        Intake/Output Summary (Last 24 hours) at 06/11/2020 1802 Last data filed at 06/11/2020 1600 Gross  per 24 hour  Intake 960 ml  Output 1050 ml  Net -90 ml   Filed Weights   06/09/20 0500 06/10/20 0500 06/11/20 0500  Weight: 68.5 kg 65.7 kg 67.4 kg    Examination:  General exam: : NAD Respiratory system: Scattered coarse breath sounds.  Minimal expiratory wheezing.  No use of accessory muscles of respiration.  Fair air movement.   Cardiovascular system: Regular rate and rhythm no murmurs rubs or gallops.  No JVD.  No lower extremity edema.  Gastrointestinal system: Abdomen soft, nontender, nondistended, positive bowel sounds.  No rebound.  No guarding. Central nervous system: Alert and oriented. No focal neurological deficits. Extremities: Symmetric 5 x 5 power. Skin: No rashes, lesions or ulcers Psychiatry: Judgement and insight appear normal. Mood & affect appropriate.   Data Reviewed: I have personally reviewed following labs and imaging studies  CBC: Recent Labs  Lab 06/05/20 0250 06/06/20 0245 06/07/20 0233 06/10/20 0508 06/11/20 0525  WBC 14.0* 10.8* 8.8 12.6* 14.6*  NEUTROABS 13.0* 9.8* 8.3*  --   --   HGB 14.9 14.3 14.3 15.0 15.3  HCT 45.9 44.9 43.8 45.4 46.5  MCV 96.0 97.6 94.6 94.4 94.9  PLT 314 265 236 209 390    Basic Metabolic Panel: Recent Labs  Lab 06/06/20 0245 06/07/20 0233 06/08/20 0227 06/09/20 0545 06/10/20 0508 06/11/20 0525  NA 139 141 142 139 140 139  K 4.9 4.8 4.0 4.2 4.2 4.6  CL 94* 96* 93* 91* 90* 89*  CO2 37* 36* 37* 39* 39* 38*  GLUCOSE 356* 276* 205* 288* 194* 206*  BUN 42* 37* 40* 38* 41* 39*  CREATININE 0.97 0.68 0.79 0.80 0.65 0.92  CALCIUM 9.2 9.1 8.8* 8.4* 8.3* 8.3*  MG 2.1  --   --  2.2 2.4  --     GFR: Estimated Creatinine Clearance: 69.2 mL/min (by C-G formula based on SCr of 0.92 mg/dL).  Liver Function Tests: Recent Labs  Lab 06/05/20 0250 06/06/20 0245 06/07/20 0233  AST 21 21 20   ALT 17 18 23   ALKPHOS 108 113 106  BILITOT 0.8 0.8 0.9  PROT 6.4* 5.9* 5.5*  ALBUMIN 3.6 3.3* 3.2*    CBG: Recent Labs   Lab 06/10/20 1746 06/10/20 2056 06/11/20 0814 06/11/20 1137 06/11/20 1643  GLUCAP 102* 87 183* 351* 297*     Recent Results (from the past 240 hour(s))  Resp Panel by RT-PCR (Flu A&B, Covid) Nasopharyngeal Swab     Status: None   Collection Time: 06/02/20 11:22 PM   Specimen: Nasopharyngeal Swab; Nasopharyngeal(NP) swabs in vial transport medium  Result Value Ref Range Status   SARS Coronavirus 2 by RT PCR NEGATIVE NEGATIVE Final    Comment: (NOTE) SARS-CoV-2 target  nucleic acids are NOT DETECTED.  The SARS-CoV-2 RNA is generally detectable in upper respiratory specimens during the acute phase of infection. The lowest concentration of SARS-CoV-2 viral copies this assay can detect is 138 copies/mL. A negative result does not preclude SARS-Cov-2 infection and should not be used as the sole basis for treatment or other patient management decisions. A negative result may occur with  improper specimen collection/handling, submission of specimen other than nasopharyngeal swab, presence of viral mutation(s) within the areas targeted by this assay, and inadequate number of viral copies(<138 copies/mL). A negative result must be combined with clinical observations, patient history, and epidemiological information. The expected result is Negative.  Fact Sheet for Patients:  EntrepreneurPulse.com.au  Fact Sheet for Healthcare Providers:  IncredibleEmployment.be  This test is no t yet approved or cleared by the [place] FDA and  has been authorized for detection and/or diagnosis of SARS-CoV-2 by FDA under an Emergency Use Authorization (EUA). This EUA will remain  in effect (meaning this test can be used) for the duration of the COVID-19 declaration under Section 564(b)(1) of the Act, 21 U.S.C.section 360bbb-3(b)(1), unless the authorization is terminated  or revoked sooner.       Influenza A by PCR NEGATIVE NEGATIVE Final   Influenza B  by PCR NEGATIVE NEGATIVE Final    Comment: (NOTE) The Xpert Xpress SARS-CoV-2/FLU/RSV plus assay is intended as an aid in the diagnosis of influenza from Nasopharyngeal swab specimens and should not be used as a sole basis for treatment. Nasal washings and aspirates are unacceptable for Xpert Xpress SARS-CoV-2/FLU/RSV testing.  Fact Sheet for Patients: EntrepreneurPulse.com.au  Fact Sheet for Healthcare Providers: IncredibleEmployment.be  This test is not yet approved or cleared by the [place] FDA and has been authorized for detection and/or diagnosis of SARS-CoV-2 by FDA under an Emergency Use Authorization (EUA). This EUA will remain in effect (meaning this test can be used) for the duration of the COVID-19 declaration under Section 564(b)(1) of the Act, 21 U.S.C. section 360bbb-3(b)(1), unless the authorization is terminated or revoked.  Performed at [facility], [place] [address], [place], [place] [address]   MRSA PCR Screening     Status: None   Collection Time: 06/03/20  8:41 AM   Specimen: Nasal Mucosa; Nasopharyngeal  Result Value Ref Range Status   MRSA by PCR NEGATIVE NEGATIVE Final    Comment:        The GeneXpert MRSA Assay (FDA approved for NASAL specimens only), is one component of a comprehensive MRSA colonization surveillance program. It is not intended to diagnose MRSA infection nor to guide or monitor treatment for MRSA infections. Performed at [facility], [place] [address], [place], [place] [address]          Radiology Studies: No results found.      Scheduled Meds: . (feeding supplement) PROSource Plus  30 mL Oral Daily  . allopurinol  300 mg Oral Daily  . budesonide (PULMICORT) nebulizer solution  0.5 mg Nebulization BID  . chlorhexidine  15 mL Mouth Rinse BID  . diltiazem  120 mg Oral Daily  . feeding supplement (GLUCERNA SHAKE)  237 mL Oral BID BM  .  flecainide  200 mg Oral BID  . guaiFENesin  1,200 mg Oral BID  . hydroxychloroquine  200 mg Oral Daily  . insulin aspart  0-20 Units Subcutaneous TID WC  . [START ON 06/12/2020] insulin aspart  6 Units Subcutaneous TID WC  . insulin glargine  10 Units  Subcutaneous Once  . [START ON 06/12/2020] insulin glargine  25 Units Subcutaneous Daily  . ipratropium-albuterol  3 mL Nebulization Q4H  . levothyroxine  88 mcg Oral Q0600  . loratadine  10 mg Oral Daily  . mouth rinse  15 mL Mouth Rinse q12n4p  . methylPREDNISolone (SOLU-MEDROL) injection  60 mg Intravenous Q12H  . pantoprazole  40 mg Oral Daily  . polyethylene glycol  17 g Oral BID  . psyllium  1 packet Oral Daily  . senna-docusate  1 tablet Oral BID  . [START ON 06/12/2020] torsemide  10 mg Oral Daily   Continuous Infusions:   LOS: 9 days    Time spent: 40 minutes    Irine Seal, MD [facility]   To contact the attending provider between 7A-7P or the covering provider during after hours 7P-7A, please log into the web site [email or web address] and access using universal [place] password for that web site. If you do not have the password, please call the hospital operator.  06/11/2020, 6:02 PM

## 2020-06-11 NOTE — Progress Notes (Signed)
Manufacturing engineer [facility]) Hospital Liaison: RN note     Writer spoke with patient at bedside to initiate education related to hospice philosophy, services and team approach to care. Mr. Casebeer verbalized understanding of information given. Patient plans to discuss further with his sister but states he thinks hospice is going to be the choice.  [place] liaison will follow up with patient and family tomorrow.    A Please do not hesitate to call with questions.     Thank you,     Farrel Gordon, RN, [place] (listed on [facility] under [place])      [phone]

## 2020-06-11 NOTE — Progress Notes (Signed)
Palliative care progress note  Reason for consult: Goals of care in light of metastatic disease  Chart reviewed.  I received call from bedside RN that patient's sister was hoping to talk about plan for Arthur [name] to transition home with hospice support.  I called and was able to reach his sister, Arthur [name].  We discussed his clinical course this admission and his advanced disease with increasing symptom burden.  He met with hospice liaison this morning and they discussed likely plan for transition home with hospice.  Arthur [name] reports they have now discussed further and he would like to work to get home with the support of hospice.  Arthur [name] will be primary caregiver and has many questions about the logistics of transition home with hospice and the exact services that hospice will provide.  She reports understanding she will need more help with nursing type services than she can provide alone and needs to work to arrange these services.  She tells me that he has multiple family members and friends who will want to come and see him and may be able to help provide care for him as well.  Overall, plan is to work to transition home with the support of hospice once arrangements can be made.  We also discussed symptom management.  He had a "scary episode" last evening where oxygen came off and he became blue.  His sister reports today, however, he has been having a pretty good day.  Symptoms have been well controlled and has been able to eat some and is currently on the phone with a friend.  I called and spoke with hospice liaison.  Reviewed sister's desire for face-to-face meeting to discuss logistics of hospice care at home and help her determine what needs will be so she can prepare for his transition home with hospice services.  [facility] collective call to set up an appointment with her (likely through face-to-face meeting tomorrow).  -DNR/DNI -Overall goal is to transition home with hospice support.   He would like to work with Manufacturing engineer.  Hospice liaison will work to set up meeting with patient and his sister to discuss logistics of transition home with hospice care. -Symptoms well controlled at this time.  Continue current regimen. - Please call with other palliative specific needs.  Start: 1350 End: 1415 Total time: 25 minutes Greater than 50%  of this time was spent counseling and coordinating care related to the above assessment and plan.   Arthur Rough, MD [place] Team [phone]

## 2020-06-11 NOTE — Progress Notes (Signed)
I called and spoke with the patient's sister and he had an eventful evening with hypoxia and difficulty with breathing. He has decided to forgo radiotherapy and elect for hospice based care. We will plan to forgo any treatments and cancel plans for radiation. We will be available as needed moving forward.    Carola Rhine, PAC

## 2020-06-12 DIAGNOSIS — C799 Secondary malignant neoplasm of unspecified site: Secondary | ICD-10-CM | POA: Diagnosis not present

## 2020-06-12 DIAGNOSIS — F419 Anxiety disorder, unspecified: Secondary | ICD-10-CM | POA: Diagnosis not present

## 2020-06-12 DIAGNOSIS — J9611 Chronic respiratory failure with hypoxia: Secondary | ICD-10-CM | POA: Diagnosis not present

## 2020-06-12 DIAGNOSIS — C7931 Secondary malignant neoplasm of brain: Secondary | ICD-10-CM | POA: Diagnosis not present

## 2020-06-12 DIAGNOSIS — J9621 Acute and chronic respiratory failure with hypoxia: Secondary | ICD-10-CM | POA: Diagnosis not present

## 2020-06-12 DIAGNOSIS — Z515 Encounter for palliative care: Secondary | ICD-10-CM | POA: Diagnosis not present

## 2020-06-12 DIAGNOSIS — E440 Moderate protein-calorie malnutrition: Secondary | ICD-10-CM

## 2020-06-12 LAB — GLUCOSE, CAPILLARY
Glucose-Capillary: 247 mg/dL — ABNORMAL HIGH (ref 70–99)
Glucose-Capillary: 199 mg/dL — ABNORMAL HIGH (ref 70–99)
Glucose-Capillary: 186 mg/dL — ABNORMAL HIGH (ref 70–99)
Glucose-Capillary: 186 mg/dL — ABNORMAL HIGH (ref 70–99)
Glucose-Capillary: 94 mg/dL (ref 70–99)
Glucose-Capillary: 73 mg/dL (ref 70–99)

## 2020-06-12 MED ORDER — ALPRAZOLAM 0.5 MG PO TABS
0.5000 mg | ORAL_TABLET | Freq: Two times a day (BID) | ORAL | Status: DC | PRN
  Administered 2020-06-12: 0.5 mg via ORAL
  Filled 2020-06-12: qty 1

## 2020-06-12 MED ORDER — ALPRAZOLAM 0.5 MG PO TABS
0.5000 mg | ORAL_TABLET | Freq: Four times a day (QID) | ORAL | Status: DC | PRN
  Administered 2020-06-12 – 2020-06-13 (×2): 0.5 mg via ORAL
  Filled 2020-06-12 (×2): qty 1

## 2020-06-12 MED ORDER — LORAZEPAM 0.5 MG PO TABS
0.5000 mg | ORAL_TABLET | Freq: Four times a day (QID) | ORAL | Status: DC | PRN
  Administered 2020-06-12: 0.5 mg via ORAL
  Filled 2020-06-12: qty 1

## 2020-06-12 MED ORDER — ALBUTEROL SULFATE (2.5 MG/3ML) 0.083% IN NEBU
2.5000 mg | INHALATION_SOLUTION | RESPIRATORY_TRACT | Status: DC | PRN
  Administered 2020-06-12: 2.5 mg via RESPIRATORY_TRACT
  Filled 2020-06-12: qty 3

## 2020-06-12 MED ORDER — IPRATROPIUM-ALBUTEROL 0.5-2.5 (3) MG/3ML IN SOLN
3.0000 mL | RESPIRATORY_TRACT | Status: DC
  Administered 2020-06-12 – 2020-06-13 (×9): 3 mL via RESPIRATORY_TRACT
  Filled 2020-06-12 (×9): qty 3

## 2020-06-12 NOTE — Plan of Care (Signed)
  Problem: Health Behavior/Discharge Planning: Goal: Ability to manage health-related needs will improve Outcome: Progressing   Problem: Clinical Measurements: Goal: Respiratory complications will improve Outcome: Progressing   Problem: Nutrition: Goal: Adequate nutrition will be maintained Outcome: Progressing

## 2020-06-12 NOTE — Progress Notes (Signed)
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 Ander Slade, as well as other comorbidities who was found to have a right upper lobe mass after his office visit in January.  He is not a very good candidate for bronchoscopy given his severe emphysema and risk of pneumothorax.  PET scan was done and showed metastatic disease to the left adrenal and bone in addition to other nodules.  He presented to the emergency room 01/30/2021 after a fall causing his insulin pump to follow-up and his blood sugars were extremely high in the 500 range.  He had significant weakness and had inability stand being down on the ground.  He had a head CT done which showed a solid mass in the inferior frontal lobe on the right side with edema necrotic masses in the right hemisphere with surrounding edema with a 2 mm right to left shift.  Oncology was consulted and he was started on Decadron.  Neurosurgery Dr. Glenford Peers was consulted and did not advise any further imaging.  Hyperglycemia was aggressively treated with IV insulin initially and then transitioned to long-acting.  He is status post lymph node biopsy consistent with metastatic melanoma.  Patient has been seen by radiation oncology as well as medical oncology and is status post 3T MRI.  Palliative care has been following.  After subsequent 5 care discussions on 05/22/2020 patient and the family decided to go home with hospice and palliative care is currently following and appreciate their assistance with this patient.   Assessment & Plan:    Principal Problem:   Metastatic melanoma ([place]) Active Problems:   Chronic respiratory failure with hypoxia (HCC)   Brain metastases (HCC)   Hyperglycemia   Hyperkalemia   PAF (paroxysmal atrial fibrillation) (HCC)   HTN (hypertension)   Hypothyroidism   Protein calorie malnutrition (HCC)   Malnutrition of moderate degree   Acute on chronic respiratory failure with hypoxia (HCC)   Metastatic malignant neoplasm ([place])   Palliative care by specialist   Goals of care, counseling/discussion   General weakness  Metastatic malignant melanoma /right upper lobe lung mass with mediastinal and hilar adenopathy with probable adrenal mets, bone mets, brain metastases -Patient presented with ataxia with difficulty standing.   -Work-up done including head CT and MRI brain concerning for metastatic disease.  Concern for metastatic lung cancer.   -Patient noted to be evaluated in the outpatient setting by pulmonary medicine and PET scan was performed May 21, 2020 with findings compatible with primary bronchogenic carcinoma with findings concerning for adrenal mets, bone mets.   -Patient started on Decadron as an outpatient.  Patient seen in consultation by radiation oncology as well as medical oncology who recommended  -CT-guided biopsy of one of his lymph nodes per IR which was done on 06/04/2020 with pathology consistent with malignant melanoma.   -Medical oncology, radiation oncology, neuro-oncology following and have discussed with patient and family on prognosis and palliative treatment with immunotherapy.   -Patient thinking about recommendations to decide whether to proceed with treatment at this time.  Patient s/p 3T MRI scan (2/18) per radiation oncology and deemed  to be a candidate for SRS. Per medical oncology/neuro-oncology/radiation oncology.   -Patient and sister have decided to transition home with hospice.   -Palliative care following currently symptoms well controlled.  Hospice liaison  to meet with patient and family to discuss logistics for transfer for home with hospice discharge and anticipating after equipment is delivered home. -Appreciate palliative care input and recommendations.    Acute on chronic respiratory failure secondary to severe COPD on 4 L nasal cannula at baseline in the setting of metastatic probable lung cancer/probable acute COPD exacerbation -Patient currently on 4 L nasal cannula with sats around 94-100 %.   -He had Worsening respiratory status noted on 06/06/2020, likely secondary to metastatic lung cancer/metastatic melanoma and acute COPD exacerbation.   -Cervical biopsy consistent with malignant melanoma..  Patient states no significant change of chronic shortness of breath.   -Patient was visibly short of breath however has improved and likely close to baseline.  No current use of accessory muscles of respiration.  -SpO2: 93 % O2 Flow Rate (L/min): 4 L/min FiO2 (%): 40 % - ABG    Component Value Date/Time   PHART 7.392 06/06/2020 1217   PCO2ART 63.6 (H) 06/06/2020 1217   PO2ART 91.7 06/06/2020 1217   HCO3 37.8 (H) 06/06/2020 1217   TCO2 32 06/02/2020 2233   TCO2 32 06/02/2020 2233   ACIDBASEDEF 0.6 06/02/2020 2238   O2SAT 96.3 06/06/2020 1217   -Decrease IV Solu-Medrol to 60 mg every 12 hours and will change to 60 mg Daily in the AM and then Transition to po Decadron at D/C.   -Continue to taper IV Solu-Medrol and transition to Decadron.   -Continue Mucinex, Pulmicort nebs, scheduled duo nebs, BiPAP nightly.  Chest PT.  Supportive care and will transition to Home Hospice -Has Morphine Concentrate 5 mg po q4hprn Severe Pain, Dyspnea, Air Hunger  Paroxysmal Atrial Fibrillation -In NSR.   -Continue Cardizem, Tambocor for rate control -Not on anticoagulation.  Follow.   Type 2 Diabetes Mellitus on insulin pump at home -Patient noted to have pump not functioning well.   -On presentation patient noted to be hypoglycemic requiring insulin  drip and have subsequently been transitioned to subcutaneous Lantus, sliding scale insulin.   -Patient with low CBGs yesterday evening and as such Lantus decreased to 15 units daily.  CBGs of 183 this morning and elevated to 351 by lunchtime.   -Increase Lantus back to 25 units daily.  Start NovoLog meal coverage 6 units 3 times daily with meals to start tomorrow.  Sliding scale insulin.  Monitor CBGs with steroid taper  -CBGs ranging form 183-297  Hypothyroidism -C/w Levothyroxine 88 mcg po Daily .   Probable Colon Cancer -Patient noted with concerns for possible colon cancer.   -Patient will be a poor candidate for further work-up at this time.   -Patient decided on transitioning home with hospice.    Prior history of Malignant Melanoma -As above. Further care per Oncology   Hypertension -Continue current regimen of Cardizem.  HCTZ discontinued.  Will resume Torsemide today  Constipation Continue current bowel regime with Miralax 17 g po BID, Psyllium 1 packet Daily and Senna-Docusate 1 tab po BID.   Non-Severe (Moderate) Malnutrition in the Context of Chronic Illness -C/w Glucerna Shake BID and 30 mL Prosource Plus once/day  Anxiety -Resumed Alprazolam 0.5 mg po BID  Disposition -Patient has decided to focus on comfort and wanting to transition home with hospice support.  Hospice liaison to set up meeting with patient  and sister to discuss and set up logistics for transitioning home with hospice care.  -Equipment to be delivered so D/C will not happen until tomorrow.    DVT prophylaxis: SCDs Code Status: DO NOT RESUSCITATE  Family Communication: No family present at bedside  Disposition Plan: (specify when and where you expect patient to be discharged). Include barriers to DC in this tab.  Status is: Inpatient  Remains inpatient appropriate because:Unsafe d/c plan, IV treatments appropriate due to intensity of illness or inability to take PO and Inpatient level of  care appropriate due to severity of illness   Dispo: The patient is from: Home              Anticipated d/c is to: Home with Hospice              Anticipated d/c date is: 1 day              Patient currently is not medically stable to d/c.   Difficult to place patient No  Consultants:   Palliative Care Medicine    Procedures: None  Antimicrobials:  Anti-infectives (From admission, onward)   Start     Dose/Rate Route Frequency Ordered Stop   06/03/20 1000  hydroxychloroquine (PLAQUENIL) tablet 200 mg        200 mg Oral Daily 06/03/20 0120          Subjective: Seen and examined at bedside and was wanting to go home.  Was very anxious this morning and states that he takes alprazolam which has not been resumed so I resumed it.  He denies chest pain, lightheadedness or dizziness but wants to go home.  No other concerns or complaints at this time.  Objective: Vitals:   06/12/20 0549 06/12/20 0743 06/12/20 1311 06/12/20 1335  BP: 138/70  132/67   Pulse: 84  84   Resp: 16  19   Temp: 97.7 F (36.5 C)  (!) 97.5 F (36.4 C)   TempSrc: Oral  Oral   SpO2: 93% 93% 96% 93%  Weight:      Height:        Intake/Output Summary (Last 24 hours) at 06/12/2020 1346 Last data filed at 06/12/2020 1000 Gross per 24 hour  Intake 720 ml  Output 850 ml  Net -130 ml   Filed Weights   06/10/20 0500 06/11/20 0500 06/12/20 0500  Weight: 65.7 kg 67.4 kg 70.4 kg   Examination: Physical Exam:  Constitutional: WN/WD slightly disheveled chronically ill-appearing Caucasian male currently in no acute distress appears anxious Eyes: Lids and conjunctivae normal, sclerae anicteric  ENMT: External Ears, Nose appear normal. Grossly normal hearing. Neck: Appears normal, supple, no cervical masses, normal ROM, no appreciable thyromegaly; no JVD Respiratory: Diminished to auscultation bilaterally with coarse breath sounds, no wheezing, rales, rhonchi or crackles. Normal respiratory effort and patient is  not tachypenic. No accessory muscle use.  Unlabored breathing wearing supplemental oxygen via nasal cannula Cardiovascular: RRR, no murmurs / rubs / gallops. S1 and S2 auscultated.  Has 1+ lower extremity edema Abdomen: Soft, non-tender, mildly distended secondary body habitus. Bowel sounds positive.  GU: Deferred. Musculoskeletal: No clubbing / cyanosis of digits/nails. No joint deformity upper and lower extremities.  Skin: No rashes, lesions, ulcers on limited skin evaluation. No induration; Warm and dry.  Neurologic: CN 2-12 grossly intact with no focal deficits. Romberg sign and cerebellar reflexes not assessed.  Psychiatric: Normal judgment and insight. Alert and oriented x 3. Anxious mood and appropriate affect.  Data Reviewed: I have personally reviewed following labs and imaging studies  CBC: Recent Labs  Lab 06/06/20 0245 06/07/20 0233 06/10/20 0508 06/11/20 0525  WBC 10.8* 8.8 12.6* 14.6*  NEUTROABS 9.8* 8.3*  --   --   HGB 14.3 14.3 15.0 15.3  HCT 44.9 43.8 45.4 46.5  MCV 97.6 94.6 94.4 94.9  PLT 265 236 209 774   Basic Metabolic Panel: Recent Labs  Lab 06/06/20 0245 06/07/20 0233 06/08/20 0227 06/09/20 0545 06/10/20 0508 06/11/20 0525  NA 139 141 142 139 140 139  K 4.9 4.8 4.0 4.2 4.2 4.6  CL 94* 96* 93* 91* 90* 89*  CO2 37* 36* 37* 39* 39* 38*  GLUCOSE 356* 276* 205* 288* 194* 206*  BUN 42* 37* 40* 38* 41* 39*  CREATININE 0.97 0.68 0.79 0.80 0.65 0.92  CALCIUM 9.2 9.1 8.8* 8.4* 8.3* 8.3*  MG 2.1  --   --  2.2 2.4  --    GFR: Estimated Creatinine Clearance: 72.3 mL/min (by C-G formula based on SCr of 0.92 mg/dL). Liver Function Tests: Recent Labs  Lab 06/06/20 0245 06/07/20 0233  AST 21 20  ALT 18 23  ALKPHOS 113 106  BILITOT 0.8 0.9  PROT 5.9* 5.5*  ALBUMIN 3.3* 3.2*   No results for input(s): LIPASE, AMYLASE in the last 168 hours. No results for input(s): AMMONIA in the last 168 hours. Coagulation Profile: No results for input(s): INR,  PROTIME in the last 168 hours. Cardiac Enzymes: No results for input(s): CKTOTAL, CKMB, CKMBINDEX, TROPONINI in the last 168 hours. BNP (last 3 results) No results for input(s): PROBNP in the last 8760 hours. HbA1C: No results for input(s): HGBA1C in the last 72 hours. CBG: Recent Labs  Lab 06/11/20 2258 06/12/20 0200 06/12/20 0418 06/12/20 0731 06/12/20 1128  GLUCAP 292* 247* 199* 186* 186*   Lipid Profile: No results for input(s): CHOL, HDL, LDLCALC, TRIG, CHOLHDL, LDLDIRECT in the last 72 hours. Thyroid Function Tests: No results for input(s): TSH, T4TOTAL, FREET4, T3FREE, THYROIDAB in the last 72 hours. Anemia Panel: No results for input(s): VITAMINB12, FOLATE, FERRITIN, TIBC, IRON, RETICCTPCT in the last 72 hours. Sepsis Labs: No results for input(s): PROCALCITON, LATICACIDVEN in the last 168 hours.  Recent Results (from the past 240 hour(s))  Resp Panel by RT-PCR (Flu A&B, Covid) Nasopharyngeal Swab     Status: None   Collection Time: 06/02/20 11:22 PM   Specimen: Nasopharyngeal Swab; Nasopharyngeal(NP) swabs in vial transport medium  Result Value Ref Range Status   SARS Coronavirus 2 by RT PCR NEGATIVE NEGATIVE Final    Comment: (NOTE) SARS-CoV-2 target nucleic acids are NOT DETECTED.  The SARS-CoV-2 RNA is generally detectable in upper respiratory specimens during the acute phase of infection. The lowest concentration of SARS-CoV-2 viral copies this assay can detect is 138 copies/mL. A negative result does not preclude SARS-Cov-2 infection and should not be used as the sole basis for treatment or other patient management decisions. A negative result may occur with  improper specimen collection/handling, submission of specimen other than nasopharyngeal swab, presence of viral mutation(s) within the areas targeted by this assay, and inadequate number of viral copies(<138 copies/mL). A negative result must be combined with clinical observations, patient history, and  epidemiological information. The expected result is Negative.  Fact Sheet for Patients:  EntrepreneurPulse.com.au  Fact Sheet for Healthcare Providers:  IncredibleEmployment.be  This test is no t yet approved or cleared by the [place] FDA and  has been authorized for detection and/or  diagnosis of SARS-CoV-2 by FDA under an Emergency Use Authorization (EUA). This EUA will remain  in effect (meaning this test can be used) for the duration of the COVID-19 declaration under Section 564(b)(1) of the Act, 21 U.S.C.section 360bbb-3(b)(1), unless the authorization is terminated  or revoked sooner.       Influenza A by PCR NEGATIVE NEGATIVE Final   Influenza B by PCR NEGATIVE NEGATIVE Final    Comment: (NOTE) The Xpert Xpress SARS-CoV-2/FLU/RSV plus assay is intended as an aid in the diagnosis of influenza from Nasopharyngeal swab specimens and should not be used as a sole basis for treatment. Nasal washings and aspirates are unacceptable for Xpert Xpress SARS-CoV-2/FLU/RSV testing.  Fact Sheet for Patients: EntrepreneurPulse.com.au  Fact Sheet for Healthcare Providers: IncredibleEmployment.be  This test is not yet approved or cleared by the [place] FDA and has been authorized for detection and/or diagnosis of SARS-CoV-2 by FDA under an Emergency Use Authorization (EUA). This EUA will remain in effect (meaning this test can be used) for the duration of the COVID-19 declaration under Section 564(b)(1) of the Act, 21 U.S.C. section 360bbb-3(b)(1), unless the authorization is terminated or revoked.  Performed at [facility], [place] [address] [place], [place] [address]   MRSA PCR Screening     Status: None   Collection Time: 06/03/20  8:41 AM   Specimen: Nasal Mucosa; Nasopharyngeal  Result Value Ref Range Status   MRSA by PCR NEGATIVE NEGATIVE Final    Comment:        The  GeneXpert MRSA Assay (FDA approved for NASAL specimens only), is one component of a comprehensive MRSA colonization surveillance program. It is not intended to diagnose MRSA infection nor to guide or monitor treatment for MRSA infections. Performed at [facility], [place] [address] [place], [place] [address]     RN Pressure Injury Documentation:     Estimated body mass index is 22.27 kg/m as calculated from the following:   Height as of this encounter: 5\' 10"  (1.778 m).   Weight as of this encounter: 70.4 kg.  Malnutrition Type:  Nutrition Problem: Moderate Malnutrition Etiology: cancer and cancer related treatments,chronic illness  Malnutrition Characteristics:  Signs/Symptoms: mild fat depletion,mild muscle depletion,moderate fat depletion,moderate muscle depletion  Nutrition Interventions:  Interventions: Prostat,Glucerna shake    Radiology Studies: No results found.  Scheduled Meds: . (feeding supplement) PROSource Plus  30 mL Oral Daily  . allopurinol  300 mg Oral Daily  . budesonide (PULMICORT) nebulizer solution  0.5 mg Nebulization BID  . chlorhexidine  15 mL Mouth Rinse BID  . diltiazem  120 mg Oral Daily  . feeding supplement (GLUCERNA SHAKE)  237 mL Oral BID BM  . flecainide  200 mg Oral BID  . guaiFENesin  1,200 mg Oral BID  . hydroxychloroquine  200 mg Oral Daily  . insulin aspart  0-20 Units Subcutaneous TID WC  . insulin aspart  6 Units Subcutaneous TID WC  . insulin glargine  25 Units Subcutaneous Daily  . ipratropium-albuterol  3 mL Nebulization Q4H  . levothyroxine  88 mcg Oral Q0600  . loratadine  10 mg Oral Daily  . mouth rinse  15 mL Mouth Rinse q12n4p  . methylPREDNISolone (SOLU-MEDROL) injection  60 mg Intravenous Q12H  . pantoprazole  40 mg Oral Daily  . polyethylene glycol  17 g Oral BID  . psyllium  1 packet Oral Daily  . senna-docusate  1 tablet Oral BID  . torsemide  10 mg Oral  Daily   Continuous Infusions:    LOS: 10 days   Kerney Elbe, DO [facility] PAGER is on [place]  If 7PM-7AM, please contact night-coverage [email or web address]

## 2020-06-12 NOTE — Plan of Care (Signed)
   Problem: Pain Managment: Goal: General experience of comfort will improve Outcome: Progressing   Problem: Nutrition: Goal: Adequate nutrition will be maintained Outcome: Progressing   Problem: Safety: Goal: Ability to remain free from injury will improve Outcome: Progressing

## 2020-06-12 NOTE — Progress Notes (Addendum)
Notified by Transition of Care Manger of patient/family request for [facility] services at home after discharge. Chart and patient information under review by [facility] physician. Hospice eligibility approved.  This Liaison met with sister Velta Addison to initiate education related to hospice philosophy, services and team approach to care. [name]'s questions and concerns were answered.  She expressed concern for private aid to come out at night to help care for her brother. She also would need someone to show her how to administer subQ insulin injections prior to discharge.  Per discussion, sister Velta Addison was hoping for a Friday discharge, but I told her this was unlikely seeing stable, per MD plan is for discharge home by PTAR on 06/13/20 with a later discharge time to address [address] [place], [place] [address].   Please send signed and completed DNR form home with patient/family. Patient will need prescriptions for discharge comfort medications.     DME needs have been discussed, patient currently has the following equipment in the home: none.  Patient/family requests the following DME for delivery to the home: hospital bed, OBT, O2 4-6L. DME will be ordred with a delivery of 06/13/20 afternoon.  Home address has been verified and is different from within epic. [address] [place], [place] [address]. Velta Addison is the family member to contact to arrange time of delivery.     [facility] aware of the above. Please notify [facility] when patient is ready to leave the unit at discharge. (Call [phone] or [phone] after 5pm.) [facility] information and contact numbers given to [facility].      Please call with any hospice related questions.     Thank you for this referral.     Clementeen Hoof, BSN, [facility] (listed on AMION under Hospice and [place] [facility] [place])  [phone]

## 2020-06-12 NOTE — TOC Initial Note (Addendum)
Transition of Care [facility]) - Initial/Assessment Note    Patient Details  Name: Arthur [name] MRN: [id number] Date of Birth: 1945/04/22  Transition of Care [facility]) CM/SW Contact:    Dessa Phi, RN Phone Number: 06/12/2020, 11:42 AM  Clinical Narrative: Arthur [name] to Arthur [name] sister-primary caregiver for patient-discussed d/c plans-home with hospice-[facility] care rep Arthur [name] to meet w/Arthur [name] today-await final d/c home w/hospice services & dme-noted address for d/c:[address] [place]-PTAR to be called once all dme is in the home prior d/c.DNR.                2p-MD updated confirmed [facility] care for home w/hospice, dme to be set up in home prior d/c-likely tomorrow.PTAR for transport. 2:27p-Spoke to sister Arthur [name]-informed again on private duty services-the hospital can only provide how to access by website-private duty care agencies,yellow pages,or calling-since these are custodial level care services that are not accredited-we can only give the access to search independently-Arthur [name] voiced understanding. Nsg to instruct & return demo sister Arthur [name] on insulin sq injections prior d/c.  Expected Discharge Plan: Home w Hospice Care Barriers to Discharge: No Barriers Identified   Patient Goals and CMS Choice Patient states their goals for this hospitalization and ongoing recovery are:: go home with hospice CMS Medicare.gov Compare Post Acute Care list provided to:: Patient Represenative (must comment) Arthur [name] sister [phone])    Expected Discharge Plan and Services Expected Discharge Plan: [place]   Discharge Planning Services: CM Consult Post Acute Care Choice: Hospice Living arrangements for the past 2 months: Single Family Home                           HH Arranged: RN Arthur [name] Hospital Agency: H[facility]ice and [place] Date [place]: 06/12/20 Time HH Agency Contacted: 6 Representative spoke with at [place]: [place]  Arrangements/Services Living arrangements for the past 2 months: [place] with:: Self Patient language and need for interpreter reviewed:: Yes Do you feel safe going back to the place where you live?: Yes      Need for Family Participation in Patient Care: No (Comment) Care giver support system in place?: Yes (comment)   Criminal Activity/Legal Involvement Pertinent to Current Situation/Hospitalization: No - Comment as needed  Activities of Daily Living Home Assistive Devices/Equipment: BIPAP,Eyeglasses,Oxygen,Insulin Pump,CBG Meter ADL Screening (condition at time of admission) Patient's cognitive ability adequate to safely complete daily activities?: Yes Is the patient deaf or have difficulty hearing?: No Does the patient have difficulty seeing, even when wearing glasses/contacts?: No Does the patient have difficulty concentrating, remembering, or making decisions?: No Patient able to express need for assistance with ADLs?: Yes Does the patient have difficulty dressing or bathing?: No Independently performs ADLs?: Yes (appropriate for developmental age) Does the patient have difficulty walking or climbing stairs?: Yes (secondary to weakness) Weakness of Legs: Both Weakness of Arms/Hands: None  Permission Sought/Granted Permission sought to share information with : Arthur [name] Permission granted to share information with : Yes, Verbal Permission Granted  Share Information with NAME: Arthur [name]     Permission granted to share info w Relationship: Arthur [name] sister [phone]     Emotional Assessment Appearance:: Appears stated age Attitude/Demeanor/Rapport: Gracious Affect (typically observed): Accepting Orientation: : Oriented to Self,Oriented to Place,Oriented to Situation Alcohol / Substance Use: Not Applicable Psych Involvement: No (comment)  Admission diagnosis:  Brain tumor [facility]) [D49.6] Brain metastases ([place]) [  C79.31] Hyperglycemia  [R73.9] Metastatic malignant neoplasm, unspecified site [facility]) [C79.9] Patient Active Problem List   Diagnosis Date Noted  . Metastatic melanoma ([place]) 06/07/2020  . Palliative care by specialist   . Goals of care, counseling/discussion   . General weakness   . Acute on chronic respiratory failure with hypoxia ([place]) 06/05/2020  . Metastatic malignant neoplasm ([place])   . Malnutrition of moderate degree 06/04/2020  . Hyperglycemia 06/03/2020  . Hyperkalemia 06/03/2020  . PAF (paroxysmal atrial fibrillation) ([place]) 06/03/2020  . HTN (hypertension) 06/03/2020  . Hypothyroidism 06/03/2020  . Protein calorie malnutrition ([place]) 06/03/2020  . Brain metastases ([place]) 06/02/2020  . Hemoptysis 05/07/2020  . OSA treated with BiPAP 05/07/2020  . Chronic respiratory failure with hypoxia ([place]) 11/15/2018  . COPD GOLD IV  11/14/2018   PCP:  Bernerd Limbo, MD Pharmacy:   [id number] [place], [place] [address] [place] [address] Phone: [phone] Fax: [phone]     Social Determinants of Health (SDOH) Interventions    Readmission Risk Interventions Readmission Risk Prevention Plan 06/10/2020  Transportation Screening Complete  PCP or Specialist Appt within 3-5 Days Complete  HRI or [place] Complete  Social Work Consult for [place] Planning/Counseling Complete  Palliative Care Screening Complete  Medication Review Press photographer) Complete  Some recent data might be hidden

## 2020-06-12 NOTE — Progress Notes (Signed)
Physical Therapy Discharge Patient Details Name: Arthur [name] MRN: [id number] DOB: 1945-07-25 Today's Date: 06/12/2020 Time:  -     Patient discharged from PT services secondary to medical decline - will need to re-order PT to resume therapy services.Plans for home with Hospice.  Please see latest therapy progress note for current level of functioning and progress toward goals.      GP     Claretha Cooper 06/12/2020, 11:20 AM [place] Pager [phone] Office [phone]

## 2020-06-12 NOTE — Progress Notes (Signed)
OT Cancellation Note  Patient Details Name: Arthur [name] MRN: [id number] DOB: 11/15/1945   Cancelled Treatment:    Reason Eval/Treat Not Completed: Other (comment). Patient's plan is to go home with Hospice as soon as possible. Patient agreeable for therapy sevices to sign off. Re-consult if GOC chare.  [name] 06/12/2020, 10:47 AM

## 2020-06-12 NOTE — Progress Notes (Signed)
Daily Progress Note   Patient Name: Arthur [name]       Date: 06/12/2020 DOB: [birth year]  Age: 75 y.o. MRN#: [id number] Attending Physician: Kerney Elbe, DO Primary Care Physician: Bernerd Limbo, MD Admit Date: 06/02/2020  Reason for Consultation/Follow-up: Establishing goals of care  Subjective: Arthur [name] is resting in bed.    He denies complaints other than stating that he thinks he would benefit from being able to get xanax more frequently.  He finds it helpful but thinks that it does not last until he is due for next dose.  Discussed plan to transition home with hospice and he reports very much looking forward to being at home.  He denies other needs and was appreciative of visit and discussion regarding symptoms.  Length of Stay: 10  Current Medications: Scheduled Meds:  . (feeding supplement) PROSource Plus  30 mL Oral Daily  . allopurinol  300 mg Oral Daily  . budesonide (PULMICORT) nebulizer solution  0.5 mg Nebulization BID  . chlorhexidine  15 mL Mouth Rinse BID  . diltiazem  120 mg Oral Daily  . feeding supplement (GLUCERNA SHAKE)  237 mL Oral BID BM  . flecainide  200 mg Oral BID  . guaiFENesin  1,200 mg Oral BID  . hydroxychloroquine  200 mg Oral Daily  . insulin aspart  0-20 Units Subcutaneous TID WC  . insulin aspart  6 Units Subcutaneous TID WC  . insulin glargine  25 Units Subcutaneous Daily  . ipratropium-albuterol  3 mL Nebulization Q4H  . levothyroxine  88 mcg Oral Q0600  . loratadine  10 mg Oral Daily  . mouth rinse  15 mL Mouth Rinse q12n4p  . methylPREDNISolone (SOLU-MEDROL) injection  60 mg Intravenous Q12H  . pantoprazole  40 mg Oral Daily  . polyethylene glycol  17 g Oral BID  . psyllium  1 packet Oral Daily  . senna-docusate  1 tablet Oral BID   . torsemide  10 mg Oral Daily    Continuous Infusions:   PRN Meds: albuterol, ALPRAZolam, dextrose, hydrALAZINE, morphine CONCENTRATE, temazepam  Physical Exam         In no acute distress Diminished breath sounds S1-S2 Abdomen is not distended Awake alert oriented no focal deficits Does  get cough and appears more dyspneic when speaking for several minutes at a time  No edema   Vital Signs: BP 123/64 (BP Location: Left Arm)   Pulse 79   Temp 98.6 F (37 C) (Oral)   Resp 19   Ht 5\' 10"  (1.778 m)   Wt 70.4 kg   SpO2 99%   BMI 22.27 kg/m  SpO2: SpO2: 99 % O2 Device: O2 Device: Nasal Cannula O2 Flow Rate: O2 Flow Rate (L/min): 4 L/min  Intake/output summary:   Intake/Output Summary (Last 24 hours) at 06/12/2020 2113 Last data filed at 06/12/2020 1800 Gross per 24 hour  Intake 840 ml  Output 1100 ml  Net -260 ml   LBM: Last BM Date: 06/03/20 Baseline Weight: Weight: 66.2 kg Most recent weight: Weight: 70.4 kg       Palliative Assessment/Data:    Flowsheet Rows   Flowsheet Row Most Recent Value  Intake Tab   Referral Department Oncology  Unit at Time of Referral ICU  Palliative Care Primary Diagnosis Cancer  Date Notified 06/03/20  Palliative Care Type New Palliative care  Reason for referral Clarify Goals of Care  Date of Admission 06/02/20  Date first seen by Palliative Care 06/05/20  # of days Palliative referral response time 2 Day(s)  # of days IP prior to Palliative referral 1  Clinical Assessment   Psychosocial & Spiritual Assessment   Palliative Care Outcomes       Patient Active Problem List   Diagnosis Date Noted  . Metastatic melanoma (Arthur [name]) 06/07/2020  . Palliative care by specialist   . Goals of care, counseling/discussion   . General weakness   . Acute on chronic respiratory failure with hypoxia (Arthur [name]) 06/05/2020  . Metastatic malignant neoplasm (Arthur [name])   . Malnutrition of moderate degree 06/04/2020  . Hyperglycemia 06/03/2020  .  Hyperkalemia 06/03/2020  . PAF (paroxysmal atrial fibrillation) ([place]) 06/03/2020  . HTN (hypertension) 06/03/2020  . Hypothyroidism 06/03/2020  . Protein calorie malnutrition (Arthur [name]) 06/03/2020  . Brain metastases (Arthur [name]) 06/02/2020  . Hemoptysis 05/07/2020  . OSA treated with BiPAP 05/07/2020  . Chronic respiratory failure with hypoxia (Arthur [name]) 11/15/2018  . COPD GOLD IV  11/14/2018    Palliative Care Assessment & Plan   Patient Profile:    Assessment: 76 year old gentleman recently diagnosed with malignant melanoma presented with right upper lobe lung mass, bulky hilar and mediastinal lymphadenopathy also found to have metastatic disease to the brain bone and left adrenal gland. Medical oncology and radiation oncology are following.  Decisions pertaining to initiation of immunotherapy as well as palliative radiotherapy with SRS to brain lesions is being considered.   Palliative medicine consultation for ongoing goals of care discussions and for additional support.   Recommendations/Plan: - Plan is for transition home with hospice. - Dyspnea: continue morphine as needed - Anxiety: increase availability of xanax to 0.5mg  every 6 hours as needed  Code Status:    Code Status Orders  (From admission, onward)         Start     Ordered   06/03/20 0030  Do not attempt resuscitation (DNR)  Continuous       Question Answer Comment  In the event of cardiac or respiratory ARREST Do not call a "code blue"   In the event of cardiac or respiratory ARREST Do not perform Intubation, CPR, defibrillation or ACLS   In the event of cardiac or respiratory ARREST Use medication by any route, position, wound care, and other measures to relive pain and suffering. May use oxygen, suction and manual treatment of airway obstruction as needed  for comfort.      06/03/20 0029        Code Status History    Date Active Date Inactive Code Status Order ID Comments User Context   06/02/2020 2331 06/03/2020  0029 Full Code [id number]  Orene Desanctis, DO ED   Advance Care Planning Activity      Prognosis:  Unable to determine  Discharge Planning: To Be Determined  Care plan was discussed with  Patient Thank you for allowing the Palliative Medicine Team to assist in the care of this patient.   Total Time 20 Prolonged Time Billed  no    Greater than 50%  of this time was spent counseling and coordinating care related to the above assessment and plan.  Micheline Rough, MD  Please contact Palliative Medicine Team phone at [phone] for questions and concerns.

## 2020-06-13 LAB — GLUCOSE, CAPILLARY
Glucose-Capillary: 244 mg/dL — ABNORMAL HIGH (ref 70–99)
Glucose-Capillary: 246 mg/dL — ABNORMAL HIGH (ref 70–99)
Glucose-Capillary: 168 mg/dL — ABNORMAL HIGH (ref 70–99)

## 2020-06-13 MED ORDER — POLYETHYLENE GLYCOL 3350 17 G PO PACK: 17.0000 g | PACK | Freq: Two times a day (BID) | ORAL | 0 refills | Status: AC

## 2020-06-13 MED ORDER — SENNOSIDES-DOCUSATE SODIUM 8.6-50 MG PO TABS: 1.0000 | ORAL_TABLET | Freq: Two times a day (BID) | ORAL | 0 refills | Status: AC

## 2020-06-13 MED ORDER — INSULIN ASPART 100 UNIT/ML FLEXPEN: 6.0000 [IU] | PEN_INJECTOR | Freq: Three times a day (TID) | SUBCUTANEOUS | 0 refills | Status: AC

## 2020-06-13 MED ORDER — INSULIN ASPART 100 UNIT/ML FLEXPEN: 0.0000 [IU] | PEN_INJECTOR | Freq: Three times a day (TID) | SUBCUTANEOUS | 0 refills | Status: AC

## 2020-06-13 MED ORDER — PANTOPRAZOLE SODIUM 40 MG PO TBEC: 40.0000 mg | DELAYED_RELEASE_TABLET | Freq: Every day | ORAL | 0 refills | Status: AC

## 2020-06-13 MED ORDER — PROSOURCE PLUS PO LIQD: 30.0000 mL | Freq: Every day | ORAL | 0 refills | Status: AC

## 2020-06-13 MED ORDER — "PEN NEEDLES 3/16"" 31G X 5 MM MISC": 1.0000 | Freq: Three times a day (TID) | 0 refills | Status: AC

## 2020-06-13 MED ORDER — PREDNISONE 10 MG (21) PO TBPK: ORAL_TABLET | ORAL | 0 refills | Status: AC

## 2020-06-13 MED ORDER — INSULIN ASPART 100 UNIT/ML FLEXPEN: 6.0000 [IU] | PEN_INJECTOR | Freq: Three times a day (TID) | SUBCUTANEOUS | 0 refills | Status: DC

## 2020-06-13 MED ORDER — ALPRAZOLAM 0.5 MG PO TABS: 0.5000 mg | ORAL_TABLET | Freq: Four times a day (QID) | ORAL | 0 refills | Status: AC | PRN

## 2020-06-13 MED ORDER — BLOOD GLUCOSE MONITOR KIT: PACK | 0 refills | Status: AC

## 2020-06-13 MED ORDER — INSULIN ASPART 100 UNIT/ML FLEXPEN
0.0000 [IU] | PEN_INJECTOR | Freq: Three times a day (TID) | SUBCUTANEOUS | 0 refills | Status: DC
Start: 2020-06-13 — End: 2020-06-13

## 2020-06-13 MED ORDER — GLUCERNA SHAKE PO LIQD: 237.0000 mL | Freq: Two times a day (BID) | ORAL | 0 refills | Status: AC

## 2020-06-13 MED ORDER — SORBITOL 70 % SOLN
960.0000 mL | TOPICAL_OIL | Freq: Once | ORAL | Status: AC
  Administered 2020-06-13: 960 mL via RECTAL
  Filled 2020-06-13: qty 473

## 2020-06-13 MED ORDER — MORPHINE SULFATE (CONCENTRATE) 10 MG/0.5ML PO SOLN: 5.0000 mg | ORAL | 0 refills | Status: AC | PRN

## 2020-06-13 MED ORDER — INSULIN GLARGINE 100 UNIT/ML SOLOSTAR PEN: 25.0000 [IU] | PEN_INJECTOR | Freq: Every day | SUBCUTANEOUS | 0 refills | Status: AC

## 2020-06-13 MED ORDER — PSYLLIUM 95 % PO PACK: 1.0000 | PACK | Freq: Every day | ORAL | 0 refills | Status: AC

## 2020-06-13 NOTE — Progress Notes (Signed)
Inpatient Diabetes Program Recommendations  AACE/ADA: New Consensus Statement on Inpatient Glycemic Control (2015)  Target Ranges:  Prepandial:   less than 140 mg/dL      Peak postprandial:   less than 180 mg/dL (1-2 hours)      Critically ill patients:  140 - 180 mg/dL   Lab Results  Component Value Date   GLUCAP 246 (H) 06/13/2020   HGBA1C 7.8 (H) 06/03/2020    Review of Glycemic Control  Diabetes history: DM2 Outpatient Diabetes medications: Insulin pump Current orders for Inpatient glycemic control: Lantus 25 units QD, Novolog 0-20 units TID with meals + 6 units TID with meals  HgbA1C - 7.8%  Inpatient Diabetes Program Recommendations:     Educated patient's sister on insulin pen use at home. Reviewed all steps if insulin pen including attachment of needle, 2-unit air shot, dialing up dose, giving injection, removing needle, disposal of sharps, storage of unused insulin, disposal of insulin etc. Patient able to provide successful return demonstration. Also reviewed troubleshooting with insulin pen. MD to give patient Rxs for insulin pens and insulin pen needles.  To be d/ced on Lantus 25 units QD and Novolog 6 units TID + 0-20 TID. Steroids will be tapered next week.  Pt's sister instructed to check blood sugars before meals 3x/day. Discussed hypoglycemia s/s and treatment.   Hospice RN to assess pt's glycemic control for any needed changes to insulin.  Thank you. Lorenda Peck, RD, LDN, CDE Inpatient Diabetes Coordinator [phone]

## 2020-06-13 NOTE — TOC Transition Note (Signed)
Transition of Care [facility]) - CM/SW Discharge Note   Patient Details  Name: Arthur [name] MRN: [id number] Date of Birth: 1946-02-24  Transition of Care [facility]) CM/SW Contact:  Dessa Phi, RN Phone Number: 06/13/2020, 4:28 PM   Clinical Narrative: see prior note for additional info. PTAR for d/c to home-address confirmed.      Final next level of care: Home w Hospice Care Barriers to Discharge: No Barriers Identified   Patient Goals and CMS Choice Patient states their goals for this hospitalization and ongoing recovery are:: go home with hospice CMS Medicare.gov Compare Post Acute Care list provided to:: Patient Represenative (must comment) Velta Addison sister [phone])    Discharge Placement                Patient to be transferred to facility by: [place] Name of family member notified: Mary sister Patient and family notified of of transfer: 06/13/20  Discharge Plan and Services   Discharge Planning Services: CM Consult Post Acute Care Choice: Hospice                    HH Arranged: RN [facility] Agency: [facility] and [place] Date [place]: 06/12/20 Time [place]: 1141 Representative spoke with at [place]: [place] ([place]) Interventions     Readmission Risk Interventions Readmission Risk Prevention Plan 06/10/2020  Transportation Screening Complete  PCP or Specialist Appt within 3-5 Days Complete  HRI or [place] Complete  Social Work Consult for [place] Planning/Counseling Complete  Palliative Care Screening Complete  Medication Review Press photographer) Complete  Some recent data might be hidden

## 2020-06-13 NOTE — Progress Notes (Signed)
Inpatient Diabetes Program Recommendations  AACE/ADA: New Consensus Statement on Inpatient Glycemic Control (2015)  Target Ranges:  Prepandial:   less than 140 mg/dL      Peak postprandial:   less than 180 mg/dL (1-2 hours)      Critically ill patients:  140 - 180 mg/dL   Lab Results  Component Value Date   GLUCAP 244 (H) 06/13/2020   HGBA1C 7.8 (H) 06/03/2020    Review of Glycemic Control  Diabetes history: DM2 Outpatient Diabetes medications: [place] 35-50 units/day via pump Current orders for Inpatient glycemic control: Lantus 25 units QD, Novolog 0-20 units TID with meals + 6 units TID with meals  On Solumedrol 60 mg Q12H HgbA1C - 7.8%  Inpatient Diabetes Program Recommendations:     Agree with present insulin orders. If post-prandials remain elevated, increase Novolog to 8 units TID with meals.  Continue to follow.  Thank you. Lorenda Peck, RD, LDN, CDE Inpatient Diabetes Coordinator [phone]

## 2020-06-13 NOTE — Progress Notes (Signed)
Manufacturing engineer (ACC)    DME has been delivered.  ACC information and contact numbers given to sister Velta Addison.  Above information shared with [place] Alpha Manager. Per discussion pt will discharge home this evening via PTAR. [place] hospice will follow up to admit pt onto services.  Please call with any questions or concerns.  Thank you for the opportunity to participate in this pt's care.  Domenic Moras, BSN, RN [employer] [phone] (24h on call)

## 2020-06-13 NOTE — Progress Notes (Signed)
Patient discharged with belongings via PTAR with 4L of oxygen, no distress noted, sister Stanton Kidney notified.

## 2020-06-13 NOTE — Care Management Important Message (Signed)
Important Message  Patient Details IM Letter given to the Patient. Name: Arthur [name] MRN: [id number] Date of Birth: 10/15/1945   Medicare Important Message Given:  Yes     Kerin Salen 06/13/2020, 11:01 AM

## 2020-06-13 NOTE — Discharge Summary (Signed)
Physician Discharge Summary  Arthur [name] FAO:[id number] DOB: Aug 11, 1945 DOA: 06/02/2020  PCP: Bernerd Limbo, MD  Admit date: 06/02/2020 Discharge date: 06/13/2020  Admitted From: Home Disposition: Home with Home Hospice  Recommendations for Outpatient Follow-up:  1. Follow up with PCP in 1-2 weeks 2. Further Care per Hospice Protocol  3. Please obtain CMP/CBC, Mag Phos in one week 4. 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 Ander Slade, as well as other comorbidities who was found to have a right upper lobe mass after his office visit in January.  He is not a very good candidate for bronchoscopy given his severe emphysema and risk of pneumothorax.  PET scan was done and showed metastatic disease to the left adrenal and bone in addition to other nodules.  He presented to the emergency room 01/30/2021 after a fall causing his insulin pump to follow-up and his blood sugars were extremely high in the 500 range.  He had significant weakness and had inability stand being down on the ground.  He had a head CT done which showed a solid mass in the inferior frontal lobe on the right side with edema necrotic masses in the right hemisphere with surrounding edema with a 2 mm right to left shift.  Oncology was consulted and he was started on Decadron.  Neurosurgery Dr. Glenford Peers was consulted and did not advise any further imaging.  Hyperglycemia was aggressively treated with  IV insulin initially and then transitioned to long-acting.  He is status post lymph node biopsy consistent with metastatic melanoma.  Patient has been seen by radiation oncology as well as medical oncology and is status post 3T MRI.  Palliative care has been following.  After subsequent Palliatieve Care discussions on 05/22/2020 patient and the family decided to go home with hospice and palliative care is currently following and appreciate their assistance with this patient. To be discharged today with Hospice Support.  Discharge Diagnoses:  Principal Problem:   Metastatic melanoma ([place]) Active Problems:   Chronic respiratory failure with hypoxia (HCC)   Brain metastases (HCC)   Hyperglycemia   Hyperkalemia   PAF (paroxysmal atrial fibrillation) (HCC)   HTN (hypertension)   Hypothyroidism   Protein calorie malnutrition (HCC)   Malnutrition of moderate degree   Acute on chronic respiratory failure with hypoxia (HCC)   Metastatic malignant neoplasm ([place])   Palliative care by specialist   Goals of care, counseling/discussion   General weakness  Metastatic malignant melanoma /right upper lobe lung mass with mediastinal and hilar adenopathy withprobable adrenal mets, bone mets, brain metastases -Patient presented with ataxia with difficulty standing.  -Work-up done including head CT and MRI brain concerning for metastatic disease. Concern for metastatic lung cancer.  -Patient noted to be evaluated in the outpatient setting by pulmonary medicine and PET scan was performed May 21, 2020 with findings compatible with primary bronchogenic carcinoma with findings concerning for adrenal mets, bone mets.  -Patient started on Decadron as an outpatient. Patient seen in consultation by radiation oncology as well as medical oncology who  recommended  -CT-guided biopsy of one of his lymph nodes per IR which was done on 06/04/2020 with pathology consistent with malignant melanoma.  -Medical oncology,  radiation oncology, neuro-oncology following and have discussed with patient and family on prognosis and palliative treatment with immunotherapy.  -Patient thinking about recommendations to decide whether to proceed with treatment at this time. Patient s/p 3T MRI scan (2/18) per radiation oncology and deemed to be a candidate for SRS. Per medical oncology/neuro-oncology/radiation oncology.  -Patient and sister have decided to transition home with hospice.  -Palliative care following currently symptoms well controlled. Hospice liaison to meet with patient and family to discuss logistics for transfer for home with hospice discharge and anticipating after equipment is delivered home. -Appreciate palliative care input and recommendations and patient to go home with Hospice today   Acute on Chronic Respiratory Failure secondary to severe COPD on 4 L nasal cannula at baseline in the setting of metastatic probable lung cancer/probable acute COPD exacerbation -Patient currently on 4 L nasal cannula with sats around94-100%.  -He had Worsening respiratory status noted on 06/06/2020, likely secondary to metastatic lung cancer/metastatic melanoma and acute COPD exacerbation.  -Cervical biopsy consistent with malignant melanoma.. Patient states no significant change of chronic shortness of breath.  -Patient was visibly short of breath however has improved and likely close to baseline. No current use of accessory muscles of respiration.  -SpO2: 91 % O2 Flow Rate (L/min): 4 L/min FiO2 (%): 40 % -ABG Labs (Brief)          Component Value Date/Time   PHART 7.392 06/06/2020 1217   PCO2ART 63.6 (H) 06/06/2020 1217   PO2ART 91.7 06/06/2020 1217   HCO3 37.8 (H) 06/06/2020 1217   TCO2 32 06/02/2020 2233   TCO2 32 06/02/2020 2233   ACIDBASEDEF 0.6 06/02/2020 2238   O2SAT 96.3 06/06/2020 1217    -Decrease IV Solu-Medrol to 60 mg every 12hours and will change to 60 mg Daily in the AM and  then Transition to Sterapred and then po Decadron after D/C per Hospice   -Continue to taper IV Solu-Medrol and transition to Decadron as an outpatient  -Continue Mucinex, Pulmicort nebs, scheduled duo nebs, BiPAP nightly. Chest PT. Supportive care and will transition to Home Hospice -Has Morphine Concentrate 5 mg po q4hprn Severe Pain, Dyspnea, Air Hunger and Alprazolam -Continue to Monitor Respiratory Status as an outpatient   Paroxysmal Atrial Fibrillation -In NSR. -Continue Cardizem, Tambocor for rate control -Not on anticoagulation. Follow.   Type 2 Diabetes Mellitus on insulin pump at home -Patient noted to have pump not functioning well.  -On presentation patient noted to be hypoglycemic requiring insulin drip and have subsequently been transitioned to subcutaneous Lantus, sliding scale insulin.  -Patientwith low CBGs yesterday evening and as such Lantus decreased to 15 units daily. CBGs of 183 this morning and elevated to 351 by lunchtime.  -Increase Lantus back to 25 units daily. Start NovoLog meal coverage 6 units 3 times daily with meals to start tomorrow. Resistant Sliding scale insulin at D/C. Monitor CBGs with steroid taper  -CBGs ranging form 73-246  Hypothyroidism -C/w Levothyroxine 88 mcg po Daily .   Probable Colon Cancer -Patient noted with concerns for possible colon cancer.  -Patient will be a poor candidate for further work-up at this time.  -Patient decided on transitioning home with hospice.  Prior history of Malignant Melanoma -As above. Further care per Oncology   Hypertension -Continue current regimen of Cardizem. HCTZdiscontinued. Will resume Torsemide at Discharge  Constipation -Continue current bowel regime with Miralax 17 g po BID, Psyllium 1 packet Daily and Senna-Docusate 1 tab po BID.  -Give SMOG Enema prior to D/C   Non-Severe (Moderate) Malnutrition in the Context of Chronic Illness -C/w Glucerna Shake BID and 30 mL  Prosource Plus once/day  Anxiety -Resumed Alprazolam 0.5 mg po BID  Disposition -Patient has decided to focus on comfort and wanting to transition home with hospice support. Hospice liaison to set up meeting with patient and sister to discuss and set up logistics for transitioning home with hospice care.  -Equipment to be delivered so D/C after this is done  Discharge Instructions  Discharge Instructions    Call MD for:  difficulty breathing, headache or visual disturbances   Complete by: As directed    Call MD for:  extreme fatigue   Complete by: As directed    Call MD for:  hives   Complete by: As directed    Call MD for:  persistant dizziness or light-headedness   Complete by: As directed    Call MD for:  persistant nausea and vomiting   Complete by: As directed    Call MD for:  redness, tenderness, or signs of infection (pain, swelling, redness, odor or green/yellow discharge around incision site)   Complete by: As directed    Call MD for:  severe uncontrolled pain   Complete by: As directed    Call MD for:  temperature >100.4   Complete by: As directed    Diet - low sodium heart healthy   Complete by: As directed    Discharge instructions   Complete by: As directed    You were cared for by a hospitalist during your hospital stay. If you have any questions about your discharge medications or the care you received while you were in the hospital after you are discharged, you can call the unit and ask to speak with the hospitalist on call if the hospitalist that took care of you is not available. Once you are discharged, your primary care physician will handle any further medical issues. Please note that NO REFILLS for any discharge medications will be authorized once you are discharged, as it is imperative that you return to your primary care physician (or establish a relationship with a primary care physician if you do not have one) for your aftercare needs so that they can  reassess your need for medications and monitor your lab values.  Follow up with Hospice and further care per protocol. Take all medications as prescribed. If symptoms change or worsen please return to the ED for evaluation   Increase activity slowly   Complete by: As directed    No wound care   Complete by: As directed      Allergies as of 06/13/2020      Reactions   Mefloquine Hcl Nausea And Vomiting   Altered mental status       Medication List    STOP taking these medications   doxycycline 100 MG tablet Commonly known as: VIBRA-TABS   hydrochlorothiazide 12.5 MG capsule Commonly known as: MICROZIDE   insulin aspart 100 UNIT/ML injection Commonly known as: novoLOG Replaced by: insulin aspart 100 UNIT/ML FlexPen     TAKE these medications   feeding supplement (GLUCERNA SHAKE) Liqd Take 237 mLs by mouth 2 (two) times daily between meals.   (feeding supplement) PROSource Plus liquid Take 30 mLs by mouth daily. Start taking on: June 14, 2020   albuterol 108 (90  Base) MCG/ACT inhaler Commonly known as: VENTOLIN HFA Inhale 2 puffs into the lungs every 6 (six) hours as needed for wheezing or shortness of breath.   ALLOPURINOL PO Take 300 mg by mouth daily.   ALPRAZolam 0.5 MG tablet Commonly known as: XANAX Take 1 tablet (0.5 mg total) by mouth every 6 (six) hours as needed for anxiety.   blood glucose meter kit and supplies Kit Dispense based on patient and insurance preference. Use up to four times daily as directed. (FOR ICD-9 250.00, 250.01).   diltiazem 120 MG tablet Commonly known as: CARDIZEM Take 120 mg by mouth daily.   flecainide 100 MG tablet Commonly known as: TAMBOCOR Take 200 mg by mouth 2 (two) times daily.   Flutter Devi 1 Device by Does not apply route as needed.   guaiFENesin 600 MG 12 hr tablet Commonly known as: MUCINEX Take 1,200 mg by mouth 2 (two) times daily.   hydroxychloroquine 200 MG tablet Commonly known as: PLAQUENIL Take  200 mg by mouth daily.   insulin aspart 100 UNIT/ML FlexPen Commonly known as: NOVOLOG Inject 6 Units into the skin 3 (three) times daily with meals. Replaces: insulin aspart 100 UNIT/ML injection   insulin aspart 100 UNIT/ML FlexPen Commonly known as: NOVOLOG Inject 0-20 Units into the skin 3 (three) times daily with meals. Correction coverage: Resistant (obese, steroids) CBG < 70: Implement Hypoglycemia Standing Orders and refer to Hypoglycemia Standing Orders sidebar report CBG 70 - 120: 0 units CBG 121 - 150: 3 units CBG 151 - 200: 4 units CBG 201 - 250: 7 units CBG 251 - 300: 11 units CBG 301 - 350: 15 units CBG 351 - 400: 20 units CBG > 400: call MD   insulin glargine 100 UNIT/ML Solostar Pen Commonly known as: LANTUS Inject 25 Units into the skin daily.   levothyroxine 88 MCG tablet Commonly known as: SYNTHROID Take 88 mcg by mouth daily before breakfast.   morphine CONCENTRATE 10 MG/0.5ML Soln concentrated solution Take 0.25 mLs (5 mg total) by mouth every 4 (four) hours as needed for severe pain (dyspnea, air hunger).   OXYGEN Place into the nose. 4 liters continuously   pantoprazole 40 MG tablet Commonly known as: PROTONIX Take 1 tablet (40 mg total) by mouth daily. Start taking on: June 14, 2020   Pen Needles 3/16" 31G X 5 MM Misc 1 Container by Does not apply route 4 (four) times daily -  before meals and at bedtime.   polyethylene glycol 17 g packet Commonly known as: MIRALAX / GLYCOLAX Take 17 g by mouth 2 (two) times daily.   potassium chloride SA 20 MEQ tablet Commonly known as: KLOR-CON Take 20 mEq by mouth daily.   predniSONE 10 MG (21) Tbpk tablet Commonly known as: STERAPRED UNI-PAK 21 TAB Take 6 pills on day 1, 5 pills on day 2, 4 pills on day 3, 3 pills on day 4, 2 pills on day 5, 1 pill on day 6 and then stop   psyllium 95 % Pack Commonly known as: HYDROCIL/METAMUCIL Take 1 packet by mouth daily. Start taking on: June 14, 2020    senna-docusate 8.6-50 MG tablet Commonly known as: Senokot-S Take 1 tablet by mouth 2 (two) times daily.   temazepam 30 MG capsule Commonly known as: RESTORIL Take 30 mg by mouth at bedtime as needed for sleep.   torsemide 10 MG tablet Commonly known as: DEMADEX Take 10 mg by mouth daily.   Trelegy Ellipta 200-62.5-25 MCG/INH Aepb Generic  drug: Fluticasone-Umeclidin-Vilant Inhale 1 puff into the lungs daily.   UNABLE TO FIND Med Name: BIPAP with 3lpm o2       Follow-up Information    [facility].   Specialty: PALLIATIVE CARE Why: home nursing visit Contact information: [place] [phone]             Allergies  Allergen Reactions  . Mefloquine Hcl Nausea And Vomiting    Altered mental status    Consultations:  Palliative Care Medicine  Procedures/Studies: CT HEAD WO CONTRAST  Result Date: 06/02/2020 CLINICAL DATA:  [place].  History of metastatic lung cancer. EXAM: CT HEAD WITHOUT CONTRAST TECHNIQUE: Contiguous axial images were obtained from the base of the skull through the vertex without intravenous contrast. COMPARISON:  PET scan 05/21/2020 FINDINGS: Brain: No focal abnormality seen affecting the brainstem or cerebellum. There is a 1 cm mass at the inferior frontal lobe on the right with surrounding edema. There is a necrotic mass in the right frontal lobe measuring 3.3 cm in diameter, with surrounding edema, consistent with a metastasis. There is a necrotic mass at the right parietal vertex measuring up to 2.8 cm in diameter consistent with a metastasis. Some surrounding edema in this location as well. No metastasis to the left hemisphere identified. No hydrocephalus. Right-to-left shift of 2 mm. No extra-axial collection. Vascular: There is atherosclerotic calcification of the major vessels at the base of the brain. Skull: No fracture. No evidence skull or skull base metastatic lesion. Sinuses/Orbits:  Mucosal inflammatory changes of the left maxillary sinus and left ethmoid sinuses. Orbits negative. Other: None IMPRESSION: 1 cm solid mass in the inferior frontal lobe on the right with mild edema. Two necrotic masses in the right hemisphere, located in the right frontal lobe and right parietal vertex consistent with metastases. Surrounding edema. 2 mm of right-to-left shift. No hydrocephalus. No traumatic finding. Non necrotic portions of the metastases are slightly hyperdense, likely indicating the presence of petechial hemorrhage. This hemorrhage probably relates to the biology of the tumor rather than the recent fall. Electronically Signed   By: Nelson Chimes M.D.   On: 06/02/2020 22:23   MR BRAIN W WO CONTRAST  Result Date: 06/07/2020 CLINICAL DATA:  Metastatic lung cancer EXAM: MRI HEAD WITHOUT AND WITH CONTRAST TECHNIQUE: Multiplanar, multiecho pulse sequences of the brain and surrounding structures were obtained without and with intravenous contrast. CONTRAST:  6.29m GADAVIST GADOBUTROL 1 MMOL/ML IV SOLN COMPARISON:  06/04/2020 FINDINGS: Motion artifact is present. Brain: 7 stable parenchymal metastases are identified on series 1100, images 133, 162, 168, 192, and 217 as annotated. Larger lesions demonstrate associated susceptibility consistent with intralesional hemorrhage. Associated edema is similar with minor mass effect. No acute infarction. No hydrocephalus. Additional patchy T2 hyperintensity in the supratentorial white matter likely reflecting stable chronic microvascular ischemic changes. Vascular: Major vessel flow voids at the skull base are preserved. Skull and upper cervical spine: Normal marrow signal is preserved. Sinuses/Orbits: Paranasal sinus mucosal thickening. Orbits are unremarkable. Other: Sella is unremarkable.  Mastoid air cells are clear. IMPRESSION: Stable 7 metastatic lesions. Stable associated mild edema and mass effect. Electronically Signed   By: PMacy MisM.D.   On:  06/07/2020 15:51   MR BRAIN W WO CONTRAST  Result Date: 06/04/2020 CLINICAL DATA:  Metastatic lung cancer staging. EXAM: MRI HEAD WITHOUT AND WITH CONTRAST TECHNIQUE: Multiplanar, multiecho pulse sequences of the brain and surrounding structures were obtained without and with intravenous contrast. CONTRAST:  662m  GADAVIST GADOBUTROL 1 MMOL/ML IV SOLN COMPARISON:  Head CT from 2 days ago FINDINGS: Brain: Progressively enhancing brain masses consistent with metastatic disease: 1. 9 mm in the right frontal lobe on 16:29 2. 3.4 cm in the higher right frontal lobe on 16:35 3. 9 mm in the parasagittal left frontal lobe on 16:41 4. 9 mm along the right frontal parietal junction measured on 16:42 5. 2.7 cm in the parasagittal right frontal parietal cortex measured on 16:40. The enhancing area was measured on axial slices. Equivocal subcentimeter nodule in the upper vermis seen on sagittal postcontrast imaging but not clearly visualized on axial slices and at a level not covered on coronal postcontrast imaging. There is a small nodular area along the posterior left temporal lobe on axial slices, 67:20, not seen on the other sequences. The metastases show variable amounts of blood products. Moderate vasogenic edema in the right cerebral hemisphere adjacent to the largest metastases. Vascular: Normal flow voids and vascular enhancements. Skull and upper cervical spine: No focal marrow lesion. Sinuses/Orbits: Negative for mass. Mucosal thickening in the left maxillary sinus. IMPRESSION: At least 5 brain metastases with the largest lesion showing blood products and moderate vasogenic edema. Potential for 2 additional subcentimeter metastases, certainty limited by relatively hypoenhancing characteristics and patient motion. Electronically Signed   By: Monte Fantasia M.D.   On: 06/04/2020 11:13   NM PET Image Initial (PI) Skull Base To Thigh  Result Date: 05/21/2020 CLINICAL DATA:  Initial treatment strategy for bilateral  pulmonary nodules and bulky thoracic adenopathy on recent chest CT. EXAM: NUCLEAR MEDICINE PET SKULL BASE TO THIGH TECHNIQUE: 7.4 mCi F-18 FDG was injected intravenously. Full-ring PET imaging was performed from the skull base to thigh after the radiotracer. CT data was obtained and used for attenuation correction and anatomic localization. Fasting blood glucose: 244 mg/dl COMPARISON:  05/06/2020 chest CT angiogram. FINDINGS: Mediastinal blood pool activity: SUV max 2.3 Liver activity: SUV max NA NECK: Multiple enlarged hypermetabolic lymph nodes throughout the right greater than left neck involving levels II, III, IV and V on the right and level I on the left. Representative 1.4 cm left level Ib node with max SUV 4.8 near the left angle of mandible (series 4/image 33). Representative 1.7 cm right level IIb lymph node with max SUV 5.5 (series 4/image 32). Representative 1.8 cm right level IV neck lymph node with max SUV 4.5 (series 4/image 41). Representative 1.7 cm right level V neck lymph node with max SUV 5.2 (series 4/image 39). Incidental CT findings: Mucoperiosteal thickening throughout the left greater than right maxillary sinuses and ethmoidal air cells, with no fluid levels. CHEST: Hypermetabolic irregular 3.3 cm anterior right upper lobe lung mass with max SUV 4.5 (series 8/image 20). Numerous (greater than 10) irregular solid pulmonary nodules scattered throughout both lungs, the largest of which demonstrate mild hypermetabolism with representative peripheral left upper lobe 2.2 cm nodule with max SUV 2.7 (series 8/image 33) and representative 0.4 cm peripheral left upper lobe nodule with max SUV 2.0 (series 8/image 19). Mildly enlarged hypermetabolic 1.1 cm left axillary lymph node with max SUV 2.7 (series 4/image 54). No hypermetabolic right axillary nodes. Bulky hypermetabolic bilateral mediastinal adenopathy involving prevascular, AP window, paratracheal and subcarinal chains. Representative 1.6 cm  left prevascular node with max SUV 4.5 (series 4/image 60). Representative 1.6 cm right prevascular node with max SUV 5.2 (series 4/image 69). Representative 4.9 cm right paratracheal node with max SUV 4.3 (series 4/image 63). Representative 1.8 cm subcarinal node with  max SUV 4.6 (series 4/image 81). Hypermetabolic 2.6 cm right hilar node with max SUV 4.4 (series 4/image 78). No hypermetabolic left hilar nodes. Incidental CT findings: Coronary atherosclerosis. Atherosclerotic nonaneurysmal thoracic aorta. Severe centrilobular emphysema. ABDOMEN/PELVIS: Hypermetabolic 1.9 cm left adrenal nodule with density 21 HU and max SUV 3.0. New hypermetabolic right adrenal nodules. No abnormal hypermetabolic activity within the liver, pancreas or spleen. No hypermetabolic lymph nodes in the abdomen or pelvis. Incidental CT findings: Nonobstructing 7 mm upper left renal stone. Simple 3.4 cm lower left renal cyst. Atherosclerotic nonaneurysmal abdominal aorta. SKELETON: Hypermetabolic midline sacral lesion with max SUV 4.2 and hypermetabolic L2 vertebral lesion with max SUV 5.1, relatively CT occult. Incidental CT findings: none IMPRESSION: 1. Hypermetabolic irregular 3.3 cm anterior right upper lobe lung mass, compatible with primary bronchogenic carcinoma. 2. Numerous (greater than 10) irregular solid bilateral pulmonary nodules, the largest of which are mildly hypermetabolic, compatible with metastatic disease. 3. Bulky hypermetabolic ipsilateral hilar, bilateral mediastinal and bilateral neck nodal metastases. 4. Hypermetabolic left adrenal metastasis. 5. 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 6. 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 7. Chronic findings include: Aortic Atherosclerosis (ICD10-I70.0) and Emphysema (ICD10-J43.9). Coronary atherosclerosis. Nonobstructing left nephrolithiasis. Chronic appearing bilateral paranasal sinusitis. Electronically Signed   By: Ilona Sorrel M.D.   On:  05/21/2020 14:06   DG CHEST PORT 1 VIEW  Result Date: 06/06/2020 CLINICAL DATA:  Shortness of breath. EXAM: PORTABLE CHEST 1 VIEW COMPARISON:  June 02, 2020. FINDINGS: Stable right hilar and paratracheal adenopathy is noted. Stable appearance of right upper lobe lung mass is noted. No pneumothorax or pleural effusion is noted. Stable right upper and lower lobe opacities are noted concerning for possible scarring or inflammation. The visualized skeletal structures are unremarkable. IMPRESSION: Stable right hilar and paratracheal adenopathy. Stable appearance of right upper lobe lung mass. Stable right upper and lower lobe opacities are noted concerning for possible scarring or inflammation. Electronically Signed   By: Marijo Conception M.D.   On: 06/06/2020 11:42   DG Chest Portable 1 View  Result Date: 06/02/2020 CLINICAL DATA:  Shortness of breath.  COPD.  Lung cancer suspected. EXAM: PORTABLE CHEST 1 VIEW COMPARISON:  05/02/2020 radiography.  PET scan 05/21/2020 FINDINGS: Heart size is normal. Right hilar adenopathy and right paratracheal adenopathy have worsened. Mass lesion in the right upper lobe consistent with a malignancy shown by PET scan. 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 No consolidation, collapse or effusion. No focal bone lesion. IMPRESSION: Worsened right hilar and right paratracheal adenopathy. Mass lesion in the right upper lobe consistent with a malignancy shown by PET scan. 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 No other acute process. Electronically Signed   By: Nelson Chimes M.D.   On: 06/02/2020 22:16   [place] CORE BIOPSY (LYMPH NODES)  Result Date: 06/04/2020 INDICATION: Suspect metastatic lung cancer, right cervical adenopathy EXAM: ULTRASOUND GUIDED CORE BIOPSY OF RIGHT CERVICAL ADENOPATHY MEDICATIONS: 1% LIDOCAINE LOCAL  ANESTHESIA/SEDATION: Versed 1.41m IV; Fentanyl 536m IV; Moderate Sedation Time:  10 MINUTES The patient was continuously monitored during the procedure by the interventional radiology nurse under my direct supervision. FLUOROSCOPY TIME:  Fluoroscopy Time: None. COMPLICATIONS: None immediate. PROCEDURE: The procedure, risks, benefits, and alternatives were explained to the patient. Questions regarding the procedure were encouraged and answered. The patient understands and consents to the procedure. Previous imaging reviewed. Preliminary ultrasound performed. Abnormal bulky  right cervical adenopathy localized and marked. Under sterile conditions and local anesthesia, 18 gauge core biopsy advanced under ultrasound into the lymph nodes. 4 18 gauge core biopsies obtained. These were intact and non fragmented. Samples placed in saline. Needle removed. Postprocedure imaging demonstrates no hemorrhage or hematoma. Patient tolerated biopsy well. FINDINGS: Imaging confirms needle placed in the right cervical adenopathy for core biopsy. IMPRESSION: Successful ultrasound right cervical adenopathy 18 gauge core biopsies Electronically Signed   By: Jerilynn Mages.  Shick M.D.   On: 06/04/2020 16:53     Subjective: Seen and examined at bedside and he was doing ok and wanting home. Denied any CP or SOB. Anxiety is controlled. Ready to go home. No other concerns or complaints at this time.   Discharge Exam: Vitals:   06/13/20 1153 06/13/20 1320  BP:  (!) 100/52  Pulse:  62  Resp:  18  Temp:  97.7 F (36.5 C)  SpO2: 97% 91%   Vitals:   06/13/20 0500 06/13/20 0804 06/13/20 1153 06/13/20 1320  BP:    (!) 100/52  Pulse:    62  Resp:    18  Temp:    97.7 F (36.5 C)  TempSrc:    Oral  SpO2:  96% 97% 91%  Weight: 70.4 kg     Height:       General: Pt is alert, awake, not in acute distress Cardiovascular: RRR, S1/S2 +, no rubs, no gallops Respiratory: Diminished bilaterally, no wheezing, no rhonchi Abdominal: Soft, NT,  ND, bowel sounds + Extremities: Trace edema, no cyanosis  The results of significant diagnostics from this hospitalization (including imaging, microbiology, ancillary and laboratory) are listed below for reference.    Microbiology: No results found for this or any previous visit (from the past 240 hour(s)).   Labs: BNP (last 3 results) No results for input(s): BNP in the last 8760 hours. Basic Metabolic Panel: Recent Labs  Lab 06/07/20 0233 06/08/20 0227 06/09/20 0545 06/10/20 0508 06/11/20 0525  NA 141 142 139 140 139  K 4.8 4.0 4.2 4.2 4.6  CL 96* 93* 91* 90* 89*  CO2 36* 37* 39* 39* 38*  GLUCOSE 276* 205* 288* 194* 206*  BUN 37* 40* 38* 41* 39*  CREATININE 0.68 0.79 0.80 0.65 0.92  CALCIUM 9.1 8.8* 8.4* 8.3* 8.3*  MG  --   --  2.2 2.4  --    Liver Function Tests: Recent Labs  Lab 06/07/20 0233  AST 20  ALT 23  ALKPHOS 106  BILITOT 0.9  PROT 5.5*  ALBUMIN 3.2*   No results for input(s): LIPASE, AMYLASE in the last 168 hours. No results for input(s): AMMONIA in the last 168 hours. CBC: Recent Labs  Lab 06/07/20 0233 06/10/20 0508 06/11/20 0525  WBC 8.8 12.6* 14.6*  NEUTROABS 8.3*  --   --   HGB 14.3 15.0 15.3  HCT 43.8 45.4 46.5  MCV 94.6 94.4 94.9  PLT 236 209 229   Cardiac Enzymes: No results for input(s): CKTOTAL, CKMB, CKMBINDEX, TROPONINI in the last 168 hours. BNP: Invalid input(s): POCBNP CBG: Recent Labs  Lab 06/12/20 1128 06/12/20 1621 06/12/20 2040 06/13/20 0741 06/13/20 1159  GLUCAP 186* 94 73 244* 246*   D-Dimer No results for input(s): DDIMER in the last 72 hours. Hgb A1c No results for input(s): HGBA1C in the last 72 hours. Lipid Profile No results for input(s): CHOL, HDL, LDLCALC, TRIG, CHOLHDL, LDLDIRECT in the last 72 hours. Thyroid function studies No results for input(s): TSH, T4TOTAL, T3FREE,  THYROIDAB in the last 72 hours.  Invalid input(s): FREET3 Anemia work up No results for input(s): VITAMINB12, FOLATE,  FERRITIN, TIBC, IRON, RETICCTPCT in the last 72 hours. Urinalysis    Component Value Date/Time   COLORURINE YELLOW 06/02/2020 2352   APPEARANCEUR CLEAR 06/02/2020 2352   LABSPEC 1.017 06/02/2020 2352   PHURINE 5.0 06/02/2020 2352   GLUCOSEU >=500 (A) 06/02/2020 2352   HGBUR NEGATIVE 06/02/2020 2352   BILIRUBINUR NEGATIVE 06/02/2020 2352   KETONESUR 20 (A) 06/02/2020 2352   PROTEINUR NEGATIVE 06/02/2020 2352   UROBILINOGEN 0.2 06/24/2010 1324   NITRITE NEGATIVE 06/02/2020 2352   LEUKOCYTESUR NEGATIVE 06/02/2020 2352   Sepsis Labs Invalid input(s): PROCALCITONIN,  WBC,  LACTICIDVEN Microbiology No results found for this or any previous visit (from the past 240 hour(s)).  Time coordinating discharge: 35 minutes  SIGNED:  Kerney Elbe, DO [facility] 06/13/2020, 5:02 PM Pager is on [place]  If 7PM-7AM, please contact night-coverage [email or web address]

## 2020-06-13 NOTE — TOC Progression Note (Addendum)
Transition of Care [facility]) - Progression Note    Patient Details  Name: Arthur [name] MRN: [id number] Date of Birth: [birth year]  [facility] CM/SW Contact  Mahabir, Juliann Pulse, RN Phone Number: 06/13/2020, 2:01 PM  Clinical Narrative: d/c plans-home w/hospice-[facility] rep Chrislyn following for d/c today-DME to be delivered to home prior d/c-[facility] rep Shelby will confirm w/[employer] delivery of dme. Nsg/DM ed-to confirm [name] sister-instruction on insulin-documentation needed for return demo for safe d/c home.Will arrange PTAR once all completed-forms in printer for nsg to put in packet.  4:27p-PTAR called. No further CM needs.     Expected Discharge Plan: Home w Hospice Care Barriers to Discharge: Continued Medical Work up  Expected Discharge Plan and Services Expected Discharge Plan: [place]   Discharge Planning Services: CM Consult Post Acute Care Choice: Hospice Living arrangements for the past 2 months: Single Family Home                           HH Arranged: RN [facility] Agency: Hospice and [place] Date [place]: 06/12/20 Time [place]: 1141 Representative spoke with at [place]: [place] ([place]) Interventions    Readmission Risk Interventions Readmission Risk Prevention Plan 06/10/2020  Transportation Screening Complete  PCP or Specialist Appt within 3-5 Days Complete  HRI or [place] Complete  Social Work Consult for [place] Planning/Counseling Complete  Palliative Care Screening Complete  Medication Review [employer]) Complete  Some recent data might be hidden

## 2020-06-18 DEATH — deceased

## 2021-07-31 IMAGING — MR MR HEAD WO/W CM
11 of 14 series · 23 of 48 positions shown · IV contrast (6.5 M gad)
Comparison: 06/04/2020

CLINICAL DATA: Metastatic lung cancer

EXAM:
MRI HEAD WITHOUT AND WITH CONTRAST
TECHNIQUE: Multiplanar, multiecho pulse sequences of the brain and surrounding
structures were obtained without and with intravenous contrast.
CONTRAST:  6.5mL GADAVIST GADOBUTROL 1 MMOL/ML IV SOLN

[Series 2: FLAIR · sagittal · 3.0mm · 0.47mm/px · 1 of 46 slices shown (1 of 2)]
[im 1/46]
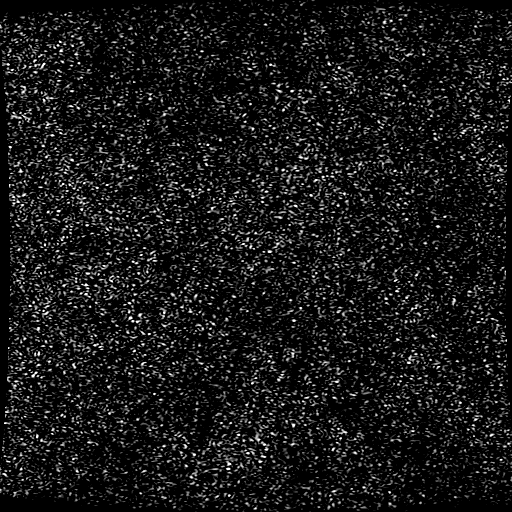

[Series 3: DWI · axial · 3.0mm · 0.94mm/px · z∈[-122,+73]mm · 3 of 132 slices shown]
[im 1/132]
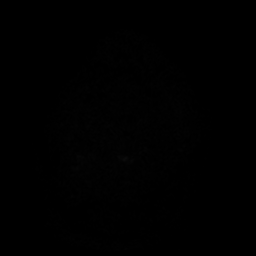
[im 66/132]
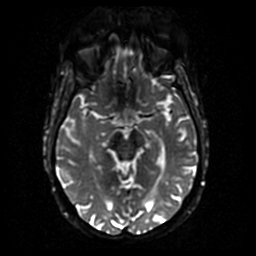
[im 132/132]
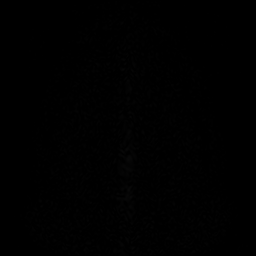

[Series 4: FLAIR · axial · 3.0mm · 0.47mm/px · z∈[-120,+72]mm · 2 of 65 slices shown (2 of 2)]
[im 1/65]
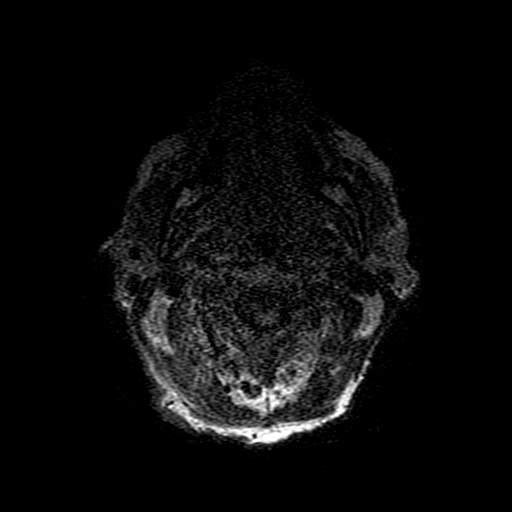
[im 65/65]
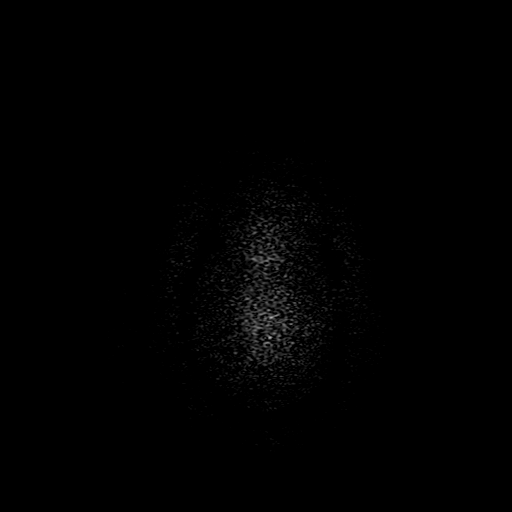

[Series 5: SWI · axial · 3.0mm · 0.47mm/px · z∈[-116,+72]mm · 3 of 126 slices shown (1 of 2)]
[im 1/126]
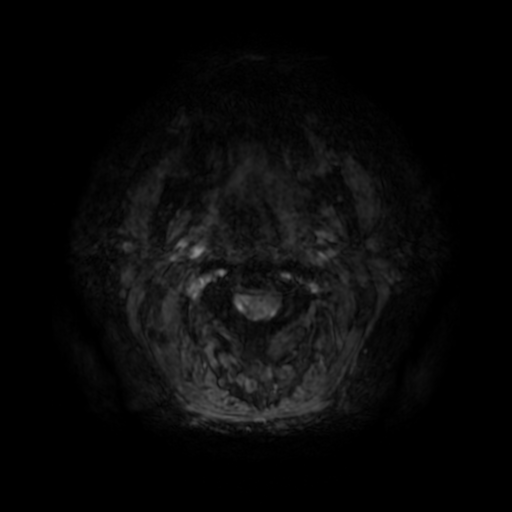
[im 63/126]
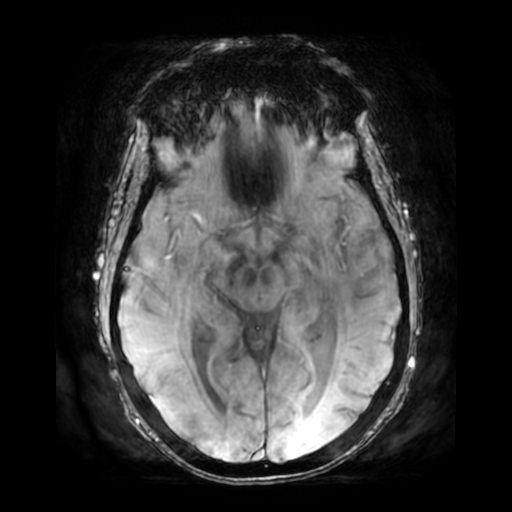
[im 126/126]
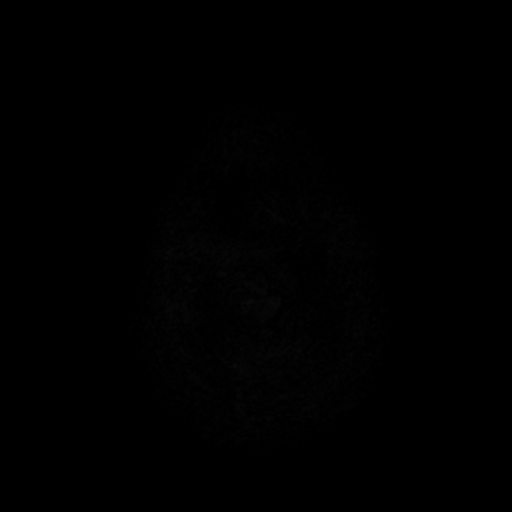

[Series 7: T2 post-contrast · coronal · 3.0mm · 0.39mm/px · 1 of 57 slices shown (1 of 2)]
[im 1/57]
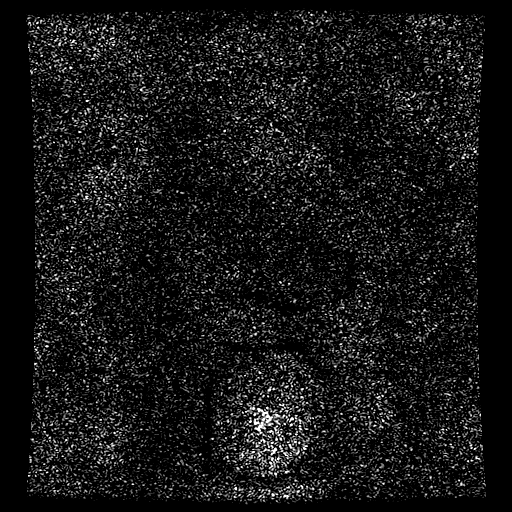

[Series 8: T2 post-contrast · axial · 5.0mm · 0.51mm/px · 1 of 34 slices shown (2 of 2)]
[im 1/34]
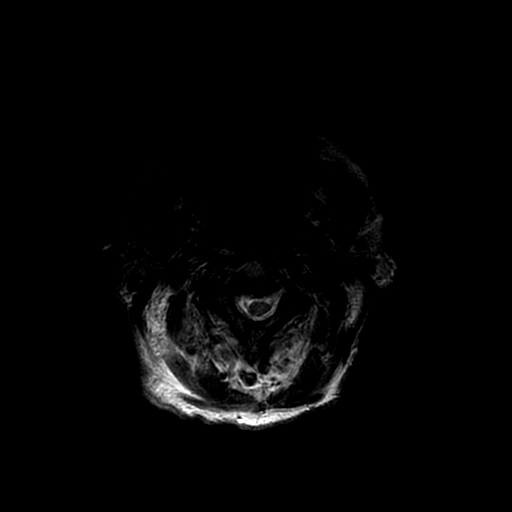

[Series 9: T1 post-contrast · coronal · 3.0mm · 0.39mm/px · 1 of 57 slices shown (1 of 2)]
[im 1/57]
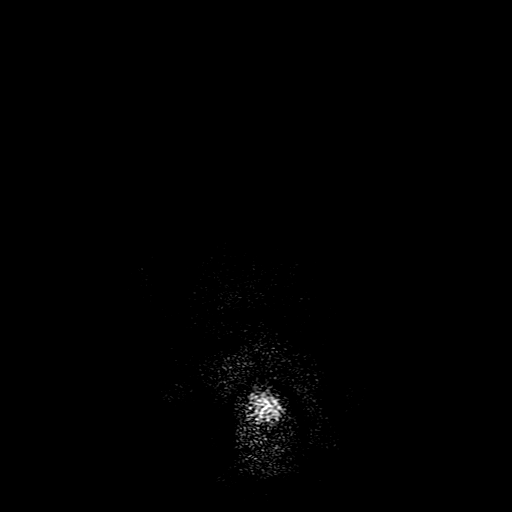

[Series 10: FLAIR post-contrast · sagittal · 3.0mm · 0.47mm/px · 1 of 46 slices shown]
[im 1/46]
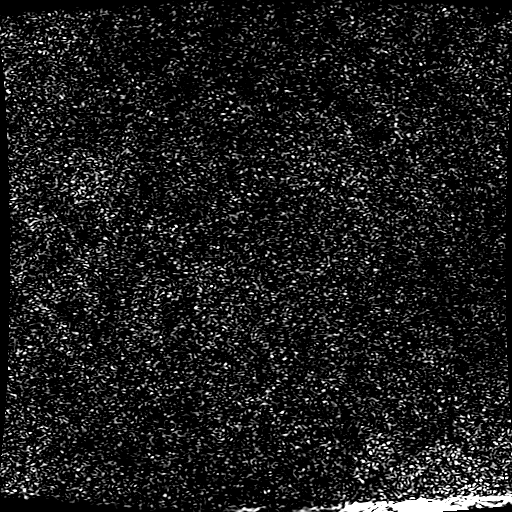

[Series 350: ADC · axial · 3.0mm · 0.94mm/px · z∈[-122,+73]mm · 2 of 66 slices shown]
[im 1/66]
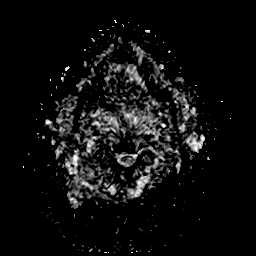
[im 66/66]
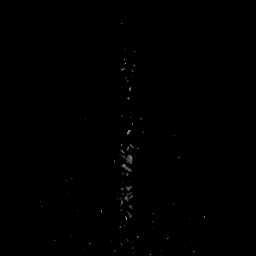

[Series 500: SWI · axial · 3.0mm · 0.47mm/px · 1 of 125 slices shown (2 of 2)]
[im 1/125]
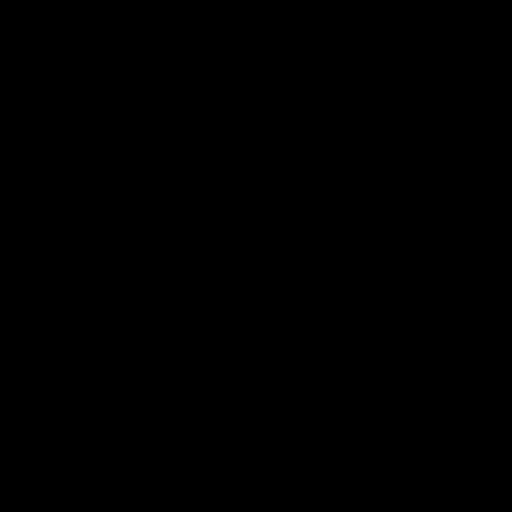

[Series 1100: T1 post-contrast · axial · 0.9mm · 0.50mm/px · z∈[-152,+103]mm · 7 of 301 slices shown (2 of 2)]
[im 1/301]
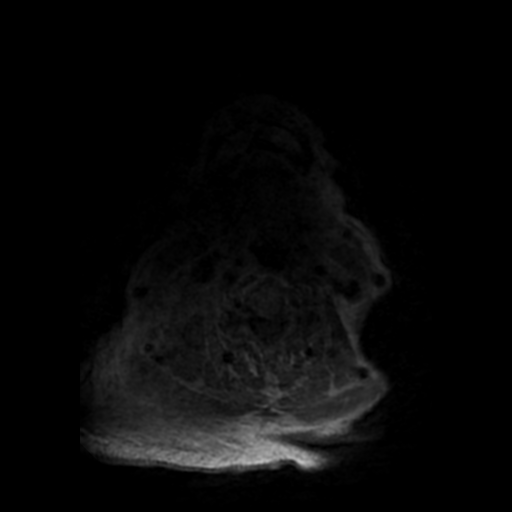
[im 51/301]
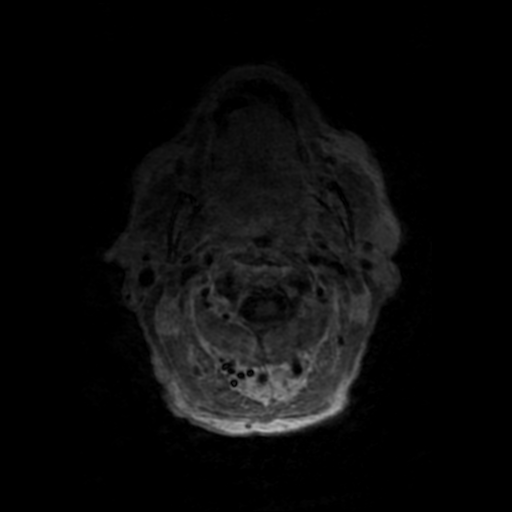
[im 101/301]
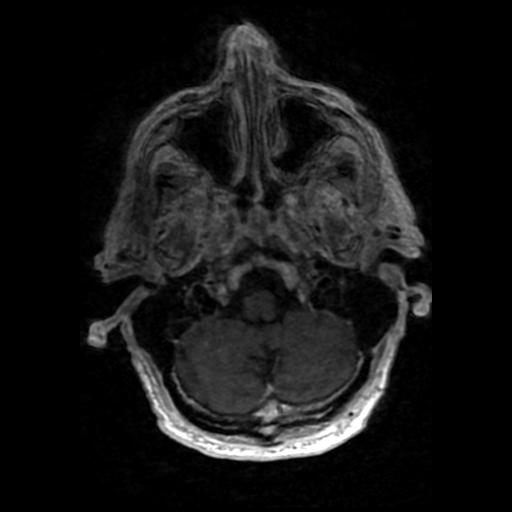
[im 151/301]
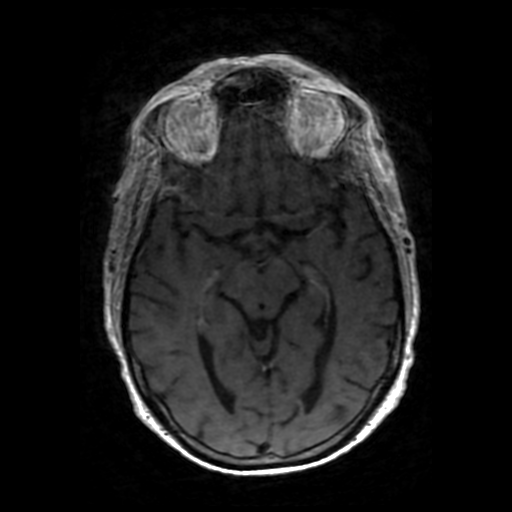
[im 201/301]
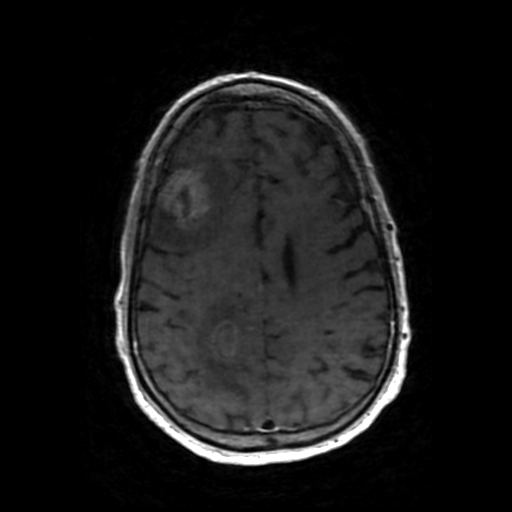
[im 251/301]
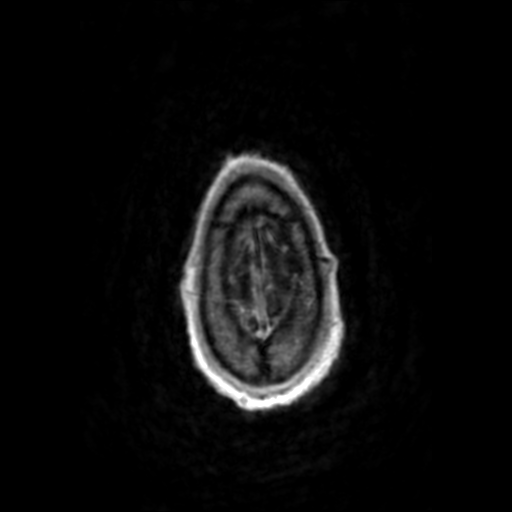
[im 301/301]
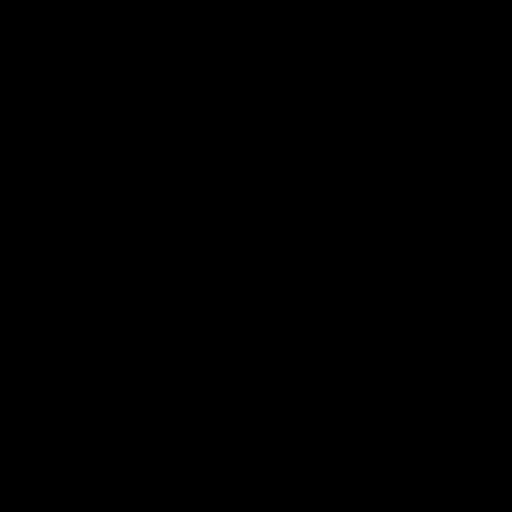

[23 of 48 positions shown; findings below may reference images not displayed]

FINDINGS: Motion artifact is present.

Brain: 7 stable parenchymal metastases are identified on series
4444, images 133, 162, 168, 192, and 217 as annotated. Larger
lesions demonstrate associated susceptibility consistent with
intralesional hemorrhage. Associated edema is similar with minor
mass effect.

No acute infarction. No hydrocephalus. Additional patchy T2
hyperintensity in the supratentorial white matter likely reflecting
stable chronic microvascular ischemic changes.

Vascular: Major vessel flow voids at the skull base are preserved.

Skull and upper cervical spine: Normal marrow signal is preserved.

Sinuses/Orbits: Paranasal sinus mucosal thickening. Orbits are
unremarkable.

Other: Sella is unremarkable.  Mastoid air cells are clear.
IMPRESSION: Stable 7 metastatic lesions. Stable associated mild edema and mass
effect.
